# Patient Record
Sex: Male | Born: 1955 | Race: White | Hispanic: No | Smoking: Never smoker
Health system: Southern US, Community
[De-identification: ages and names within clinical notes are randomized; demographics above are authoritative.]

## PROBLEM LIST (undated history)

## (undated) DIAGNOSIS — K219 Gastro-esophageal reflux disease without esophagitis: Secondary | ICD-10-CM

## (undated) DIAGNOSIS — G4700 Insomnia, unspecified: Secondary | ICD-10-CM

## (undated) DIAGNOSIS — R7303 Prediabetes: Secondary | ICD-10-CM

## (undated) DIAGNOSIS — F329 Major depressive disorder, single episode, unspecified: Secondary | ICD-10-CM

## (undated) DIAGNOSIS — E119 Type 2 diabetes mellitus without complications: Secondary | ICD-10-CM

## (undated) DIAGNOSIS — F419 Anxiety disorder, unspecified: Secondary | ICD-10-CM

## (undated) DIAGNOSIS — M1990 Unspecified osteoarthritis, unspecified site: Secondary | ICD-10-CM

## (undated) DIAGNOSIS — Z973 Presence of spectacles and contact lenses: Secondary | ICD-10-CM

## (undated) DIAGNOSIS — F32A Depression, unspecified: Secondary | ICD-10-CM

## (undated) HISTORY — PX: VASECTOMY: SHX75

## (undated) HISTORY — PX: KNEE ARTHROSCOPY W/ MENISCAL REPAIR: SHX1877

## (undated) HISTORY — DX: Unspecified osteoarthritis, unspecified site: M19.90

## (undated) HISTORY — DX: Anxiety disorder, unspecified: F41.9

## (undated) HISTORY — PX: ROTATOR CUFF REPAIR: SHX139

---

## 2000-12-27 ENCOUNTER — Ambulatory Visit (HOSPITAL_BASED_OUTPATIENT_CLINIC_OR_DEPARTMENT_OTHER): Admission: RE | Admit: 2000-12-27 | Discharge: 2000-12-27 | Payer: Self-pay | Admitting: Orthopedic Surgery

## 2002-03-14 ENCOUNTER — Encounter (INDEPENDENT_AMBULATORY_CARE_PROVIDER_SITE_OTHER): Payer: Self-pay | Admitting: Specialist

## 2002-03-14 ENCOUNTER — Ambulatory Visit (HOSPITAL_BASED_OUTPATIENT_CLINIC_OR_DEPARTMENT_OTHER): Admission: RE | Admit: 2002-03-14 | Discharge: 2002-03-14 | Payer: Self-pay | Admitting: Urology

## 2008-09-12 ENCOUNTER — Ambulatory Visit: Payer: Self-pay | Admitting: Internal Medicine

## 2009-01-09 ENCOUNTER — Ambulatory Visit: Payer: Self-pay | Admitting: Internal Medicine

## 2009-01-23 ENCOUNTER — Encounter: Payer: Self-pay | Admitting: Internal Medicine

## 2009-01-23 ENCOUNTER — Ambulatory Visit: Payer: Self-pay | Admitting: Internal Medicine

## 2009-02-02 ENCOUNTER — Encounter: Payer: Self-pay | Admitting: Internal Medicine

## 2011-05-13 NOTE — Op Note (Signed)
[place]. [facility]  Patient:    Brandon Vargas, Brandon Vargas                          MRN: [id number] Proc. Date: 12/27/00 Adm. Date:  [id number] Attending:  Milly Jakob                           Operative Report  PREOPERATIVE DIAGNOSIS:  Medial meniscus tear.  POSTOPERATIVE DIAGNOSES: 1. Medial meniscus tear. 2. Chondromalacia and femoral trochlea.  OPERATION: 1. Partial medial meniscectomy. 2. Debridement of chondromalacia of the femoral trochlea.  SURGEON:  Harvie Junior, M.D.  ASSISTANT:  Currie Paris. Thedore Mins.  ANESTHESIA:  General.  INDICATIONS:  This is a 55 year old with a long history of having had a popping type injury into this knee.  He was having intermittent locking and catching.  He was having inability to run even a short distance because of pain.  He ultimately had an injection which did not relieve his pain.  If anything, it made it a little bit worse.  He tried physical therapy with no results and because of that ultimately had an MRI done which showed that he had a radial type meniscal tear.  We talked about treatment options and ultimately he was brought to the operating room for fixation.  DESCRIPTION OF PROCEDURE:  The patient was brought to the operating room and after adequate anesthesia was obtained with general anesthesia, the patient was placed supine on the operating room table.  The leg was then prepped and draped in the usual sterile fashion and following routine arthroscopic examination, the knee revealed that there was an obvious tear of the posterior horn of the medial meniscus.  This was a radial type tear which came around to the mid body of the medial meniscus.  This was debrided with a combination of straight biting forceps, upbiting forceps and the remaining meniscal rim was c contoured down with a suction shaver.  Following this, attention was turned to the anterior cruciate which was normal.  Attention was turned to  the lateral side which was also normal.  Attention was then turned up into the patellofemoral joint where there was some tendency toward lateral tracking, certainly not dramatic but there was some fairly significant chondromalacia of the femoral trochlea.  This was debrided with a suction shaver back to a stable rim.  Following this, attention was turned back to the medial side where a final check was made to make sure there was not anything that was catching into their side and there was no loose fragment.  At this point, the knee was suctioned dry and the arthroscopic portals were closed with Steri-Strips, and a sterile compressive dressing was applied.  The patient was taken to the recovery room where she was noted to be in satisfactory condition.  Estimated blood loss for the procedure was negligible. DD:  12/27/00 TD:  12/27/00 Job: [id number]

## 2011-05-13 NOTE — Op Note (Signed)
[facility]  Patient:    Brandon Vargas, Brandon Vargas Visit Number: [id number] MRN: [id number]          Service Type: NES Location: [facility]C Attending Physician:  Laqueta Jean Dictated by:   Vonzell Schlatter Patsi Sears, M.D. Proc. Date: 03/14/02 Admit Date:  03/14/2002                             Operative Report  PREOPERATIVE DIAGNOSES:  Right scrotal mass and elective sterilization.  POSTOPERATIVE DIAGNOSES:  Right scrotal mass and elective sterilization.  OPERATION PERFORMED:  Right epididymal cystectomy, right epididymectomy, bilateral vasectomy.  SURGEON:  Sigmund I. Patsi Sears, M.D.  ANESTHESIA:  General endotracheal.  PREPARATION:  After appropriate preanesthesia, the patient was brought to the operating room and placed on the operating table in dorsal supine position where general LMA anesthesia was introduced. It was switched to endotracheal anesthesia because of the ineffectiveness of the LMA to allow ventilation of the patient. The right hemiscrotum was then prepped with Betadine solution and draped in the usual fashion.  DESCRIPTION OF PROCEDURE:  A 6 cm right hemiscrotal incision was made in the subcutaneous tissue, dissected with the electrosurgical unit. The testicle was delivered in the wound, and a very large 5 cm x 5 cm right epididymal cystic mass was identified. This required complete epididymectomy and this was accomplished with great care. The testicle was salvaged, and the blood vessels to the right testicle was identified, and saved. The entire cyst was excised, vasectomy accomplished with cauterization of the vas, and ligation with 2-0 Vicryl suture.  A Penrose drain was placed through a separate stab wound and sutured in place with 3-0 Vicryl suture. The testicle were placed in the wound and the wound was irrigated with antibiotic irrigation. The wound was then closed in two layers with 3-0 Vicryl running suture. A collodion  dressing was applied.  Attention was then directed to the left hemiscrotum, where the vas was identified in a thickened scrotum. A 1 cm left hemiscrotal incision was made, subcutaneous tissue dissected with the electrosurgical unit. The vas was then clamped and brought into the wound. Marcaine was placed in both proximal and distal portions of the vas, and the vas was dissected, and doubly clamped and a portion excised and sent to the laboratory. Each end was cauterized, ligated with 2-0 Vicryl suture. The edges of the vas were then over sewn with 3-0 Vicryl suture, the wound was closed in two layers with 3-0 Vicryl running suture. A collodion dressing was applied, the patient was then awakened and taken to the recovery room in excellent condition.  Dictated by:   Vonzell Schlatter Patsi Sears, M.D. Attending Physician:  Laqueta Jean DD:  03/14/02 TD:  03/15/02 Job: [id number]

## 2014-01-01 ENCOUNTER — Encounter: Payer: Self-pay | Admitting: Internal Medicine

## 2014-07-10 ENCOUNTER — Encounter: Payer: Self-pay | Admitting: Internal Medicine

## 2014-09-24 ENCOUNTER — Encounter: Payer: Self-pay | Admitting: Internal Medicine

## 2014-11-25 ENCOUNTER — Encounter: Payer: Self-pay | Admitting: Internal Medicine

## 2014-12-26 HISTORY — PX: COLONOSCOPY: SHX174

## 2014-12-31 ENCOUNTER — Ambulatory Visit: Payer: BLUE CROSS/BLUE SHIELD | Admitting: *Deleted

## 2014-12-31 VITALS — Ht 73.0 in | Wt 191.0 lb

## 2014-12-31 DIAGNOSIS — Z860100 Personal history of colon polyps, unspecified: Secondary | ICD-10-CM

## 2014-12-31 DIAGNOSIS — Z800 Family history of malignant neoplasm of digestive organs: Secondary | ICD-10-CM

## 2014-12-31 MED ORDER — MOVIPREP 100 G PO SOLR: ORAL | Status: DC

## 2014-12-31 NOTE — Progress Notes (Signed)
Patient denies any allergies to eggs or soy. Patient denies any problems with anesthesia/sedation. Patient denies any oxygen use at home and does not take any diet/weight loss medications. EMMI education assisgned to patient on colonoscopy, this was explained and instructions given to patient. 

## 2015-01-12 ENCOUNTER — Telehealth: Payer: Self-pay | Admitting: Internal Medicine

## 2015-01-12 NOTE — Telephone Encounter (Signed)
Spoke with pt, who states he was given a "4 liter container with powder already mixed into it." There is not a one liter container, with 2 A and B packets.  I spoke with [facility], who states that pt was given Go-Lytely instead of Moviprep.  She will order the correct prep and pt can bring back Go-Lytely and get Moviprep  I spoke with pt and he is aware to pick up Moviprep after 3:00 tomorrow

## 2015-01-13 ENCOUNTER — Encounter: Payer: Self-pay | Admitting: Internal Medicine

## 2015-01-14 ENCOUNTER — Ambulatory Visit (AMBULATORY_SURGERY_CENTER): Payer: BLUE CROSS/BLUE SHIELD | Admitting: Internal Medicine

## 2015-01-14 ENCOUNTER — Encounter: Payer: Self-pay | Admitting: Internal Medicine

## 2015-01-14 VITALS — BP 109/70 | HR 59 | Temp 95.7°F | Resp 30 | Ht 73.0 in | Wt 191.0 lb

## 2015-01-14 DIAGNOSIS — Z800 Family history of malignant neoplasm of digestive organs: Secondary | ICD-10-CM

## 2015-01-14 DIAGNOSIS — D120 Benign neoplasm of cecum: Secondary | ICD-10-CM

## 2015-01-14 DIAGNOSIS — Z86010 Personal history of colonic polyps: Secondary | ICD-10-CM

## 2015-01-14 MED ORDER — SODIUM CHLORIDE 0.9 % IV SOLN: 500.0000 mL | INTRAVENOUS | Status: DC

## 2015-01-14 NOTE — Op Note (Signed)
[place]  [address]. [place], [address]   COLONOSCOPY PROCEDURE REPORT  PATIENT: Brandon Vargas, Brandon Vargas  MR#: [id number] BIRTHDATE: September 11, 1956 , 58  yrs. old GENDER: male ENDOSCOPIST: Lafayette Dragon, MD REFERRED FI:EPPIRJJ Aguiar, M.D. PROCEDURE DATE:  01/14/2015 PROCEDURE:   Colonoscopy with snare polypectomy First Screening Colonoscopy - Avg.  risk and is 50 yrs.  old or older - No.  Prior Negative Screening - Now for repeat screening. N/A  History of Adenoma - Now for follow-up colonoscopy & has been > or = to 3 yrs.  Yes hx of adenoma.  Has been 3 or more years since last colonoscopy.  Polyps Removed Today? Yes. ASA CLASS:   Class I INDICATIONS:adenomatous polyps removed in 1999, 2004 and in January 2010.  Positive family history of colon cancer in patient's father.  MEDICATIONS: Monitored anesthesia care and Propofol 200 mg IV  DESCRIPTION OF PROCEDURE:   After the risks benefits and alternatives of the procedure were thoroughly explained, informed consent was obtained.  The digital rectal exam revealed no abnormalities of the rectum.   The LB [id number]  endoscope was introduced through the anus and advanced to the cecum, which was identified by both the appendix and ileocecal valve. No adverse events experienced.   The quality of the prep was good, using MoviPrep  The instrument was then slowly withdrawn as the colon was fully examined.      COLON FINDINGS: A polypoid shaped sessile polyp measuring 8 mm in size was found at the cecum.  A polypectomy was performed with a cold snare.  Retroflexed views revealed no abnormalities. The time to cecum=4 minutes 43 seconds.  Withdrawal time=6 minutes 19 seconds.  The scope was withdrawn and the procedure completed. COMPLICATIONS: There were no immediate complications.  ENDOSCOPIC IMPRESSION: Sessile polyp was found at the cecum; polypectomy was performed with a cold snare  RECOMMENDATIONS: 1.  Await  pathology results 2.  High-fiber diet recall colonoscopy in 5 years  eSigned:  Lafayette Dragon, MD 01/14/2015 12:04 PM   cc:

## 2015-01-14 NOTE — Progress Notes (Signed)
Called to room to assist during endoscopic procedure.  Patient ID and intended procedure confirmed with present staff. Received instructions for my participation in the procedure from the performing physician.  

## 2015-01-14 NOTE — Progress Notes (Signed)
A/ox3, pleased with MAC, report to RN 

## 2015-01-14 NOTE — Patient Instructions (Signed)
Discharge instructions given. Handout on polyps. Resume previous medications. YOU HAD AN ENDOSCOPIC PROCEDURE TODAY AT [facility]: Refer to the procedure report that was given to you for any specific questions about what was found during the examination.  If the procedure report does not answer your questions, please call your gastroenterologist to clarify.  If you requested that your care partner not be given the details of your procedure findings, then the procedure report has been included in a sealed envelope for you to review at your convenience later.  YOU SHOULD EXPECT: Some feelings of bloating in the abdomen. Passage of more gas than usual.  Walking can help get rid of the air that was put into your GI tract during the procedure and reduce the bloating. If you had a lower endoscopy (such as a colonoscopy or flexible sigmoidoscopy) you may notice spotting of blood in your stool or on the toilet paper. If you underwent a bowel prep for your procedure, then you may not have a normal bowel movement for a few days.  DIET: Your first meal following the procedure should be a light meal and then it is ok to progress to your normal diet.  A half-sandwich or bowl of soup is an example of a good first meal.  Heavy or fried foods are harder to digest and may make you feel nauseous or bloated.  Likewise meals heavy in dairy and vegetables can cause extra gas to form and this can also increase the bloating.  Drink plenty of fluids but you should avoid alcoholic beverages for 24 hours.  ACTIVITY: Your care partner should take you home directly after the procedure.  You should plan to take it easy, moving slowly for the rest of the day.  You can resume normal activity the day after the procedure however you should NOT DRIVE or use heavy machinery for 24 hours (because of the sedation medicines used during the test).    SYMPTOMS TO REPORT IMMEDIATELY: A gastroenterologist can be reached at any  hour.  During normal business hours, 8:30 AM to 5:00 PM Monday through Friday, call [phone].  After hours and on weekends, please call the GI answering service at [phone] who will take a message and have the physician on call contact you.   Following lower endoscopy (colonoscopy or flexible sigmoidoscopy):  Excessive amounts of blood in the stool  Significant tenderness or worsening of abdominal pains  Swelling of the abdomen that is new, acute  Fever of 100F or higher  FOLLOW UP: If any biopsies were taken you will be contacted by phone or by letter within the next 1-3 weeks.  Call your gastroenterologist if you have not heard about the biopsies in 3 weeks.  Our staff will call the home number listed on your records the next business day following your procedure to check on you and address any questions or concerns that you may have at that time regarding the information given to you following your procedure. This is a courtesy call and so if there is no answer at the home number and we have not heard from you through the emergency physician on call, we will assume that you have returned to your regular daily activities without incident.  SIGNATURES/CONFIDENTIALITY: You and/or your care partner have signed paperwork which will be entered into your electronic medical record.  These signatures attest to the fact that that the information above on your After Visit Summary has been reviewed and is  understood.  Full responsibility of the confidentiality of this discharge information lies with you and/or your care-partner.

## 2015-01-15 ENCOUNTER — Telehealth: Payer: Self-pay

## 2015-01-15 NOTE — Telephone Encounter (Signed)
  Follow up Call-  Call back number 01/14/2015  Post procedure Call Back phone  # [phone]  Permission to leave phone message Yes     Patient questions:  Do you have a fever, pain , or abdominal swelling? No. Pain Score  0 *  Have you tolerated food without any problems? Yes.    Have you been able to return to your normal activities? Yes.    Do you have any questions about your discharge instructions: Diet   No. Medications  No. Follow up visit  No.  Do you have questions or concerns about your Care? No.  Actions: * If pain score is 4 or above: No action needed, pain <4.

## 2015-01-21 ENCOUNTER — Encounter: Payer: Self-pay | Admitting: Internal Medicine

## 2017-04-17 DIAGNOSIS — F419 Anxiety disorder, unspecified: Secondary | ICD-10-CM | POA: Insufficient documentation

## 2017-04-17 DIAGNOSIS — N400 Enlarged prostate without lower urinary tract symptoms: Secondary | ICD-10-CM | POA: Insufficient documentation

## 2017-05-29 DIAGNOSIS — R7301 Impaired fasting glucose: Secondary | ICD-10-CM | POA: Insufficient documentation

## 2017-05-29 DIAGNOSIS — R739 Hyperglycemia, unspecified: Secondary | ICD-10-CM | POA: Insufficient documentation

## 2017-05-30 ENCOUNTER — Encounter (HOSPITAL_BASED_OUTPATIENT_CLINIC_OR_DEPARTMENT_OTHER): Payer: Self-pay | Admitting: Respiratory Therapy

## 2017-05-30 ENCOUNTER — Emergency Department (HOSPITAL_BASED_OUTPATIENT_CLINIC_OR_DEPARTMENT_OTHER)
Admission: EM | Admit: 2017-05-30 | Discharge: 2017-05-30 | Disposition: A | Discharge status: Home / Self Care | Payer: 59 | Attending: Emergency Medicine | Admitting: Emergency Medicine

## 2017-05-30 DIAGNOSIS — R05 Cough: Secondary | ICD-10-CM

## 2017-05-30 DIAGNOSIS — R059 Cough, unspecified: Secondary | ICD-10-CM

## 2017-05-30 DIAGNOSIS — Z79899 Other long term (current) drug therapy: Secondary | ICD-10-CM | POA: Insufficient documentation

## 2017-05-30 HISTORY — DX: Depression, unspecified: F32.A

## 2017-05-30 HISTORY — DX: Major depressive disorder, single episode, unspecified: F32.9

## 2017-05-30 HISTORY — DX: Insomnia, unspecified: G47.00

## 2017-05-30 MED ORDER — BENZONATATE 100 MG PO CAPS
100.0000 mg | ORAL_CAPSULE | Freq: Once | ORAL | Status: AC
  Administered 2017-05-30: 100 mg via ORAL
  Filled 2017-05-30: qty 1

## 2017-05-30 MED ORDER — ALBUTEROL SULFATE HFA 108 (90 BASE) MCG/ACT IN AERS
2.0000 | INHALATION_SPRAY | Freq: Four times a day (QID) | RESPIRATORY_TRACT | Status: DC | PRN
  Administered 2017-05-30: 2 via RESPIRATORY_TRACT
  Filled 2017-05-30: qty 6.7

## 2017-05-30 MED ORDER — BENZONATATE 100 MG PO CAPS: 100.0000 mg | ORAL_CAPSULE | Freq: Three times a day (TID) | ORAL | 0 refills | Status: DC | PRN

## 2017-05-30 NOTE — ED Triage Notes (Signed)
Dry "tight" cough x6 days. Denies fever or any other URI sx.

## 2017-05-30 NOTE — ED Notes (Signed)
ED Provider at bedside. 

## 2017-05-30 NOTE — ED Provider Notes (Signed)
[place] DEPT [facility] Provider Note: Georgena Spurling, MD, FACEP  CSN: [id number] MRN: [id number] ARRIVAL: 05/30/17 at 0513 ROOM: [place]  Cough   HISTORY OF PRESENT ILLNESS  Brandon Vargas is a 61 y.o. male with a six-day history of cough. The cough occurs in paroxysms which can last several minutes or more. He describes these paroxysms of severe. He describes the cough as feeling like a tightness in the center of his chest. He has had some occasional wheezing but is not significantly short of breath except during the paroxysms. The cough is nonproductive. He has had no fever or cold symptoms. He has had no nausea, vomiting or diarrhea. He is not having chest pain apart from some mild bilateral lower rib soreness with the cough. He tried Delsym and Robitussin over-the-counter cough suppressants without relief. He saw his physician yesterday and was prescribed Tussionex for use at night. This has not provided adequate relief.   Past Medical History:  Diagnosis Date  . Depression   . Insomnia     Past Surgical History:  Procedure Laterality Date  . KNEE ARTHROSCOPY W/ MENISCAL REPAIR Left   . ROTATOR CUFF REPAIR    . VASECTOMY      Family History  Problem Relation Age of Onset  . Colon cancer Father 24    Social History  Substance Use Topics  . Smoking status: Never Smoker  . Smokeless tobacco: Never Used  . Alcohol use Yes     Comment: Occ beer    Prior to Admission medications   Medication Sig Start Date End Date Taking? Authorizing Provider  Hydrocodone-Chlorpheniramine 5-4 MG/5ML SOLN Take 5 mLs by mouth at bedtime.   Yes [provider]  Multiple Vitamin (MULTIVITAMIN) tablet Take 1 tablet by mouth daily.    [provider]  Multiple Vitamins-Minerals (ZINC PO) Take 1 tablet by mouth daily.    [provider]  Saw Palmetto, Serenoa repens, (SAW PALMETTO PO) Take 1 tablet by mouth daily.    [provider]     Allergies Patient has no known allergies.   REVIEW OF SYSTEMS  Negative except as noted here or in the History of Present Illness.   PHYSICAL EXAMINATION  Initial Vital Signs Blood pressure (!) 142/94, pulse (!) 59, temperature 98 F (36.7 C), temperature source Oral, resp. rate 16, height 6\' 1"  (1.854 m), weight 93.4 kg (206 lb), SpO2 100 %.  Examination General: Well-developed, well-nourished male in no acute distress; appearance consistent with age of record HENT: normocephalic; atraumatic Eyes: pupils equal, round and reactive to light; extraocular muscles intact Neck: supple Heart: regular rate and rhythm Lungs: clear to auscultation bilaterally; frequent dry cough Abdomen: soft; nondistended; nontender; no masses or hepatosplenomegaly; bowel sounds present Extremities: No deformity; full range of motion; pulses normal Neurologic: Awake, alert and oriented; motor function intact in all extremities and symmetric; no facial droop Skin: Warm and dry Psychiatric: Normal mood and affect   RESULTS  Summary of this visit's results, reviewed by myself:   EKG Interpretation  Date/Time:    Ventricular Rate:    PR Interval:    QRS Duration:   QT Interval:    QTC Calculation:   R Axis:     Text Interpretation:        Laboratory Studies: No results found for this or any previous visit (from the past 24 hour(s)). Imaging Studies: No results found.  ED COURSE  Nursing notes and initial vitals signs, including  pulse oximetry, reviewed.  Vitals:   05/30/17 0527 05/30/17 0545  BP: (!) 142/94   Pulse: (!) 59   Resp: 16   Temp: 98 F (36.7 C)   TempSrc: Oral   SpO2: 100% 98%  Weight: 93.4 kg (206 lb)   Height: 6\' 1"  (1.854 m)    5:57 AM Patient given an albuterol inhaler and AeroChamber and instructed in their use by respiratory therapy. We will also prescribe Tessalon Perles. He was advised to discontinue Robitussin or any other over-the-counter cough  suppressant while taking Tussionex. He was advised to take the Tussionex every 12 hours. We will thus be treating him with repeat different classes of drug, albuterol, a narcotic cough suppressant and local anesthetic. He was advised that this pretty much is the limit to our armamentarium against cough. He plans to follow-up with his PCP in 2 days if his cough persists.  PROCEDURES    ED DIAGNOSES     ICD-9-CM ICD-10-CM   1. Cough 786.2 R05        Caylie Sandquist, Jenny Reichmann, MD 05/30/17 [phone]

## 2017-12-24 DIAGNOSIS — B356 Tinea cruris: Secondary | ICD-10-CM | POA: Insufficient documentation

## 2018-04-09 DIAGNOSIS — R972 Elevated prostate specific antigen [PSA]: Secondary | ICD-10-CM | POA: Insufficient documentation

## 2018-11-02 DIAGNOSIS — R269 Unspecified abnormalities of gait and mobility: Secondary | ICD-10-CM | POA: Insufficient documentation

## 2018-11-02 DIAGNOSIS — R2689 Other abnormalities of gait and mobility: Secondary | ICD-10-CM | POA: Insufficient documentation

## 2019-01-02 ENCOUNTER — Encounter: Payer: Self-pay | Admitting: Neurology

## 2019-03-01 NOTE — Progress Notes (Deleted)
[place] [facility] Clinic Note - Initial Visit   Date: 03/01/19  [place] Brandon Vargas MRN: [id number] DOB: Feb 08, 1956   Dear Dr. Ruben Gottron:  Thank you for your kind referral of Brandon Vargas for consultation of imbanace. Although his history is well known to you, please allow [place] to reiterate it for the purpose of our medical record. The patient was accompanied to the clinic by *** who also provides collateral information.     History of Present Illness: Brandon Vargas is a 63 y.o. ***-handed Caucasian/*** male with well-controlled diabetes mellitus, anxiety, and insomnia presenting for evaluation of imbalance.   Starting around the summer of 2019, he began having sensation of imbalance described as ***.  He has suffered *** falls.   CT head on 10/29/2018 is normal. He was hesitant to proceed with neurology referral due to anxiety of having a MRI brain and having claustrophobia.   Out-side paper records, electronic medical record, and images have been reviewed where available and summarized as:  Labs 10/03/2018:  HbA1c 6.5, TSH 1.07 CT head 10/29/2018:  Negative noncontrast head CT.   Past Medical History:  Diagnosis Date  . Depression   . Insomnia     Past Surgical History:  Procedure Laterality Date  . KNEE ARTHROSCOPY W/ MENISCAL REPAIR Left   . ROTATOR CUFF REPAIR    . VASECTOMY       Medications:  Outpatient Encounter Medications as of 03/04/2019  Medication Sig  . benzonatate (TESSALON) 100 MG capsule Take 1 capsule (100 mg total) by mouth 3 (three) times daily as needed for cough (swallow whole).  . Hydrocodone-Chlorpheniramine 5-4 MG/5ML SOLN Take 5 mLs by mouth at bedtime.   No facility-administered encounter medications on file as of 03/04/2019.     Allergies: No Known Allergies  Family History: Family History  Problem Relation Age of Onset  . Colon cancer Father 47    Social History: Social History   Tobacco Use  . Smoking status: Never Smoker    . Smokeless tobacco: Never Used  Substance Use Topics  . Alcohol use: Yes    Comment: Occ beer  . Drug use: No   Social History   Social History Narrative  . Not on file    Review of Systems:  CONSTITUTIONAL: No fevers, chills, night sweats, or weight loss.  *** EYES: No visual changes or eye pain ENT: No hearing changes.  No history of nose bleeds.   RESPIRATORY: No cough, wheezing and shortness of breath.   CARDIOVASCULAR: Negative for chest pain, and palpitations.   GI: Negative for abdominal discomfort, blood in stools or black stools.  No recent change in bowel habits.   GU:  No history of incontinence.   MUSCLOSKELETAL: No history of joint pain or swelling.  No myalgias.   SKIN: Negative for lesions, rash, and itching.   HEMATOLOGY/ONCOLOGY: Negative for prolonged bleeding, bruising easily, and swollen nodes.  No history of cancer.   ENDOCRINE: Negative for cold or heat intolerance, polydipsia or goiter.   PSYCH:  ***depression or anxiety symptoms.   NEURO: As Above.   Vital Signs:  There were no vitals taken for this visit.   General Medical Exam:  *** General:  Well appearing, comfortable.   Eyes/ENT: see cranial nerve examination.   Neck:   No carotid bruits. Respiratory:  Clear to auscultation, good air entry bilaterally.   Cardiac:  Regular rate and rhythm, no murmur.   Extremities:  No deformities, edema, or skin discoloration.  Skin:  No rashes or lesions.  Neurological Exam: MENTAL STATUS including orientation to time, place, person, recent and remote memory, attention span and concentration, language, and fund of knowledge is ***normal.  Speech is not dysarthric.  CRANIAL NERVES: II:  No visual field defects.  Unremarkable fundi.   III-IV-VI: Pupils equal round and reactive to light.  Normal conjugate, extra-ocular eye movements in all directions of gaze.  No nystagmus.  No ptosis***.   V:  Normal facial sensation.    VII:  Normal facial symmetry and  movements.   VIII:  Normal hearing and vestibular function.   IX-X:  Normal palatal movement.   XI:  Normal shoulder shrug and head rotation.   XII:  Normal tongue strength and range of motion, no deviation or fasciculation.  MOTOR:  No atrophy, fasciculations or abnormal movements.  No pronator drift.   Upper Extremity:  Right  Left  Deltoid  5/5   5/5   Biceps  5/5   5/5   Triceps  5/5   5/5   Infraspinatus 5/5  5/5  Medial pectoralis 5/5  5/5  Wrist extensors  5/5   5/5   Wrist flexors  5/5   5/5   Finger extensors  5/5   5/5   Finger flexors  5/5   5/5   Dorsal interossei  5/5   5/5   Abductor pollicis  5/5   5/5   Tone (Ashworth scale)  0  0   Lower Extremity:  Right  Left  Hip flexors  5/5   5/5   Hip extensors  5/5   5/5   Adductor 5/5  5/5  Abductor 5/5  5/5  Knee flexors  5/5   5/5   Knee extensors  5/5   5/5   Dorsiflexors  5/5   5/5   Plantarflexors  5/5   5/5   Toe extensors  5/5   5/5   Toe flexors  5/5   5/5   Tone (Ashworth scale)  0  0   MSRs:  Right                                                                 Left brachioradialis 2+  brachioradialis 2+  biceps 2+  biceps 2+  triceps 2+  triceps 2+  patellar 2+  patellar 2+  ankle jerk 2+  ankle jerk 2+  Hoffman no  Hoffman no  plantar response down  plantar response down   SENSORY:  Normal and symmetric perception of light touch, pinprick, vibration, and proprioception.  Romberg's sign absent.   COORDINATION/GAIT: Normal finger-to- nose-finger and heel-to-shin.  Intact rapid alternating movements bilaterally.  Able to rise from a chair without using arms.  Gait narrow based and stable. Tandem and stressed gait intact.    IMPRESSION: ***  PLAN/RECOMMENDATIONS:  *** Return to clinic in *** months.    Thank you for allowing me to participate in patient's care.  If I can answer any additional questions, I would be pleased to do so.    Sincerely,    Donika K. Posey Pronto, DO

## 2019-03-04 ENCOUNTER — Ambulatory Visit: Payer: BLUE CROSS/BLUE SHIELD | Admitting: Neurology

## 2019-03-18 ENCOUNTER — Ambulatory Visit: Payer: BLUE CROSS/BLUE SHIELD | Admitting: Neurology

## 2019-05-27 ENCOUNTER — Ambulatory Visit (INDEPENDENT_AMBULATORY_CARE_PROVIDER_SITE_OTHER): Payer: Medicare Other | Admitting: Neurology

## 2019-05-27 ENCOUNTER — Encounter: Payer: Self-pay | Admitting: Neurology

## 2019-05-27 ENCOUNTER — Other Ambulatory Visit: Payer: Self-pay | Admitting: Neurology

## 2019-05-27 ENCOUNTER — Other Ambulatory Visit: Payer: Self-pay

## 2019-05-27 ENCOUNTER — Telehealth: Payer: Self-pay

## 2019-05-27 ENCOUNTER — Other Ambulatory Visit: Payer: Medicare Other

## 2019-05-27 VITALS — BP 118/82 | HR 57 | Temp 97.9°F | Ht 73.0 in | Wt 196.0 lb

## 2019-05-27 DIAGNOSIS — R6889 Other general symptoms and signs: Secondary | ICD-10-CM

## 2019-05-27 DIAGNOSIS — R292 Abnormal reflex: Secondary | ICD-10-CM

## 2019-05-27 DIAGNOSIS — G959 Disease of spinal cord, unspecified: Secondary | ICD-10-CM

## 2019-05-27 DIAGNOSIS — R258 Other abnormal involuntary movements: Secondary | ICD-10-CM

## 2019-05-27 NOTE — Patient Instructions (Addendum)
MRI brain and cervical spine without contrast will be order under anesthesia    Check labs Your provider has requested that you have labwork completed today. Please go to [facility] ([address]) on the second floor of this building before leaving the office today. You do not need to check in. If you are not called within 15 minutes please check with the front desk.    We will call you with the results

## 2019-05-27 NOTE — Progress Notes (Signed)
[facility] Clinic Note - Initial Visit   Date: 05/27/19  Brandon Vargas MRN: [id number] DOB: 02-11-1956   Dear Dr. Ruben Gottron:  Thank you for your kind referral of Brandon Vargas for consultation of imbalance. Although his history is well known to you, please allow [place] to reiterate it for the purpose of our medical record. The patient was accompanied to the clinic by self.   History of Present Illness: Brandon Vargas is a 63 y.o. right-handed Caucasian male with diabetes mellitus (HbA1c 6.5), and anxiety/depression presenting for evaluation of gait imbalance.   Starting around 65-month ago, he began having sensation of unsteadiness with walking, which is worse when he first wakes up.  He walks unassisted and has not suffered any falls.  Symptoms are episodic throughout the day, with no exacerbating or alleviating factors.  He does not have leg weakness, arm weakness, muscle cramps, or bowel/bladder problems.  CT head from 11/08/2018 was unremarkable.  He has severe claustrophobia and is not interested in having MRI.  Past Medical History:  Diagnosis Date  . Depression   . Insomnia     Past Surgical History:  Procedure Laterality Date  . KNEE ARTHROSCOPY W/ MENISCAL REPAIR Left   . ROTATOR CUFF REPAIR    . VASECTOMY       Medications:  Outpatient Encounter Medications as of 05/27/2019  Medication Sig  . Hydrocodone-Chlorpheniramine 5-4 MG/5ML SOLN Take 5 mLs by mouth at bedtime.  . sertraline (ZOLOFT) 100 MG tablet Take 100 mg by mouth as directed. Take 532mdaily  . [DISCONTINUED] benzonatate (TESSALON) 100 MG capsule Take 1 capsule (100 mg total) by mouth 3 (three) times daily as needed for cough (swallow whole).   No facility-administered encounter medications on file as of 05/27/2019.     Allergies: No Known Allergies  Family History: Family History  Problem Relation Age of Onset  . Lung cancer Mother   . Colon cancer Father 4842. Diabetes Father    . Cancer - Colon Father   . Diabetes Sister   . Diabetes Brother   . Lung cancer Brother     Social History: Social History   Tobacco Use  . Smoking status: Never Smoker  . Smokeless tobacco: Never Used  Substance Use Topics  . Alcohol use: Yes    Comment: 1-2 beer a few times per month  . Drug use: Yes    Types: Marijuana   Social History   Social History Narrative   Right handed   Lives in a two story home with 1 son   He is retired from working as a PaLicensed conveyancer    Review of Systems:  CONSTITUTIONAL: No fevers, chills, night sweats, or weight loss.   EYES: No visual changes or eye pain ENT: No hearing changes.  No history of nose bleeds.   RESPIRATORY: No cough, wheezing and shortness of breath.   CARDIOVASCULAR: Negative for chest pain, and palpitations.   GI: Negative for abdominal discomfort, blood in stools or black stools.  No recent change in bowel habits.   GU:  No history of incontinence.   MUSCLOSKELETAL: No history of joint pain or swelling.  No myalgias.   SKIN: Negative for lesions, rash, and itching.   HEMATOLOGY/ONCOLOGY: Negative for prolonged bleeding, bruising easily, and swollen nodes.  No history of cancer.   ENDOCRINE: Negative for cold or heat intolerance, polydipsia or goiter.   PSYCH:  +depression or anxiety symptoms.   NEURO: As  Above.   Vital Signs:  BP 118/82   Pulse (!) 57   Temp 97.9 F (36.6 C)   Ht 6' 1" (1.854 m)   Wt 196 lb (88.9 kg)   SpO2 99%   BMI 25.86 kg/m    General Medical Exam:   General:  Depressed appearing, comfortable.   Eyes/ENT: see cranial nerve examination.   Neck:   No carotid bruits. Respiratory:  Clear to auscultation, good air entry bilaterally.   Cardiac:  Regular rate and rhythm, no murmur.   Extremities:  No deformities, edema, or skin discoloration.  Skin:  No rashes or lesions.  Neurological Exam: MENTAL STATUS including orientation to time, place, person, recent and remote memory,  attention span and concentration, language, and fund of knowledge is normal.  Speech is not dysarthric.  Paucity of speech.   CRANIAL NERVES: II:  No visual field defects.  Unremarkable fundi.   III-IV-VI: Pupils equal round and reactive to light.  Normal conjugate, extra-ocular eye movements in all directions of gaze, except restricted upgaze bilaterally.  No nystagmus.  No ptosis.   V:  Normal facial sensation.    VII:  Normal facial symmetry and movements.   VIII:  Normal hearing and vestibular function.   IX-X:  Normal palatal movement.   XI:  Normal shoulder shrug and head rotation.   XII:  Normal tongue strength and range of motion, no deviation or fasciculation.  MOTOR:  No atrophy, fasciculations or abnormal movements.  No pronator drift.   Upper Extremity:  Right  Left  Deltoid  5/5   5/5   Biceps  5/5   5/5   Triceps  5/5   5/5   Infraspinatus 5/5  5/5  Medial pectoralis 5/5  5/5  Wrist extensors  5/5   5/5   Wrist flexors  5/5   5/5   Finger extensors  5/5   5/5   Finger flexors  5/5   5/5   Dorsal interossei  5/5   5/5   Abductor pollicis  5/5   5/5   Tone (Ashworth scale)  0+  0+   Lower Extremity:  Right  Left  Hip flexors  5/5   5/5   Hip extensors  5/5   5/5   Adductor 5/5  5/5  Abductor 5/5  5/5  Knee flexors  5/5   5/5   Knee extensors  5/5   5/5   Dorsiflexors  5/5   5/5   Plantarflexors  5/5   5/5   Toe extensors  5/5   5/5   Toe flexors  5/5   5/5   Tone (Ashworth scale)  0+  0+   MSRs:  Right        Left                  brachioradialis 1+  1+  biceps 3+  3+  triceps 3+  3+  patellar 3+  3+  ankle jerk 3+  3+  Hoffman yes  yes  plantar response up  up  Clonus is present in the feet and hands bilaterally  SENSORY:  Normal and symmetric perception of light touch, pinprick, vibration, and proprioception.    COORDINATION/GAIT: Normal finger-to- nose-finger.  Intact rapid alternating movements bilaterally.  Gait appears mildly wide based, slow,  stable.     IMPRESSION: Gait instability in the setting of prominent upper motor neuron findings (spasticity, clonus, hyperreflexia) warrants evaluation for structural disease of the cervical cord and  brain.   PLAN/RECOMMENDATIONS:  MRI brain and cervical spine without contrast.  Due to severity of claustrophobia, he will require general anesthesia Check ESR, CRP, copper, vitamin 12, zinc, MMA for myelopathy Further recommendations pending results.   Thank you for allowing me to participate in patient's care.  If I can answer any additional questions, I would be pleased to do so.    Sincerely,    Ivy Meriwether K. Posey Pronto, DO

## 2019-05-27 NOTE — Addendum Note (Signed)
Addended by: Kaylyn Lim I on: 05/27/2019 03:58 PM   Modules accepted: Orders

## 2019-05-27 NOTE — Telephone Encounter (Signed)
[facility] orders for Neurology.

## 2019-05-27 NOTE — Addendum Note (Signed)
Addended by: Kaylyn Lim I on: 05/27/2019 04:04 PM   Modules accepted: Orders

## 2019-05-27 NOTE — Addendum Note (Signed)
Addended by: Kaylyn Lim I on: 05/27/2019 04:00 PM   Modules accepted: Orders

## 2019-05-27 NOTE — Addendum Note (Signed)
Addended by: Kaylyn Lim I on: 05/27/2019 04:08 PM   Modules accepted: Orders

## 2019-05-27 NOTE — Addendum Note (Signed)
Addended by: Kaylyn Lim I on: 05/27/2019 04:25 PM   Modules accepted: Orders

## 2019-05-28 LAB — SEDIMENTATION RATE: Sed Rate: 2 mm/h (ref 0–20)

## 2019-05-28 LAB — VITAMIN B12: Vitamin B-12: 365 pg/mL (ref 200–1100)

## 2019-05-28 LAB — C-REACTIVE PROTEIN: CRP: 1.3 mg/L (ref <8.0)

## 2019-05-29 LAB — COPPER, SERUM: Copper: 99 ug/dL (ref 70–175)

## 2019-05-31 LAB — METHYLMALONIC ACID, SERUM: Methylmalonic Acid, Quant: 132 nmol/L (ref 87–318)

## 2019-06-06 LAB — ZINC: Zinc: 95 ug/dL (ref 60–130)

## 2019-06-10 DIAGNOSIS — Z13220 Encounter for screening for lipoid disorders: Secondary | ICD-10-CM | POA: Diagnosis not present

## 2019-06-10 DIAGNOSIS — Z0000 Encounter for general adult medical examination without abnormal findings: Secondary | ICD-10-CM | POA: Diagnosis not present

## 2019-06-10 DIAGNOSIS — Z125 Encounter for screening for malignant neoplasm of prostate: Secondary | ICD-10-CM | POA: Diagnosis not present

## 2019-06-14 ENCOUNTER — Telehealth: Payer: Self-pay | Admitting: Neurology

## 2019-06-14 NOTE — Telephone Encounter (Signed)
Tasha at [facility] called to inform Brandon Vargas pt refused MRI with anesthesia appts at 6 am for 8 am. This is the time they do MRI with anesthia.

## 2019-06-17 NOTE — Telephone Encounter (Signed)
I had offered him open MRI with valium, which he refused.  He was only willing to do MRI under anesthesia.  If he cannot have the imaging done at the times available, options are limited.    We can order CT cervical spine wo contrast.  Please explain that CT will give limited results and looks and looks at bones, moreso than nerves, but that is the only other testing to assess the spinal cord.

## 2019-06-17 NOTE — Telephone Encounter (Signed)
Dr. Posey Pronto,  Can the pt take something prior to the scan to help him relax? I will call him and make sure he has a driver if you think that would help.

## 2019-06-18 NOTE — Telephone Encounter (Signed)
Spoke with pt.  He does not want to do any scans at this time. He states that he will do with out.  I explained to him his options again and he does not want to proceed with any test.

## 2019-06-18 NOTE — Telephone Encounter (Signed)
Noted  

## 2019-07-02 DIAGNOSIS — E1165 Type 2 diabetes mellitus with hyperglycemia: Secondary | ICD-10-CM | POA: Diagnosis not present

## 2019-07-02 DIAGNOSIS — R7301 Impaired fasting glucose: Secondary | ICD-10-CM | POA: Diagnosis not present

## 2019-07-08 DIAGNOSIS — E1165 Type 2 diabetes mellitus with hyperglycemia: Secondary | ICD-10-CM | POA: Diagnosis not present

## 2019-07-08 DIAGNOSIS — R972 Elevated prostate specific antigen [PSA]: Secondary | ICD-10-CM | POA: Diagnosis not present

## 2019-07-08 DIAGNOSIS — F419 Anxiety disorder, unspecified: Secondary | ICD-10-CM | POA: Diagnosis not present

## 2019-08-05 DIAGNOSIS — R2689 Other abnormalities of gait and mobility: Secondary | ICD-10-CM | POA: Diagnosis not present

## 2019-08-05 DIAGNOSIS — F419 Anxiety disorder, unspecified: Secondary | ICD-10-CM | POA: Diagnosis not present

## 2019-08-05 DIAGNOSIS — E1165 Type 2 diabetes mellitus with hyperglycemia: Secondary | ICD-10-CM | POA: Diagnosis not present

## 2019-08-05 DIAGNOSIS — R269 Unspecified abnormalities of gait and mobility: Secondary | ICD-10-CM | POA: Diagnosis not present

## 2019-08-27 ENCOUNTER — Telehealth: Payer: Self-pay | Admitting: Neurology

## 2019-08-27 NOTE — Telephone Encounter (Signed)
Patient states that he would like to have [place] send orders over for a MRI

## 2019-08-27 NOTE — Telephone Encounter (Signed)
Please advise on this, VV? To see what is acutually needed or schedule

## 2019-08-27 NOTE — Telephone Encounter (Signed)
See telephone note on 6/19.  Patient requested MRI under general anesthesia, but was not available during the times they offered it (6am or 8am) and refused to do it with valium.  Is he willing to do MRI brain and cervical spine with mild sedative (valium)?  Or is he requesting general anesthesia?  He will need an updated office visit, if he needs to have anesthesia, as it can only be done if he has been seen in the office in the last 30-days.

## 2019-08-28 NOTE — Telephone Encounter (Signed)
Left message to call office

## 2019-08-29 ENCOUNTER — Telehealth: Payer: Self-pay

## 2019-08-29 NOTE — Telephone Encounter (Signed)
No answer again

## 2019-08-29 NOTE — Telephone Encounter (Signed)
Patient is going to schedule Office visit, he isnt sure, what he wants to do anesthesia or meds, so I advised to schedule visit.

## 2019-09-12 ENCOUNTER — Encounter: Payer: Self-pay | Admitting: Neurology

## 2019-09-13 ENCOUNTER — Other Ambulatory Visit: Payer: Self-pay

## 2019-09-13 ENCOUNTER — Encounter: Payer: Self-pay | Admitting: Neurology

## 2019-09-13 ENCOUNTER — Ambulatory Visit (INDEPENDENT_AMBULATORY_CARE_PROVIDER_SITE_OTHER): Payer: Medicare HMO | Admitting: Neurology

## 2019-09-13 VITALS — BP 130/64 | HR 65 | Ht 73.0 in | Wt 204.0 lb

## 2019-09-13 DIAGNOSIS — R292 Abnormal reflex: Secondary | ICD-10-CM

## 2019-09-13 DIAGNOSIS — G959 Disease of spinal cord, unspecified: Secondary | ICD-10-CM | POA: Diagnosis not present

## 2019-09-13 NOTE — Progress Notes (Signed)
Follow-up Visit   Date: 09/13/19   Brandon Vargas MRN: [id number] DOB: [birth year]   Interim History: Brandon Vargas is a 63 y.o. right-handed Caucasian male with diabetes mellitus (HbA1c 6.5) and anxiety/depression returning to the clinic for follow-up of gait imbalance.  The patient was accompanied to the clinic by self.  History of present illness: Starting early 2020, he began having sensation of unsteadiness with walking, which is worse when he first wakes up.  He walks unassisted and has not suffered any falls.  Symptoms are episodic throughout the day, with no exacerbating or alleviating factors.  He does not have leg weakness, arm weakness, muscle cramps, or bowel/bladder problems.  CT head from 11/08/2018 was unremarkable.  He has severe claustrophobia and is not interested in having MRI.  UPDATE 09/13/2019:  He is here with ongoing complaints of imbalance.  He says that he feels as if he is drunk when walking.  He denies any leg weakness, cramps, numbness or tingling.  He is starting to fall more frequently.  He walks unassisted.  At his last visit, I ordered MRI brain and cervical spine under general anesthesia, due to patient's severe claustrophobia.  When radiology contacted patient to schedule his appointment, patient did not proceed as he did not want an early morning appointment.  He is now agreeable to reconsidering this.  Medications:  Current Outpatient Medications on File Prior to Visit  Medication Sig Dispense Refill  . glucose blood test strip Please specify directions, refills and quantity    . hydrOXYzine (ATARAX/VISTARIL) 10 MG tablet PLEASE SEE ATTACHED FOR DETAILED DIRECTIONS    . metFORMIN (GLUCOPHAGE-XR) 500 MG 24 hr tablet TAKE 1 TABLET (500 MG TOTAL) BY MOUTH 2 TIMES DAILY    . sertraline (ZOLOFT) 100 MG tablet Take 100 mg by mouth as directed. Take 32m daily    . Hydrocodone-Chlorpheniramine 5-4 MG/5ML SOLN Take 5 mLs by mouth at bedtime.     No current  facility-administered medications on file prior to visit.     Allergies: No Known Allergies  Review of Systems:  CONSTITUTIONAL: No fevers, chills, night sweats, or weight loss.  EYES: No visual changes or eye pain ENT: No hearing changes.  No history of nose bleeds.   RESPIRATORY: No cough, wheezing and shortness of breath.   CARDIOVASCULAR: Negative for chest pain, and palpitations.   GI: Negative for abdominal discomfort, blood in stools or black stools.  No recent change in bowel habits.   GU:  No history of incontinence.   MUSCLOSKELETAL: No history of joint pain or swelling.  No myalgias.   SKIN: Negative for lesions, rash, and itching.   ENDOCRINE: Negative for cold or heat intolerance, polydipsia or goiter.   PSYCH:  No depression or anxiety symptoms.   NEURO: As Above.   Vital Signs:  BP 130/64   Pulse 65   Ht _0  (1.854 m)   Wt 204 lb (92.5 kg)   SpO2 95%   BMI 26.91 kg/m    General Medical Exam:   General:  Well appearing, comfortable  Eyes/ENT: see cranial nerve examination.   Neck:  No carotid bruits. Respiratory:  Clear to auscultation, good air entry bilaterally.   Cardiac:  Regular rate and rhythm, no murmur.   Ext:  No edema   Neurological Exam: MENTAL STATUS including orientation to time, place, person, recent and remote memory, attention span and concentration, language, and fund of knowledge is normal.  Speech is not dysarthric.  CRANIAL NERVES:  No visual field defects.  Pupils equal round and reactive to light.  Normal conjugate, extra-ocular eye movements in all directions of gaze, except restricted upgaze.  No ptosis.  Face is symmetric.  MOTOR:  Motor strength is 5/5 in all extremities.  No atrophy, fasciculations or abnormal movements.  No pronator drift.  Tone is mildly increased throughout (0+) and LUE is 1.    MSRs:                                           Right        Left brachioradialis 1+  1+  biceps 3+  3+  triceps 3+  3+  patellar  3+  3+  ankle jerk 3+  3+  Hoffman yes  yes  plantar response down  up  Bilateral ankle clonus  SENSORY:  Intact to vibration throughout.  COORDINATION/GAIT:  Normal finger-to- nose-finger.  Mildly slowed finger tapping bilaterally.  Gait mildly-wide based and stable.   Data: Labs 05/27/2019:  ESR 2, vitamin B12 365, CRP 1.3, copper 99, zinc 95  IMPRESSION/PLAN: Gait instability with exam showing hyperreflexia and spasticity is suggestive of upper motor neuron disease.  Myelopathy labs are normal.  MRI of the brain and cervical spine under general anesthesia will be ordered again.  Patient was encouraged to keep his scheduled appointment as these studies are done in the early morning.  Further recommendations pending results.   Thank you for allowing me to participate in patient's care.  If I can answer any additional questions, I would be pleased to do so.    Sincerely,    Alyviah Crandle K. Posey Pronto, DO

## 2019-09-13 NOTE — Patient Instructions (Addendum)
MRI brain and cervical spine will be ordered under general anesthesia.  Please contact them at [phone]. [facility] central scheduling

## 2019-09-30 ENCOUNTER — Other Ambulatory Visit (HOSPITAL_COMMUNITY)
Admission: RE | Admit: 2019-09-30 | Discharge: 2019-09-30 | Disposition: A | Discharge status: Home / Self Care | Payer: Medicare HMO | Source: Ambulatory Visit | Attending: Family Medicine | Admitting: Family Medicine

## 2019-09-30 DIAGNOSIS — Z20828 Contact with and (suspected) exposure to other viral communicable diseases: Secondary | ICD-10-CM | POA: Insufficient documentation

## 2019-09-30 DIAGNOSIS — Z01812 Encounter for preprocedural laboratory examination: Secondary | ICD-10-CM | POA: Diagnosis present

## 2019-10-01 LAB — NOVEL CORONAVIRUS, NAA (HOSP ORDER, SEND-OUT TO REF LAB; TAT 18-24 HRS): SARS-CoV-2, NAA: NOT DETECTED

## 2019-10-02 ENCOUNTER — Other Ambulatory Visit: Payer: Self-pay

## 2019-10-02 ENCOUNTER — Encounter (HOSPITAL_COMMUNITY): Payer: Self-pay | Admitting: *Deleted

## 2019-10-02 NOTE — Progress Notes (Signed)
Pt made aware to hold Metformin morning of procedure. Pt made aware to check CBG every 2 hours prior to arrival to hospital on DOS. Pt made aware to treat a CBG < 70 with4 ounces of apple or cranberry juice, wait 15 minutes after intervention to recheck CBG, if CBG remains < 70, call Short Stay unit to speak with a nurse. Pt verbalized understanding of all pre-op instructions.

## 2019-10-02 NOTE — Progress Notes (Signed)
Pt denies SOB, chest pain, and being under the care of a cardiologist. Pt stated that his PCP is Dr. Rolan Lipa. Pt denies having a stress test, echo and cardiac cath. Pt denies having an EKG and chest x ray in the last year. Pt denies recent labs. Pt made aware to stop taking vitamins and herbal medications. Pt verbalized understanding of all pre-op instructions.

## 2019-10-03 ENCOUNTER — Encounter (HOSPITAL_COMMUNITY): Admission: RE | Disposition: A | Discharge status: Home / Self Care | Payer: Self-pay | Source: Home / Self Care

## 2019-10-03 ENCOUNTER — Ambulatory Visit (HOSPITAL_COMMUNITY)
Admission: RE | Admit: 2019-10-03 | Discharge: 2019-10-03 | Disposition: A | Discharge status: Home / Self Care | Payer: Medicare HMO | Source: Ambulatory Visit | Attending: Neurology | Admitting: Neurology

## 2019-10-03 ENCOUNTER — Ambulatory Visit (HOSPITAL_COMMUNITY): Payer: Medicare HMO | Admitting: Certified Registered"

## 2019-10-03 ENCOUNTER — Telehealth: Payer: Self-pay | Admitting: Neurology

## 2019-10-03 ENCOUNTER — Ambulatory Visit (HOSPITAL_COMMUNITY)
Admission: RE | Admit: 2019-10-03 | Discharge: 2019-10-03 | Disposition: A | Discharge status: Home / Self Care | Payer: Medicare HMO | Attending: Neurology | Admitting: Neurology

## 2019-10-03 ENCOUNTER — Encounter (HOSPITAL_COMMUNITY): Payer: Self-pay

## 2019-10-03 ENCOUNTER — Ambulatory Visit (HOSPITAL_COMMUNITY): Admission: RE | Admit: 2019-10-03 | Payer: Medicare HMO | Source: Ambulatory Visit

## 2019-10-03 DIAGNOSIS — M4802 Spinal stenosis, cervical region: Secondary | ICD-10-CM | POA: Diagnosis not present

## 2019-10-03 DIAGNOSIS — G959 Disease of spinal cord, unspecified: Secondary | ICD-10-CM

## 2019-10-03 DIAGNOSIS — Y9389 Activity, other specified: Secondary | ICD-10-CM | POA: Insufficient documentation

## 2019-10-03 DIAGNOSIS — R2681 Unsteadiness on feet: Secondary | ICD-10-CM | POA: Diagnosis present

## 2019-10-03 DIAGNOSIS — R269 Unspecified abnormalities of gait and mobility: Secondary | ICD-10-CM | POA: Diagnosis not present

## 2019-10-03 DIAGNOSIS — Y92538 Other ambulatory health services establishments as the place of occurrence of the external cause: Secondary | ICD-10-CM | POA: Diagnosis not present

## 2019-10-03 DIAGNOSIS — W1830XA Fall on same level, unspecified, initial encounter: Secondary | ICD-10-CM | POA: Diagnosis not present

## 2019-10-03 DIAGNOSIS — E119 Type 2 diabetes mellitus without complications: Secondary | ICD-10-CM | POA: Diagnosis not present

## 2019-10-03 DIAGNOSIS — S0101XA Laceration without foreign body of scalp, initial encounter: Secondary | ICD-10-CM | POA: Insufficient documentation

## 2019-10-03 HISTORY — DX: Prediabetes: R73.03

## 2019-10-03 HISTORY — PX: RADIOLOGY WITH ANESTHESIA: SHX6223

## 2019-10-03 HISTORY — DX: Gastro-esophageal reflux disease without esophagitis: K21.9

## 2019-10-03 HISTORY — DX: Presence of spectacles and contact lenses: Z97.3

## 2019-10-03 LAB — BASIC METABOLIC PANEL
Anion gap: 8 (ref 5–15)
BUN: 16 mg/dL (ref 8–23)
CO2: 24 mmol/L (ref 22–32)
Calcium: 8.9 mg/dL (ref 8.9–10.3)
Chloride: 102 mmol/L (ref 98–111)
Creatinine, Ser: 0.88 mg/dL (ref 0.61–1.24)
GFR calc Af Amer: >60 mL/min (ref >60)
GFR calc non Af Amer: >60 mL/min (ref >60)
Glucose, Bld: 293 mg/dL — ABNORMAL HIGH (ref 70–99)
Potassium: 3.9 mmol/L (ref 3.5–5.1)
Sodium: 134 mmol/L — ABNORMAL LOW (ref 135–145)

## 2019-10-03 LAB — GLUCOSE, CAPILLARY
Glucose-Capillary: 269 mg/dL — ABNORMAL HIGH (ref 70–99)
Glucose-Capillary: 242 mg/dL — ABNORMAL HIGH (ref 70–99)

## 2019-10-03 SURGERY — MRI WITH ANESTHESIA
Anesthesia: General

## 2019-10-03 MED ORDER — LIDOCAINE-EPINEPHRINE 1 %-1:100000 IJ SOLN
INTRAMUSCULAR | Status: AC
  Filled 2019-10-03: qty 1

## 2019-10-03 MED ORDER — INSULIN ASPART 100 UNIT/ML ~~LOC~~ SOLN
4.0000 [IU] | Freq: Once | SUBCUTANEOUS | Status: AC
  Administered 2019-10-03: 4 [IU] via SUBCUTANEOUS

## 2019-10-03 MED ORDER — LIDOCAINE HCL (PF) 1 % IJ SOLN
INTRAMUSCULAR | Status: AC
  Filled 2019-10-03: qty 30

## 2019-10-03 MED ORDER — LIDOCAINE HCL (PF) 2 % IJ SOLN
0.0000 mL | Freq: Once | INTRAMUSCULAR | Status: DC | PRN
  Filled 2019-10-03: qty 20

## 2019-10-03 MED ORDER — LACTATED RINGERS IV SOLN
INTRAVENOUS | Status: DC
  Administered 2019-10-03: 08:00:00 via INTRAVENOUS

## 2019-10-03 NOTE — Telephone Encounter (Signed)
Ray from [facility] called and left a message requesting a call back to give a call-report on this patient's MRI.  Routing to Clinical Pool  Cc: Dr. Posey Pronto for [facility]

## 2019-10-03 NOTE — Anesthesia Procedure Notes (Signed)
Procedure Name: Intubation Date/Time: 10/03/2019 8:34 AM Performed by: Cleda Daub, CRNA Pre-anesthesia Checklist: Patient identified, Emergency Drugs available, Suction available and Patient being monitored Patient Re-evaluated:Patient Re-evaluated prior to induction Oxygen Delivery Method: Circle system utilized Preoxygenation: Pre-oxygenation with 100% oxygen Induction Type: IV induction and Rapid sequence Laryngoscope Size: Mac and 4 Grade View: Grade I Tube type: Oral Tube size: 7.5 mm Airway Equipment and Method: Stylet Placement Confirmation: positive ETCO2,  ETT inserted through vocal cords under direct vision and breath sounds checked- equal and bilateral Secured at: 23 cm Tube secured with: Tape Dental Injury: Teeth and Oropharynx as per pre-operative assessment

## 2019-10-03 NOTE — Discharge Instructions (Signed)
It is ok to shower starting tomorrow  Call [facility] to get an appointment on Oct 13th to come to the office for suture removal.  Ok to use an ice pack if needed

## 2019-10-03 NOTE — Progress Notes (Addendum)
Dr. Ninfa Linden gave patient stitches and scheduled a follow up for stitch removal, consent was given by son over the phone with Arnette Norris, RN as a second witness. Patient began unatattching himself from the monitor as soon as Ninfa Linden walked away, I stayed with patient and told him he wasn't quite ready to go to which he replied, "Your'e not but I am." Assessment remained the same regarding neurological status alert and oriented X4 with impulsiveness, VS stable. Dr. Lanetta Inch came to the bedside and cleared patient for discharge. Myself and a nurse tech stayed with patient and assisted him in getting dressed. Both of [place] took him out in a wheelchair and helped him get into his sons car. I reviewed with his son the information for follow up with Dr. Ninfa Linden about removal of stitches, no questions or concerns from son or patient.  Rowe Pavy, RN

## 2019-10-03 NOTE — Telephone Encounter (Signed)
[facility] called regarding his MRI results and that Dr. Nevada Crane read the MRI and wanted Dr. Posey Pronto to pay close attention to Impression # 1. Thank you

## 2019-10-03 NOTE — Transfer of Care (Signed)
Immediate Anesthesia Transfer of Care Note  Patient: Brandon Vargas  Procedure(s) Performed: MRI WITH ANESTHESIA BRAIN AND CERVICAL SPINE WITHOUT CONTRAST (N/A )  Patient Location: PACU  Anesthesia Type:General  Level of Consciousness: awake, alert , oriented and patient cooperative  Airway & Oxygen Therapy: Patient Spontanous Breathing and Patient connected to face mask oxygen  Post-op Assessment: Report given to RN and Post -op Vital signs reviewed and stable  Post vital signs: Reviewed and stable  Last Vitals:  Vitals Value Taken Time  BP    Temp 36.4 C 10/03/19 1003  Pulse 61 10/03/19 1003  Resp    SpO2      Last Pain:  Vitals:   10/03/19 0726  TempSrc:   PainSc: 0-No pain      Patients Stated Pain Goal: 4 (32/12/24 8250)  Complications: No apparent anesthesia complications

## 2019-10-03 NOTE — Telephone Encounter (Signed)
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  MRI brain is normal.  He will be referred to spine surgery urgently for surgical management.

## 2019-10-03 NOTE — Progress Notes (Signed)
Patient ID: Brandon Vargas, male   DOB: 28-Sep-1956, 63 y.o.   MRN: [id number]   I was asked to see this patient in the PACU.  He had received general anesthesia for an MRI and fell while getting dressed hitting his head creating a left scalp laceration.  On exam, he is awake and following commands.  There is a 4 to 5 cm left scalp laceration.  This require suture repair.  The family was notified and we obtained consent from the family and the patient.   Procedure:  The patient's left scalp was prepped and draped in the usual sterile fashion.  I anesthetized the skin with lidocaine and epinephrine.  I then closed the laceration with interrupted 4-0 Prolene sutures.  Good closure appeared to be achieved.  This was then covered with a dry gauze.  He tolerated this well.  We will arrange for outpatient follow-up for suture removal next Tuesday, October 13.

## 2019-10-03 NOTE — Progress Notes (Addendum)
Patient came straight from MRI to Phase II, patient was impulsive upon arrival, pt cleared neurologically within 5 minutes of being in Phase II, patient was still impulsive as far as leaving and getting home, patient was oriented X4 and VS stable, patient pulled own IV out, after another set of VS were taken I began to assist patient with getting dressed for discharge, patient was attempting to put shirt on while gown was still attached to him, gown was in a knot so I had to retrieve scissors to cut the gown, when I went to return the scissors patient was left alone and stood up to pull pants up and fell, several staff members came to the bedside and found the patient laying on his left side with some blood coming from his head, a few staff members assisted the patient back to bed, I held pressure with gauze at the site, Dr. Lanetta Inch was called and immediately came to the bedside, patient was neurologically intact alert and oriented X4, moved all extremities along with stable VS, Dr. Lanetta Inch called Dr. Ninfa Linden for an evaluation of the laceration. I continued to assess the patients neurological status Q15 minutes. Son, Barnabas Lister was called and updated, no questions or concerns.  Rowe Pavy, RN

## 2019-10-03 NOTE — Anesthesia Preprocedure Evaluation (Addendum)
Anesthesia Evaluation  Patient identified by MRN, date of birth, ID band Patient awake    Reviewed: Allergy & Precautions, NPO status , Patient's Chart, lab work & pertinent test results  Airway Mallampati: I  TM Distance: >3 FB Neck ROM: Full    Dental no notable dental hx. (+) Teeth Intact, Dental Advisory Given   Pulmonary neg pulmonary ROS,    Pulmonary exam normal breath sounds clear to auscultation       Cardiovascular negative cardio ROS Normal cardiovascular exam Rhythm:Regular Rate:Normal     Neuro/Psych PSYCHIATRIC DISORDERS Anxiety Depression negative neurological ROS     GI/Hepatic Neg liver ROS, GERD  ,  Endo/Other  diabetes, Oral Hypoglycemic Agents  Renal/GU negative Renal ROS  negative genitourinary   Musculoskeletal negative musculoskeletal ROS (+)   Abdominal   Peds  Hematology negative hematology ROS (+)   Anesthesia Other Findings MRI brain and Cspine for gait abnormality  Reproductive/Obstetrics                            Anesthesia Physical Anesthesia Plan  ASA: III  Anesthesia Plan: General   Post-op Pain Management:    Induction: Intravenous  PONV Risk Score and Plan: 2 and Midazolam, Dexamethasone and Ondansetron  Airway Management Planned: Oral ETT  Additional Equipment:   Intra-op Plan:   Post-operative Plan: Extubation in OR  Informed Consent: I have reviewed the patients History and Physical, chart, labs and discussed the procedure including the risks, benefits and alternatives for the proposed anesthesia with the patient or authorized representative who has indicated his/her understanding and acceptance.     Dental advisory given  Plan Discussed with: CRNA  Anesthesia Plan Comments:         Anesthesia Quick Evaluation

## 2019-10-03 NOTE — Telephone Encounter (Signed)
Office note and MR C- Spine faxed to [facility] for an urgent appt per Dr. Posey Pronto.

## 2019-10-04 ENCOUNTER — Encounter (HOSPITAL_COMMUNITY): Payer: Self-pay | Admitting: Radiology

## 2019-10-04 ENCOUNTER — Ambulatory Visit: Payer: PRIVATE HEALTH INSURANCE | Admitting: Neurology

## 2019-10-04 MED FILL — Ondansetron HCl Inj 4 MG/2ML (2 MG/ML): INTRAMUSCULAR | Qty: 2 | Status: AC

## 2019-10-04 MED FILL — Propofol IV Emul 200 MG/20ML (10 MG/ML): INTRAVENOUS | Qty: 20 | Status: AC

## 2019-10-04 MED FILL — Lidocaine HCl Local Soln Prefilled Syringe 100 MG/5ML (2%): INTRAMUSCULAR | Qty: 5 | Status: AC

## 2019-10-04 MED FILL — Succinylcholine Chloride Sol Pref Syr 200 MG/10ML (20 MG/ML): INTRAVENOUS | Qty: 10 | Status: AC

## 2019-10-04 MED FILL — Rocuronium Bromide IV Soln Pref Syringe 50 MG/5ML (10 MG/ML): INTRAVENOUS | Qty: 5 | Status: AC

## 2019-10-07 ENCOUNTER — Other Ambulatory Visit (HOSPITAL_COMMUNITY): Payer: PRIVATE HEALTH INSURANCE

## 2019-10-07 ENCOUNTER — Other Ambulatory Visit: Payer: Self-pay | Admitting: Neurosurgery

## 2019-10-10 ENCOUNTER — Ambulatory Visit (HOSPITAL_COMMUNITY): Payer: PRIVATE HEALTH INSURANCE

## 2019-10-10 ENCOUNTER — Other Ambulatory Visit (HOSPITAL_COMMUNITY): Payer: PRIVATE HEALTH INSURANCE

## 2019-10-10 DIAGNOSIS — Z4802 Encounter for removal of sutures: Secondary | ICD-10-CM | POA: Diagnosis not present

## 2019-10-10 NOTE — Anesthesia Postprocedure Evaluation (Signed)
Anesthesia Post Note  Patient: Casson Catena Mercado  Procedure(s) Performed: MRI WITH ANESTHESIA BRAIN AND CERVICAL SPINE WITHOUT CONTRAST (N/A )     Patient location during evaluation: PACU Anesthesia Type: General Level of consciousness: awake and alert Pain management: pain level controlled Vital Signs Assessment: post-procedure vital signs reviewed and stable Respiratory status: spontaneous breathing, nonlabored ventilation, respiratory function stable and patient connected to nasal cannula oxygen Cardiovascular status: blood pressure returned to baseline and stable Postop Assessment: no apparent nausea or vomiting Anesthetic complications: no    Last Vitals:  Vitals:   10/03/19 1115 10/03/19 1130  BP: 132/85 124/77  Pulse:    Resp:    Temp:  36.4 C  SpO2: 98% 97%    Last Pain:  Vitals:   10/03/19 1003  TempSrc:   PainSc: 0-No pain                 Kinte Trim L Romulo Okray

## 2019-10-15 DIAGNOSIS — Z0000 Encounter for general adult medical examination without abnormal findings: Secondary | ICD-10-CM | POA: Diagnosis not present

## 2019-10-15 DIAGNOSIS — R972 Elevated prostate specific antigen [PSA]: Secondary | ICD-10-CM | POA: Diagnosis not present

## 2019-10-15 DIAGNOSIS — Z23 Encounter for immunization: Secondary | ICD-10-CM | POA: Diagnosis not present

## 2019-10-15 DIAGNOSIS — R2689 Other abnormalities of gait and mobility: Secondary | ICD-10-CM | POA: Diagnosis not present

## 2019-10-15 DIAGNOSIS — M47812 Spondylosis without myelopathy or radiculopathy, cervical region: Secondary | ICD-10-CM | POA: Diagnosis not present

## 2019-10-21 DIAGNOSIS — Z23 Encounter for immunization: Secondary | ICD-10-CM | POA: Diagnosis not present

## 2019-10-22 ENCOUNTER — Emergency Department (HOSPITAL_COMMUNITY)
Admission: EM | Admit: 2019-10-22 | Discharge: 2019-10-23 | Disposition: A | Discharge status: Home / Self Care | Payer: Medicare HMO | Attending: Emergency Medicine | Admitting: Emergency Medicine

## 2019-10-22 ENCOUNTER — Other Ambulatory Visit: Payer: Self-pay

## 2019-10-22 ENCOUNTER — Encounter (HOSPITAL_COMMUNITY): Payer: Self-pay | Admitting: Physician Assistant

## 2019-10-22 ENCOUNTER — Encounter (HOSPITAL_COMMUNITY): Payer: Self-pay | Admitting: Emergency Medicine

## 2019-10-22 ENCOUNTER — Other Ambulatory Visit (HOSPITAL_COMMUNITY)
Admission: RE | Admit: 2019-10-22 | Discharge: 2019-10-22 | Disposition: A | Discharge status: Home / Self Care | Payer: Medicare HMO | Source: Ambulatory Visit | Attending: Neurosurgery | Admitting: Neurosurgery

## 2019-10-22 ENCOUNTER — Encounter (HOSPITAL_COMMUNITY)
Admission: RE | Admit: 2019-10-22 | Discharge: 2019-10-22 | Disposition: A | Discharge status: Home / Self Care | Payer: Medicare HMO | Source: Ambulatory Visit | Attending: Neurosurgery | Admitting: Neurosurgery

## 2019-10-22 DIAGNOSIS — E118 Type 2 diabetes mellitus with unspecified complications: Secondary | ICD-10-CM | POA: Insufficient documentation

## 2019-10-22 DIAGNOSIS — R739 Hyperglycemia, unspecified: Secondary | ICD-10-CM | POA: Diagnosis present

## 2019-10-22 DIAGNOSIS — E119 Type 2 diabetes mellitus without complications: Secondary | ICD-10-CM | POA: Insufficient documentation

## 2019-10-22 DIAGNOSIS — Z5321 Procedure and treatment not carried out due to patient leaving prior to being seen by health care provider: Secondary | ICD-10-CM | POA: Insufficient documentation

## 2019-10-22 DIAGNOSIS — Z20828 Contact with and (suspected) exposure to other viral communicable diseases: Secondary | ICD-10-CM | POA: Insufficient documentation

## 2019-10-22 DIAGNOSIS — Z01818 Encounter for other preprocedural examination: Secondary | ICD-10-CM | POA: Insufficient documentation

## 2019-10-22 HISTORY — DX: Type 2 diabetes mellitus without complications: E11.9

## 2019-10-22 LAB — HEMOGLOBIN A1C
Hgb A1c MFr Bld: 12 % — ABNORMAL HIGH (ref 4.8–5.6)
Mean Plasma Glucose: 297.7 mg/dL

## 2019-10-22 LAB — CBC
HCT: 40.4 % (ref 39.0–52.0)
HCT: 40.7 % (ref 39.0–52.0)
Hemoglobin: 14.5 g/dL (ref 13.0–17.0)
Hemoglobin: 14.6 g/dL (ref 13.0–17.0)
MCH: 29.7 pg (ref 26.0–34.0)
MCH: 29.6 pg (ref 26.0–34.0)
MCHC: 35.9 g/dL (ref 30.0–36.0)
MCHC: 35.9 g/dL (ref 30.0–36.0)
MCV: 82.8 fL (ref 80.0–100.0)
MCV: 82.6 fL (ref 80.0–100.0)
Platelets: 199 10*3/uL (ref 150–400)
Platelets: 200 10*3/uL (ref 150–400)
RBC: 4.88 MIL/uL (ref 4.22–5.81)
RBC: 4.93 MIL/uL (ref 4.22–5.81)
RDW: 11.8 % (ref 11.5–15.5)
RDW: 11.8 % (ref 11.5–15.5)
WBC: 6.1 10*3/uL (ref 4.0–10.5)
WBC: 6.1 10*3/uL (ref 4.0–10.5)
nRBC: 0 % (ref 0.0–0.2)
nRBC: 0 % (ref 0.0–0.2)

## 2019-10-22 LAB — BASIC METABOLIC PANEL
Anion gap: 12 (ref 5–15)
BUN: 11 mg/dL (ref 8–23)
CO2: 23 mmol/L (ref 22–32)
Calcium: 9.2 mg/dL (ref 8.9–10.3)
Chloride: 97 mmol/L — ABNORMAL LOW (ref 98–111)
Creatinine, Ser: 0.9 mg/dL (ref 0.61–1.24)
GFR calc Af Amer: >60 mL/min (ref >60)
GFR calc non Af Amer: >60 mL/min (ref >60)
Glucose, Bld: 548 mg/dL (ref 70–99)
Potassium: 4.4 mmol/L (ref 3.5–5.1)
Sodium: 132 mmol/L — ABNORMAL LOW (ref 135–145)

## 2019-10-22 LAB — COMPREHENSIVE METABOLIC PANEL
ALT: 28 U/L (ref 0–44)
AST: 23 U/L (ref 15–41)
Albumin: 3.7 g/dL (ref 3.5–5.0)
Alkaline Phosphatase: 69 U/L (ref 38–126)
Anion gap: 10 (ref 5–15)
BUN: 13 mg/dL (ref 8–23)
CO2: 23 mmol/L (ref 22–32)
Calcium: 8.9 mg/dL (ref 8.9–10.3)
Chloride: 98 mmol/L (ref 98–111)
Creatinine, Ser: 0.94 mg/dL (ref 0.61–1.24)
GFR calc Af Amer: >60 mL/min (ref >60)
GFR calc non Af Amer: >60 mL/min (ref >60)
Glucose, Bld: 627 mg/dL (ref 70–99)
Potassium: 4.1 mmol/L (ref 3.5–5.1)
Sodium: 131 mmol/L — ABNORMAL LOW (ref 135–145)
Total Bilirubin: 0.5 mg/dL (ref 0.3–1.2)
Total Protein: 6.2 g/dL — ABNORMAL LOW (ref 6.5–8.1)

## 2019-10-22 LAB — TYPE AND SCREEN
ABO/RH(D): A POS
Antibody Screen: NEGATIVE

## 2019-10-22 LAB — GLUCOSE, CAPILLARY: Glucose-Capillary: >600 mg/dL (ref 70–99)

## 2019-10-22 LAB — CBG MONITORING, ED: Glucose-Capillary: 537 mg/dL (ref 70–99)

## 2019-10-22 LAB — SURGICAL PCR SCREEN
MRSA, PCR: NEGATIVE
Staphylococcus aureus: NEGATIVE

## 2019-10-22 LAB — SARS CORONAVIRUS 2 (TAT 6-24 HRS): SARS Coronavirus 2: NEGATIVE

## 2019-10-22 LAB — ABO/RH: ABO/RH(D): A POS

## 2019-10-22 NOTE — Pre-Procedure Instructions (Signed)
Brandon Vargas  10/22/2019      [id number] [place], [place] [address] Phone: [phone] Fax: [phone]    Your procedure is scheduled on Friday 10/25/2019  Report to [facility] Admitting at 1:00 P.M.  Call this number if you have problems the morning of surgery:  [phone]   Remember:  Do not eat or drink after midnight    Take these medicines the morning of surgery with A SIP OF WATER :  Sertraline (Zoloft)     Do not wear jewelry, make-up or nail polish.  Do not wear lotions, powders, or perfumes, or deodorant.  Do not shave 48 hours prior to surgery.  Men may shave face and neck.  Do not bring valuables to the hospital.  [facility] is not responsible for any belongings or valuables.  Contacts, dentures or bridgework may not be worn into surgery.  Leave your suitcase in the car.  After surgery it may be brought to your room.  For patients admitted to the hospital, discharge time will be determined by your treatment team.  Patients discharged the day of surgery will not be allowed to drive home.     Please read over the following fact sheets that you were given. Coughing and Deep Breathing, Blood Transfusion Information, MRSA Information and Surgical Site Infection Prevention    [facility]- Preparing For Surgery  Before surgery, you can play an important role. Because skin is not sterile, your skin needs to be as free of germs as possible. You can reduce the number of germs on your skin by washing with CHG (chlorahexidine gluconate) Soap before surgery.  CHG is an antiseptic cleaner which kills germs and bonds with the skin to continue killing germs even after washing.    Oral Hygiene is also important to reduce your risk of infection.  Remember - BRUSH YOUR TEETH THE MORNING OF SURGERY WITH YOUR REGULAR TOOTHPASTE  Please do not use if you have an allergy to CHG or antibacterial  soaps. If your skin becomes reddened/irritated stop using the CHG.  Do not shave (including legs and underarms) for at least 48 hours prior to first CHG shower. It is OK to shave your face.  Please follow these instructions carefully.   1. Shower the NIGHT BEFORE SURGERY and the MORNING OF SURGERY with CHG.   2. If you chose to wash your hair, wash your hair first as usual with your normal shampoo.  3. After you shampoo, rinse your hair and body thoroughly to remove the shampoo.  4. Use CHG as you would any other liquid soap. You can apply CHG directly to the skin and wash gently with a scrungie or a clean washcloth.   5. Apply the CHG Soap to your body ONLY FROM THE NECK DOWN.  Do not use on open wounds or open sores. Avoid contact with your eyes, ears, mouth and genitals (private parts). Wash Face and genitals (private parts)  with your normal soap.  6. Wash thoroughly, paying special attention to the area where your surgery will be performed.  7. Thoroughly rinse your body with warm water from the neck down.  8. DO NOT shower/wash with your normal soap after using and rinsing off the CHG Soap.  9. Pat yourself dry with a CLEAN TOWEL.  10. Wear CLEAN PAJAMAS to bed the night before surgery, wear comfortable clothes the morning of surgery  11. Place CLEAN  SHEETS on your bed the night of your first shower and DO NOT SLEEP WITH PETS.    Day of Surgery:  Do not apply any deodorants/lotions.  Please wear clean clothes to the hospital/surgery center.   Remember to brush your teeth WITH YOUR REGULAR TOOTHPASTE.    WHAT DO I DO ABOUT MY DIABETES MEDICATION?   Marland Kitchen Do not take oral diabetes medicines (pills) the morning of surgery.   . The day of surgery, do not take other diabetes injectables, including Byetta (exenatide), Bydureon (exenatide ER), Victoza (liraglutide), or Trulicity (dulaglutide).  . If your CBG is greater than 220 mg/dL, you may take  of your sliding scale  (correction) dose of insulin.   How to Manage Your Diabetes Before and After Surgery  Why is it important to control my blood sugar before and after surgery? . Improving blood sugar levels before and after surgery helps healing and can limit problems. . A way of improving blood sugar control is eating a healthy diet by: o  Eating less sugar and carbohydrates o  Increasing activity/exercise o  Talking with your doctor about reaching your blood sugar goals . High blood sugars (greater than 180 mg/dL) can raise your risk of infections and slow your recovery, so you will need to focus on controlling your diabetes during the weeks before surgery. . Make sure that the doctor who takes care of your diabetes knows about your planned surgery including the date and location.  How do I manage my blood sugar before surgery? . Check your blood sugar at least 4 times a day, starting 2 days before surgery, to make sure that the level is not too high or low. o Check your blood sugar the morning of your surgery when you wake up and every 2 hours until you get to the Short Stay unit. . If your blood sugar is less than 70 mg/dL, you will need to treat for low blood sugar: o Do not take insulin. o Treat a low blood sugar (less than 70 mg/dL) with  cup of clear juice (cranberry or apple), 4 glucose tablets, OR glucose gel. o Recheck blood sugar in 15 minutes after treatment (to make sure it is greater than 70 mg/dL). If your blood sugar is not greater than 70 mg/dL on recheck, call [phone] for further instructions. . Report your blood sugar to the short stay nurse when you get to Short Stay.  . If you are admitted to the hospital after surgery: o Your blood sugar will be checked by the staff and you will probably be given insulin after surgery (instead of oral diabetes medicines) to make sure you have good blood sugar levels. o The goal for blood sugar control after surgery is 80-180 mg/dL.

## 2019-10-22 NOTE — Progress Notes (Signed)
[id number] Brandon Vargas, [place] [address] [place] [address] Phone: [phone] Fax: [phone]      Your procedure is scheduled on Thursday, October 24, 2019.  Report to [facility]" at 1:00 P.M., and check in at the Admitting office.  Call this number if you have problems the morning of surgery:  [phone]  Call [phone] if you have any questions prior to your surgery date Monday-Friday 8am-4pm   Remember:  Do not eat or drink after midnight the night before your surgery    Take these medicines the morning of surgery with A SIP OF WATER :  Sertraline (Zoloft)  7 days prior to surgery STOP taking any Aspirin (unless otherwise instructed by your surgeon), Aleve, Naproxen, Ibuprofen, Motrin, Advil, Goody's, BC's, all herbal medications, fish oil, and all vitamins.   WHAT DO I DO ABOUT MY DIABETES MEDICATION?   Marland Kitchen Do not take oral diabetes medicines (pills) the morning of surgery. DO NOT TAKE METFORMIN (Glucophage-XR) the morning of surgery.   How to Manage Your Diabetes Before and After Surgery  Why is it important to control my blood sugar before and after surgery? . Improving blood sugar levels before and after surgery helps healing and can limit problems. . A way of improving blood sugar control is eating a healthy diet by: o  Eating less sugar and carbohydrates o  Increasing activity/exercise o  Talking with your doctor about reaching your blood sugar goals . High blood sugars (greater than 180 mg/dL) can raise your risk of infections and slow your recovery, so you will need to focus on controlling your diabetes during the weeks before surgery. . Make sure that the doctor who takes care of your diabetes knows about your planned surgery including the date and location.  How do I manage my blood sugar before surgery? . Check your blood sugar at least 4 times a day, starting 2 days before surgery, to make  sure that the level is not too high or low. o Check your blood sugar the morning of your surgery when you wake up and every 2 hours until you get to the Short Stay unit. . If your blood sugar is less than 70 mg/dL, you will need to treat for low blood sugar: o Do not take insulin. o Treat a low blood sugar (less than 70 mg/dL) with  cup of clear juice (cranberry or apple), 4 glucose tablets, OR glucose gel. o Recheck blood sugar in 15 minutes after treatment (to make sure it is greater than 70 mg/dL). If your blood sugar is not greater than 70 mg/dL on recheck, call [phone] for further instructions. . Report your blood sugar to the short stay nurse when you get to Short Stay.  . If you are admitted to the hospital after surgery: o Your blood sugar will be checked by the staff and you will probably be given insulin after surgery (instead of oral diabetes medicines) to make sure you have good blood sugar levels. o The goal for blood sugar control after surgery is 80-180 mg/dL.    The Morning of Surgery  Do not wear jewelry.  Do not wear lotions, powders, or perfumes/colognes, or deodorant  Do not shave 48 hours prior to surgery.  Men may shave face and neck.  Do not bring valuables to the hospital.  [facility] is not responsible for any belongings or valuables.  If you are a smoker, DO NOT Smoke 24  hours prior to surgery  IF you wear a CPAP at night please bring your mask, tubing, and machine the morning of surgery   Remember that you must have someone to transport you home after your surgery, and remain with you for 24 hours if you are discharged the same day.  Contacts, glasses, hearing aids, dentures or bridgework may not be worn into surgery.  Leave your suitcase in the car.  After surgery it may be brought to your room.  For patients admitted to the hospital, discharge time will be determined by your treatment team.  Patients discharged the day of surgery will not be allowed  to drive home.    Special instructions:   [facility]- Preparing For Surgery  Before surgery, you can play an important role. Because skin is not sterile, your skin needs to be as free of germs as possible. You can reduce the number of germs on your skin by washing with CHG (chlorahexidine gluconate) Soap before surgery.  CHG is an antiseptic cleaner which kills germs and bonds with the skin to continue killing germs even after washing.    Oral Hygiene is also important to reduce your risk of infection.  Remember - BRUSH YOUR TEETH THE MORNING OF SURGERY WITH YOUR REGULAR TOOTHPASTE  Please do not use if you have an allergy to CHG or antibacterial soaps. If your skin becomes reddened/irritated stop using the CHG.  Do not shave (including legs and underarms) for at least 48 hours prior to first CHG shower. It is OK to shave your face.  Please follow these instructions carefully.   1. Shower the NIGHT BEFORE SURGERY and the MORNING OF SURGERY with CHG Soap.   2. If you chose to wash your hair, wash your hair first as usual with your normal shampoo.  3. After you shampoo, rinse your hair and body thoroughly to remove the shampoo.  4. Use CHG as you would any other liquid soap. You can apply CHG directly to the skin and wash gently with a scrungie or a clean washcloth.   5. Apply the CHG Soap to your body ONLY FROM THE NECK DOWN.  Do not use on open wounds or open sores. Avoid contact with your eyes, ears, mouth and genitals (private parts). Wash Face and genitals (private parts)  with your normal soap.   6. Wash thoroughly, paying special attention to the area where your surgery will be performed.  7. Thoroughly rinse your body with warm water from the neck down.  8. DO NOT shower/wash with your normal soap after using and rinsing off the CHG Soap.  9. Pat yourself dry with a CLEAN TOWEL.  10. Wear CLEAN PAJAMAS to bed the night before surgery, wear comfortable clothes the morning of  surgery  11. Place CLEAN SHEETS on your bed the night of your first shower and DO NOT SLEEP WITH PETS.    Day of Surgery:  Do not apply any deodorants/lotions. Please shower the morning of surgery with the CHG soap  Please wear clean clothes to the hospital/surgery center.   Remember to brush your teeth WITH YOUR REGULAR TOOTHPASTE.   Please read over the following fact sheets that you were given.

## 2019-10-22 NOTE — ED Triage Notes (Signed)
Pt was send down from pre-op due to hyperglycemia.

## 2019-10-22 NOTE — ED Notes (Signed)
Pt name called for vitals, no response

## 2019-10-22 NOTE — ED Notes (Signed)
Pt called again still no response

## 2019-10-22 NOTE — Progress Notes (Signed)
Pt arrived to PAT appt--CBG obtained, meter states too high to read.  VS obtained, pt A&O x4, feels lightheaded, states he is a newly diagnosed DM--only takes Metformin. Does not routinely check his CBG at home.  Has not eaten lunch, but ate breakfast at 8am including waffles with syrup, bowl of cheerios/milk.  Stat labs obtained per anesthesia protocol and EKG showed NSR, called [facility] to make aware, instructed to send to ED triage.  Left PAT with 2 RNs, called charge ED to make aware.

## 2019-10-24 ENCOUNTER — Encounter (HOSPITAL_COMMUNITY): Admission: RE | Payer: Self-pay | Source: Home / Self Care

## 2019-10-24 ENCOUNTER — Inpatient Hospital Stay (HOSPITAL_COMMUNITY): Admission: RE | Admit: 2019-10-24 | Payer: Medicare HMO | Source: Home / Self Care | Admitting: Neurosurgery

## 2019-10-24 SURGERY — ANTERIOR CERVICAL DECOMPRESSION/DISCECTOMY FUSION 4 LEVELS
Anesthesia: General

## 2020-02-27 HISTORY — PX: CERVICAL LAMINOPLASTY: SHX1333

## 2020-03-18 ENCOUNTER — Encounter: Payer: PRIVATE HEALTH INSURANCE | Admitting: Gastroenterology

## 2020-05-19 ENCOUNTER — Encounter: Payer: Self-pay | Admitting: Gastroenterology

## 2020-05-29 ENCOUNTER — Ambulatory Visit (AMBULATORY_SURGERY_CENTER): Payer: Self-pay

## 2020-05-29 ENCOUNTER — Other Ambulatory Visit: Payer: Self-pay

## 2020-05-29 VITALS — Ht 72.0 in | Wt 205.0 lb

## 2020-05-29 DIAGNOSIS — Z86010 Personal history of colonic polyps: Secondary | ICD-10-CM

## 2020-05-29 DIAGNOSIS — Z800 Family history of malignant neoplasm of digestive organs: Secondary | ICD-10-CM

## 2020-05-29 DIAGNOSIS — Z01818 Encounter for other preprocedural examination: Secondary | ICD-10-CM

## 2020-05-29 MED ORDER — SUTAB 1479-225-188 MG PO TABS: 12.0000 | ORAL_TABLET | ORAL | 0 refills | Status: DC

## 2020-05-29 NOTE — Progress Notes (Signed)

## 2020-06-15 ENCOUNTER — Other Ambulatory Visit: Payer: Self-pay | Admitting: Gastroenterology

## 2020-06-15 ENCOUNTER — Ambulatory Visit (INDEPENDENT_AMBULATORY_CARE_PROVIDER_SITE_OTHER): Payer: Medicare HMO

## 2020-06-15 DIAGNOSIS — Z1159 Encounter for screening for other viral diseases: Secondary | ICD-10-CM

## 2020-06-16 LAB — SARS CORONAVIRUS 2 (TAT 6-24 HRS): SARS Coronavirus 2: NEGATIVE

## 2020-06-17 ENCOUNTER — Telehealth: Payer: Self-pay | Admitting: Gastroenterology

## 2020-06-17 ENCOUNTER — Encounter: Payer: PRIVATE HEALTH INSURANCE | Admitting: Gastroenterology

## 2020-06-17 NOTE — Telephone Encounter (Signed)
Returned pts call.  He ate an arbys sandwich yesterday, took the first half of the prep last night but did not take the 2nd half this morning.  Spoke with Dr. Tarri Glenn.  Since the patient has not fully prepped, she would like for the patient to be rescheduled for another day.  Phoned pt again and rescheduled pt for a PV and colonoscopy in late August.  Pt verbalized understanding.

## 2020-06-17 NOTE — Telephone Encounter (Signed)
Patient called states he had a Arbys sandwich yesterday and took his diabetic medication this morning. Please advise

## 2020-08-06 ENCOUNTER — Telehealth: Payer: Self-pay | Admitting: *Deleted

## 2020-08-06 NOTE — Telephone Encounter (Signed)
Patient missed pre-visit appt at 11:00 am.  Contacted and he requested to cancel colonoscopy as he is in process of moving to a nursing home, he will call and re-schedule once he is settled.

## 2020-08-17 ENCOUNTER — Encounter: Payer: Medicare HMO | Admitting: Gastroenterology

## 2020-09-08 ENCOUNTER — Encounter: Payer: Self-pay | Admitting: Psychology

## 2020-09-21 ENCOUNTER — Encounter: Payer: Self-pay | Admitting: Psychology

## 2020-09-23 ENCOUNTER — Encounter: Payer: Medicare HMO | Admitting: Psychology

## 2020-10-08 ENCOUNTER — Other Ambulatory Visit: Payer: Self-pay

## 2020-10-08 ENCOUNTER — Encounter: Payer: Medicare HMO | Attending: Psychology | Admitting: Psychology

## 2020-10-08 DIAGNOSIS — G3184 Mild cognitive impairment, so stated: Secondary | ICD-10-CM

## 2020-10-08 DIAGNOSIS — R419 Unspecified symptoms and signs involving cognitive functions and awareness: Secondary | ICD-10-CM | POA: Diagnosis not present

## 2020-10-08 DIAGNOSIS — F09 Unspecified mental disorder due to known physiological condition: Secondary | ICD-10-CM | POA: Diagnosis present
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
## 2020-10-12 ENCOUNTER — Encounter: Payer: Self-pay | Admitting: Psychology

## 2020-10-20 ENCOUNTER — Ambulatory Visit: Payer: Medicare HMO | Admitting: Psychology

## 2020-10-23 ENCOUNTER — Ambulatory Visit: Payer: Medicare HMO | Admitting: Psychology

## 2020-10-26 ENCOUNTER — Ambulatory Visit: Payer: Medicare HMO | Admitting: Psychology

## 2020-10-28 ENCOUNTER — Ambulatory Visit: Payer: Medicare HMO | Admitting: Psychology

## 2020-11-04 ENCOUNTER — Ambulatory Visit: Payer: Medicare HMO | Admitting: Psychology

## 2020-11-17 ENCOUNTER — Encounter: Payer: Medicare HMO | Attending: Psychology | Admitting: Psychology

## 2020-11-17 ENCOUNTER — Other Ambulatory Visit: Payer: Self-pay

## 2020-11-17 ENCOUNTER — Encounter: Payer: Self-pay | Admitting: Psychology

## 2020-11-17 DIAGNOSIS — G3184 Mild cognitive impairment, so stated: Secondary | ICD-10-CM | POA: Diagnosis not present

## 2020-11-17 DIAGNOSIS — F09 Unspecified mental disorder due to known physiological condition: Secondary | ICD-10-CM | POA: Diagnosis not present

## 2020-11-17 NOTE — Progress Notes (Addendum)
   Neuropsychology Note  Brandon Vargas completed 120 minutes of neuropsychological testing with this provider. The patient did not appear overtly distressed by the testing session, per behavioral observation or via self-report. est breaks were offered.   Brandon Vargas will return on 11/24/20 to compete the battery of tests selected for this evaluation.   He will then return for an interactive feedback session on 12/01/20 with this provider at which time his test performances, clinical impressions and treatment recommendations will be reviewed in detail. The patient understands he can contact our office should he require our assistance before this time.  Full report to follow.  *Initial consultation progress note dated 10/08/20 in EMR

## 2020-11-24 ENCOUNTER — Encounter (HOSPITAL_BASED_OUTPATIENT_CLINIC_OR_DEPARTMENT_OTHER): Payer: Medicare HMO | Admitting: Psychology

## 2020-11-24 ENCOUNTER — Other Ambulatory Visit: Payer: Self-pay

## 2020-11-24 DIAGNOSIS — F09 Unspecified mental disorder due to known physiological condition: Secondary | ICD-10-CM | POA: Diagnosis not present

## 2020-11-24 DIAGNOSIS — R4189 Other symptoms and signs involving cognitive functions and awareness: Secondary | ICD-10-CM | POA: Diagnosis not present

## 2020-11-24 DIAGNOSIS — F989 Unspecified behavioral and emotional disorders with onset usually occurring in childhood and adolescence: Secondary | ICD-10-CM

## 2020-11-24 DIAGNOSIS — R41841 Cognitive communication deficit: Secondary | ICD-10-CM

## 2020-11-25 ENCOUNTER — Encounter: Payer: Self-pay | Admitting: Psychology

## 2020-11-25 NOTE — Progress Notes (Addendum)
   Neuropsychology Note  Brandon Vargas completed another 120 minutes of neuropsychological testing with this provider. The patient did not appear overtly distressed by the testing session, per behavioral observation or via self-report. Rest breaks were offered.   Tests Administered:   Animal Naming Test   Controlled Oral Word Association Test (COWAT)   Hand Dynamometer  Trail Making Test (Part A & B)  Wechsler Adult Intelligence Scale, 4th Edition, (WAIS-IV)   Results: To be included in final report.   Feedback to Patient:  Brandon Vargas will return on 12/01/20 for an interactive feedback session with this provider at which time his test performances, clinical impressions and treatment recommendations will be reviewed in detail. The patient understands he can contact our office should he require our assistance before this time.  Full report to follow.  See initial consultation note dated 10/08/20 in EMR. Note from first 2-hour testing appointment can be found in EMR on 11/17/20

## 2020-11-26 ENCOUNTER — Encounter: Payer: Self-pay | Admitting: Psychology

## 2020-11-26 ENCOUNTER — Encounter: Payer: Medicare HMO | Attending: Psychology | Admitting: Psychology

## 2020-11-26 ENCOUNTER — Other Ambulatory Visit: Payer: Self-pay

## 2020-11-26 DIAGNOSIS — F09 Unspecified mental disorder due to known physiological condition: Secondary | ICD-10-CM

## 2020-11-26 DIAGNOSIS — R69 Illness, unspecified: Secondary | ICD-10-CM | POA: Diagnosis not present

## 2020-11-26 NOTE — Progress Notes (Signed)
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 shadows. - Cover mirrors with a cloth or remove them if the person thinks that he or she is looking at a stranger.  Delusions (firmly held beliefs in things that are not real) may occur in middle-to-late-stage dementia. Confusion and memory loss -- such as the inability to remember certain people or objects -- can contribute to these untrue beliefs. A person with dementia may believe a family member is stealing his or her possessions or that he or she is being followed by the police. This kind of suspicious delusion is sometimes referred to as paranoia. Although not grounded in reality, the situation is very real to the person with dementia. Keep in mind that a person with dementia is trying to make sense of his or her world with declining cognitive function. A delusion is not the same thing as a hallucination. While delusions involve false beliefs, hallucinations are false perceptions of objects or events that are sensory in nature. The following strategies may be helpful in responding  to delusions: o Don't take offense. Listen to what is troubling the person, and try to understand that reality. Then be reassuring, and let the person know you care. o Don't argue or try to convince. Allow the individual to express ideas. Acknowledge his or her opinions. o Offer a simple answer. Share your thoughts with the individual, but keep it simple. Don't overwhelm the person with lengthy explanations or reasons. o Switch the focus to another activity. Engage the individual in an activity, or ask for help with a chore. o Duplicate any lost items. If the person is often searching for a specific item, have several available (if possible). For example, if the individual is always looking for his or her wallet, purchase two of the same kind.  If you have any questions, please contact us at [phone].   This report is intended solely for the confidential review and use by the referring professional to assist in diagnostic and medical decision making needs.  This report should not be released to a third party without proper consent. [NOTE: data can be made available to qualified professionals with  permission from the patient or legal representative/caregiver]    ____________________________________ Alfonso Ellis, PsyD,  Licensed Psychologist (Provisional) Clinical Neuropsychologist     TEST SCORES:  Note: This summary of test scores accompanies the interpretive report and should not be considered in isolation without reference to the appropriate sections in the text. Descriptors are based on appropriate normative data and may be adjusted based on clinical judgment. The terms "impaired" and "within normal limits (WNL)" are used when a more specific level of functioning cannot be determined.    Validity Testing:     Descriptor         WAIS-IV Reliable Digit Span: --- --- Within Expectation         Intellectual Functioning:                 Standard Score Percentile    WRAT-4  Reading: 102 55 Average         Wechsler Adult Intelligence Scale (WAIS-IV):  Standard Score/ Scaled Score Percentile    Full Scale IQ  80 9 Ades Average  GAI 91 27 Average  Verbal Comprehension Index: 100 50 Average  Similarities  9 37 Average  Vocabulary 10 50 Average  Information  11 63 Average  Perceptual Reasoning Index:  82 12 Fuson Average  Block Design  8 25 Average  Matrix Reasoning  6 9 Shimamoto Average  Visual Puzzles 7 16 Dieter Average  Working Memory Index: 89 23 Schueler Average  Digit Span 9 37 Average  Arithmetic  7 16 Hofferber Average  Processing Speed Index: 56 <1 Exceptionally Low  Symbol Search  1 <1 Exceptionally Low  Coding 3 1 Exceptionally Low         Memory:               Wechsler Memory Scale (WMS-IV):                       Raw Score (Scaled Score) Percentile    Logical Memory I 32/53 (10)  Average  Logical Memory II 18/39 (10)  Average  Logical Memory Recognition 13/23  Well Buskirk Average         Verbal Paired Associated I 0/80 (45)    Verbal Paired Associated II /16 (1)    Verbal Paired Associated Recognition  /16 (1)           Wechsler Memory Scale (WMS-IV):                       Raw Score (Scaled Score) Percentile    Visual Reproduction I / (10) 50 Average  Visual Reproduction II / (11) 63 Average   Visual Reproduction Recognition /7           Attention/Executive Function:               Trail Making Test (TMT): Raw Score (T Score) Percentile    Part A 124 secs., 1 errors (20) <1 Exceptionally Low  Part B 313 secs., 4 errors (21) <1 Exceptionally Low           Scaled Score Percentile    WAIS-IV Coding: 3 1 Exceptionally Low           Scaled Score Percentile    WAIS-IV Digit Span: 9 37 Average  Forward 9 37 Average  Backward 10 50 Average  Sequencing 8 25 Average           Age-Scaled Score Percentile    Wechsler  Memory Scale (WMS-IV) Symbol Span: 5 5 Well Purrington Average           Scaled Score Percentile    WAIS-IV Similarities: 9 37 Average                 Language:               Verbal Fluency Test: Raw Score (T Score) Percentile    Phonemic Fluency (FAS)      Animal Fluency 11 (34) 5 Well Barcus Average         Visuospatial/Visuoconstruction:          Raw Score Percentile    Clock Drawing: /10 --- 999           Scaled Score Percentile    WAIS-IV Block Design: 8 25 Average  WAIS-IV Matrix Reasoning: 6 9 Sen Average  WAIS-IV Visual Puzzles: 7 16 Welz Average         Sensory-Motor:               Hand Dynamometer: Raw Score Percentile    Dominant Hand (Rt.) 36.5 Kg - Average  Non-Dominant Hand 17 Kg - Kesecker Average         Mood and Personality:          Raw Score Percentile    Geriatric Depression Scale, Short Form: - ---   Geriatric Anxiety Scale: - ---   Somatic - ---   Cognitive - ---   Affective - ---          Additional Questionnaires:               Parkinson's Disease Questionnaire-39: Raw Score Percentile    Mobility - - -  Activities of Daily Living - - -  Emotional Well-Being - - -  Stigma - - -  Social Support - - -  Cognitions - - -  Communication - - -  Bodily Discomfort - - -           Index Score Summary  Index Sum of Scaled Scores Index Score Percentile Rank 95% Confidence Interval Qualitative Descriptor  Auditory Memory (AMI) 32 88 21 82-95 Low Average  Visual Memory (VMI) 21 102 55 97-107 Average  Immediate Memory (IMI) 26 91 27 85-98 Average  Delayed Memory (DMI) 27 93 32 86-101 Average   ABILITY-MEMORY ANALYSIS  Ability Score:  GAI: 93 Date of Testing:  WAIS-IV; WMS-IV 2020/11/17  Predicted Difference Method   Index Predicted WMS-IV Index Score Actual WMS-IV Index Score Difference Critical Value  Significant Difference Y/N Base Rate  Auditory Memory 96 88 8 9.22 N   Visual Memory 96 102 -6 8.25 N   Immediate Memory 95 91 4 10.13 N   Delayed Memory 96 93 3 11.43 N   Statistical significance (critical value) at the .01 level.     Billing/Service Summary:    Neurobehavioral Status Exam:  Base: [id number] Add-on: [id number]  Direct clinical assessment (interview) of the patient and collateral interviews (as appropriate) by the licensed psychologist  Total time: 120 minutes       Total units:  1 1  Neuropsychological Testing Evaluation Services:   Base: [id number] Add-on: [phone]  Records review & clarify referral question; Patient symptom management; clinical decision making/battery modification; Integration/report generation; and, post-service work   Total time:  120 minutes  Total units:  1 1  Designer, fashion/clothing by Psychologist:  Base: [id number] Add-on: [phone]  Test Administration (face-to-face) Scoring (Non-face-to-face)                                                                                       Total time: 240 minutes      Total units:  1 7

## 2020-12-01 ENCOUNTER — Encounter: Payer: Self-pay | Admitting: Psychology

## 2020-12-01 ENCOUNTER — Encounter: Payer: Medicare HMO | Admitting: Psychology

## 2020-12-01 ENCOUNTER — Other Ambulatory Visit: Payer: Self-pay

## 2020-12-01 DIAGNOSIS — F09 Unspecified mental disorder due to known physiological condition: Secondary | ICD-10-CM

## 2020-12-01 DIAGNOSIS — R69 Illness, unspecified: Secondary | ICD-10-CM | POA: Diagnosis not present

## 2020-12-01 NOTE — Progress Notes (Signed)
   Neuropsychology Feedback Appointment  Brandon Vargas and his brother returned for a feedback appointment today to review the results of his recent neuropsychological evaluation with this provider. 60 minutes face-to-face time was spent reviewing his test results, my impressions and my recommendation for additional testing to further aid in differential diagnosis and broaden baseline assessment data. Patient and brother understood rational and agreed to this recommendation. The patient and her brother were given the opportunity to ask questions, and I did my best to answer these to their satisfaction. There is reasonable concern for atypical parkinson's disease such as PSP. He displayed vertical and some horizontal gaze palsy with reduced eye blink and surprised facial expression. DLB is still being considered but may be less likely given intact memory at this time. He is scheduled for 2-hour testing appointment to look more broadly at cognitive, emotional, behavioral, and adaptive functions. Will update accordingly when testing is complete.  Next testing session scheduled for 01/01/20

## 2020-12-31 ENCOUNTER — Encounter: Payer: Medicare HMO | Attending: Psychology | Admitting: Psychology

## 2020-12-31 ENCOUNTER — Other Ambulatory Visit: Payer: Self-pay

## 2020-12-31 ENCOUNTER — Encounter: Payer: Self-pay | Admitting: Psychology

## 2020-12-31 DIAGNOSIS — F09 Unspecified mental disorder due to known physiological condition: Secondary | ICD-10-CM | POA: Diagnosis not present

## 2020-12-31 DIAGNOSIS — R4189 Other symptoms and signs involving cognitive functions and awareness: Secondary | ICD-10-CM

## 2020-12-31 DIAGNOSIS — G231 Progressive supranuclear ophthalmoplegia [Steele-Richardson-Olszewski]: Secondary | ICD-10-CM | POA: Insufficient documentation

## 2020-12-31 NOTE — Progress Notes (Signed)
° °  Neuropsychology Note  [name] completed 120 minutes of neuropsychological testing with this provider. The patient did not appear overtly distressed by the testing session, per behavioral observation or via self-report. Rest breaks were offered.   Clinical Decision Making: In considering the patient's current level of functioning, level of presumed impairment, nature of symptoms, emotional and behavioral responses during the interview, level of literacy, and observed level of motivation/effort, a battery of tests was selected. Changes were made as deemed necessary based on patient performance on testing, my behavioral observations and additional pertinent factors such as those listed above.  Brandon Vargas will return for an interactive feedback session with this provider within 2 weeks at which time his test performances, clinical impressions and treatment recommendations will be reviewed in detail. The patient understands he can contact our office should he require our assistance before this time.  Full report to follow.

## 2021-01-21 ENCOUNTER — Other Ambulatory Visit: Payer: Self-pay

## 2021-01-21 ENCOUNTER — Encounter (HOSPITAL_BASED_OUTPATIENT_CLINIC_OR_DEPARTMENT_OTHER): Payer: Medicare HMO | Admitting: Psychology

## 2021-01-21 ENCOUNTER — Encounter: Payer: Self-pay | Admitting: Psychology

## 2021-01-21 DIAGNOSIS — F0390 Unspecified dementia without behavioral disturbance: Secondary | ICD-10-CM

## 2021-01-21 DIAGNOSIS — F09 Unspecified mental disorder due to known physiological condition: Secondary | ICD-10-CM | POA: Diagnosis not present

## 2021-01-21 DIAGNOSIS — G231 Progressive supranuclear ophthalmoplegia [Steele-Richardson-Olszewski]: Secondary | ICD-10-CM | POA: Diagnosis not present

## 2021-01-21 NOTE — Progress Notes (Unsigned)
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 lights to reduce shadows. - Cover mirrors with a cloth or remove them if the person thinks that he or she is looking at a stranger.  Delusions (firmly held beliefs in things that are not real) may occur in middle-to-late-stage dementia. Confusion and memory loss - such as the inability to remember certain people or objects - can contribute to these untrue beliefs. A person with dementia may believe a family member is stealing his or her possessions or that he or she is being followed by the police. This kind of suspicious delusion is sometimes referred to as paranoia. Although not grounded in reality, the situation is very real to the person with dementia. Keep in mind that a person with dementia is trying to make sense of his or her world with declining cognitive function. A delusion is not the same thing as a hallucination. While delusions involve false beliefs, hallucinations are false perceptions of objects or events that are sensory in nature. The following strategies may be helpful in responding to  delusions: o Don't take offense. Listen to what is troubling the person, and try to understand that reality. Then be reassuring, and let the person know you care. o Don't argue or try to convince. Allow the individual to express ideas. Acknowledge his or her opinions. o Offer a simple answer. Share your thoughts with the individual, but keep it simple. Don't overwhelm the person with lengthy explanations or reasons. o Switch the focus to another activity. Engage the individual in an activity, or ask for help with a chore. o Duplicate any lost items. If the person is often searching for a specific item, have several available (if possible). For example, if the individual is always looking for his or her wallet, purchase two of the same kind.  If you have any questions, please contact us at [phone].   This report is intended solely for the confidential review and use by the referring professional to assist in diagnostic and medical decision making needs.  This report should not be released to a third party without proper consent. [NOTE: data can be made available to qualified professionals with  permission from the patient or legal representative/caregiver]    ____________________________________ Alfonso Ellis, PsyD,  Licensed Psychologist (Provisional) Clinical Neuropsychologist     TEST SCORES:  Note: This summary of test scores accompanies the interpretive report and should not be considered in isolation without reference to the appropriate sections in the text. Descriptors are based on appropriate normative data and may be adjusted based on clinical judgment. The terms "impaired" and "within normal limits (WNL)" are used when a more specific level of functioning cannot be determined.    Validity Testing:     Descriptor         WAIS-IV Reliable Digit Span: --- --- Within Expectation         Intellectual Functioning:                 Standard Score Percentile    WRAT-4 Reading:  102 55 Average         Wechsler Adult Intelligence Scale (WAIS-IV):  Standard Score/ Scaled Score Percentile    Full Scale IQ  80 9 [place] Average  GAI 91 27 Average  Verbal Comprehension Index: 100 50 Average  Similarities  9 37 Average  Vocabulary 10 50 Average  Information  11 63 Average  Perceptual Reasoning Index:  82 12 [place] Average  Block Design  8 25 Average  Matrix Reasoning  6 9 [place] Average  Visual Puzzles 7 16 [place] Average  Working Memory Index: 89 23 [place] Average  Digit Span 9 37 Average  Arithmetic  7 16 [place] Average  Processing Speed Index: 56 <1 Exceptionally Low  Symbol Search  1 <1 Exceptionally Low  Coding 3 1 Exceptionally Low         Memory:               Wechsler Memory Scale (WMS-IV):                       Raw Score (Scaled Score) Percentile    Logical Memory I 32/53 (10)  Average  Logical Memory II 18/39 (10)  Average  Logical Memory Recognition 13/23  Well [place] Average         Verbal Paired Associated I 0/80 (45)    Verbal Paired Associated II /16 (1)    Verbal Paired Associated Recognition  /16 (1)           Wechsler Memory Scale (WMS-IV):                       Raw Score (Scaled Score) Percentile    Visual Reproduction I / (10) 50 Average  Visual Reproduction II / (11) 63 Average   Visual Reproduction Recognition /7           Attention/Executive Function:               Trail Making Test (TMT): Raw Score (T Score) Percentile    Part A 124 secs., 1 errors (20) <1 Exceptionally Low  Part B 313 secs., 4 errors (21) <1 Exceptionally Low           Scaled Score Percentile    WAIS-IV Coding: 3 1 Exceptionally Low           Scaled Score Percentile    WAIS-IV Digit Span: 9 37 Average  Forward 9 37 Average  Backward 10 50 Average  Sequencing 8 25 Average           Age-Scaled Score Percentile    Wechsler  Memory Scale (WMS-IV) Symbol Span: 5 5 Well T[place] Average           Scaled Score Percentile    WAIS-IV Similarities: 9 37 Average                 Language:               Verbal Fluency Test: Raw Score (T Score) Percentile    Phonemic Fluency (FAS)      Animal Fluency 11 (34) 5 Well Isidro Average         Visuospatial/Visuoconstruction:          Raw Score Percentile    Clock Drawing: /10 --- 999           Scaled Score Percentile    WAIS-IV Block Design: 8 25 Average  WAIS-IV Matrix Reasoning: 6 9 [place] Average  WAIS-IV Visual Puzzles: 7 16 [place]ou Average         Sensory-Motor:               Hand Dynamometer: Raw Score Percentile    Dominant Hand (Rt.) 36.5 Kg - Average  Non-Dominant Hand 17 Kg - [place] Average         Mood and Personality:          Raw Score Percentile    Geriatric Depression Scale, Short Form: - ---   Geriatric Anxiety Scale: - ---   Somatic - ---   Cognitive - ---   Affective - ---          Additional Questionnaires:               Parkinson's Disease Questionnaire-39: Raw Score Percentile    Mobility - - -  Activities of Daily Living - - -  Emotional Well-Being - - -  Stigma - - -  Social Support - - -  Cognitions - - -  Communication - - -  Bodily Discomfort - - -           Index Score Summary  Index Sum of Scaled Scores Index Score Percentile Rank 95% Confidence Interval Qualitative Descriptor  Auditory Memory (AMI) 32 88 21 82-95 Low Average  Visual Memory (VMI) 21 102 55 97-107 Average  Immediate Memory (IMI) 26 91 27 85-98 Average  Delayed Memory (DMI) 27 93 32 86-101 Average   ABILITY-MEMORY ANALYSIS  Ability Score:  GAI: 93 Date of Testing:  WAIS-IV; WMS-IV 2020/11/17  Predicted Difference Method   Index Predicted WMS-IV Index Score Actual WMS-IV Index Score Difference Critical Value  Significant Difference Y/N Base Rate  Auditory Memory 96 88 8 9.22 N   Visual Memory 96 102 -6 8.25 N   Immediate Memory 95 91 4 10.13 N   Delayed Memory 96 93 3 11.43 N   Statistical significance (critical value) at the .01 level.

## 2021-01-24 NOTE — Progress Notes (Incomplete)
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 lights to reduce shadows. - Cover mirrors with a cloth or remove them if the person thinks that he or she is looking at a stranger.  Delusions (firmly held beliefs in things that are not real) may occur in middle-to-late-stage dementia. Confusion and memory loss - such as the inability to remember certain people or objects - can contribute to these untrue beliefs. A person with dementia may believe a family member is stealing his or her possessions or that he or she is being followed by the police. This kind of suspicious delusion is sometimes referred to as paranoia. Although not grounded in reality, the situation is very real to the person with dementia. Keep in mind that a person with dementia is trying to make sense of his or her world with declining cognitive function. A delusion is not the same thing as a hallucination. While delusions involve false beliefs, hallucinations are false perceptions of objects or events that are sensory in nature. The following strategies may be helpful in responding to  delusions: o Don't take offense. Listen to what is troubling the person, and try to understand that reality. Then be reassuring, and let the person know you care. o Don't argue or try to convince. Allow the individual to express ideas. Acknowledge his or her opinions. o Offer a simple answer. Share your thoughts with the individual, but keep it simple. Don't overwhelm the person with lengthy explanations or reasons. o Switch the focus to another activity. Engage the individual in an activity, or ask for help with a chore. o Duplicate any lost items. If the person is often searching for a specific item, have several available (if possible). For example, if the individual is always looking for his or her wallet, purchase two of the same kind.  If you have any questions, please contact us at [phone].   This report is intended solely for the confidential review and use by the referring professional to assist in diagnostic and medical decision making needs.  This report should not be released to a third party without proper consent. [NOTE: data can be made available to qualified professionals with  permission from the patient or legal representative/caregiver]    ____________________________________ Brandon Ellis, PsyD,  Licensed Psychologist (Provisional) Clinical Neuropsychologist     TEST SCORES:  Note: This summary of test scores accompanies the interpretive report and should not be considered in isolation without reference to the appropriate sections in the text. Descriptors are based on appropriate normative data and may be adjusted based on clinical judgment. The terms "impaired" and "within normal limits (WNL)" are used when a more specific level of functioning cannot be determined.    Validity Testing:     Descriptor         WAIS-IV Reliable Digit Span: --- --- Within Expectation         Intellectual Functioning:                 Standard Score Percentile    WRAT-4 Reading:  102 55 Average         Wechsler Adult Intelligence Scale (WAIS-IV):  Standard Score/ Scaled Score Percentile    Full Scale IQ  80 9 Soto Average  GAI 91 27 Average  Verbal Comprehension Index: 100 50 Average  Similarities  9 37 Average  Vocabulary 10 50 Average  Information  11 63 Average  Perceptual Reasoning Index:  82 12 Antonini Average  Block Design  8 25 Average  Matrix Reasoning  6 9 Holwerda Average  Visual Puzzles 7 16 Trejos Average  Working Memory Index: 89 23 Swire Average  Digit Span 9 37 Average  Arithmetic  7 16 Gallaway Average  Processing Speed Index: 56 <1 Exceptionally Low  Symbol Search  1 <1 Exceptionally Low  Coding 3 1 Exceptionally Low         Memory:              Raw Score (Scaled/Standard Score) Percentile    [place] Verbal Learning Test (CVLT-III), Standard Form:        Total Trials 1-5 25/80 (71) 3 Well Arbaugh Average  List B 5/16 (11) 63 Average  Short Delayed Free Recall 4/16 (6) 9 Kegley Average  Short Delayed Cued Recall 6/16 (6) 9 Menken Average  Long Delayed Free Recall 5/16 (6) 9 Gardiner Average  Long Delayed Cued Recall 8/16 (7) 16 Bargo Average  Discriminability   3 (34) 5 Well Gores Average       Recognition Hits  12/16/(6) 9 Brodt Average       False Positive Errors  15 (4) 2 Well Stoffer Average       Wechsler Memory Scale (WMS-IV): Older Adult                      Raw Score (Scaled Score) Percentile    Logical Memory I 32/53 (10) 50 Average  Logical Memory II 18/39 (10) 50 Average  Logical Memory Recognition 13/23 <2 Well Minteer Average         Verbal Paired Associated I 9/40 (6) 9 Pallett Average  Verbal Paired Associated II 3/10 (6) 9 Feely Average  Verbal Paired Associated Recognition  27/30 (1) 26-50 Average         Wechsler Memory Scale (WMS-IV):  Older Adult                      Raw Score (Scaled Score) Percentile    Visual Reproduction I 31/ (10) 50 Average  Visual Reproduction II 24/ (11) 63 Average   Visual Reproduction Recognition  4/7 26-50 Average  Raw Score (T-Score) Percentile   Rey-Osterrieth Complex Figure Test (RCFT):         Immediate Recall 14/36 (49) 46 Average      Delayed Recall 14.5/36 (50) 50 Average  Recognition Total Correct 20/24 (49) 46 Average      True Positives 9 >16 Within Normal Limits      FalseNegatives  1 >16 Within Normal Limits       Attention/Executive Function:               Trail Making Test (TMT): Raw Score  (T Score) Percentile    Part A 124 secs., 1 errors (20) <1 Exceptionally Low  Part B 313 secs., 4 errors (21) <1 Exceptionally Low           Scaled Score Percentile    WAIS-IV Coding: 3 1 Exceptionally Low           Scaled Score Percentile    WAIS-IV Digit Span: 9 37 Average  Forward 9 37 Average  Backward 10 50 Average  Sequencing 8 25 Average           Age-Scaled Score Percentile    Wechsler Memory Scale (WMS-IV) Symbol Span: 5 5 Well Aumiller Average           Scaled Score Percentile    WAIS-IV Similarities: 9 37 Average        Raw Score Percentile   [place] Card Sorting Test (WCST)         Categories (trials) 2 (128) 6-10 Well Kearn Average      Trials to complete 1st Category 90 2-5 Well Jerez Average      Total Errors 79 5 Well Raphael Average      % Perseverative Errors 35  12 Burgio Average      % Non-Perseverative Errors 27 5 Well Annas Average      Failure to Maintain Set 1 >16 Average         Language:               Verbal Fluency Test: Raw Score  (T Score) Percentile    Phonemic Fluency (FAS) 15 (32)  4 Well Fahl Average  Animal Fluency 11 (34) 5 Well Lythgoe Average        Raw Score  (T- Score) Percentile   [place] Naming Test         Total (w/ Armstrong) 51/60 (48) 42 Average         Visuospatial/Visuoconstruction:          Raw Score Percentile    Clock Drawing: /10 ---            Scaled Score Percentile    WAIS-IV Block Design: 8 25 Average  WAIS-IV Matrix Reasoning: 6 9 Charney Average  WAIS-IV Visual Puzzles: 7 16 Eynon Average          Sensory-Motor:               Raw Score Percentile    Hand Dynamometer:      Dominant Hand (Rt.) 36.5 Kg - Average  Non-Dominant Hand  17 Kg - Bowlby Average        Raw Score (T-Score) Percentile   Grooved Peboard         Dominant Hand (Rt. ) 122" (35) 7 Well Waldschmidt AVerage      Non-Dominant Hand  167" (30) 2 Well Omar Average        Raw Score (T-Score) Percentile   Finger Tapping Test  Dominant Hand (Rt.) 54 (59) 82 Above Average      Non-Dominant Hand  28 (25) 1 Exceptionally Low              Additional Questionnaires:               Raw Score Percentile    MDS UPDRS: Patient          Part 1: Non Motor Aspects of Experiences of     Daily Living       Cognitive Impairment  0 - Normal  Hallucinations and psychosis 0 - Normal  Depressed mood   0 - Normal  Anxious mood  1 - Slight Impairment   Apathy    0 - Normal  Features of DDS   0 - Normal  Sleep problems    4 - Severe Impairment   Daytime sleepiness     2 - Mild Impairment   Pain & other sensations  2 - Mild Impairment   Urinary problems  1 - Slight Impairment   Constipation problems 0 - Normal  Lightheadedness on standing  2 - Mild Impairment   Fatigue 1 - Slight Impairment   Part II: Motor Aspects of Experiences of     Daily Living      Speech  1 - Slight Impairment   Saliva & drooling 2 - Mild Impairment   Chewing & swallowing 0 - Normal  Eating Tasks 1 - Slight Impairment   Dressing 1 - Slight Impairment   Hygiene  1 - Slight Impairment   Handwriting 2 - Mild Impairment   Doing hobbies and other activities 3 - Moderate Impairment  Turning in bed 0 - Normal  Tremor (described as longstanding) 1 - Slight Impairment   Getting out of bed 2 - Mild Impairment   Walking & balance 3 - Moderate Impairment  Freezing  1 - Slight Impairment  Part III: Motor Examination     Speech (Part III)  2 - Mild Impairment   Facial Expression  3 - Moderate Impairment  Rigidity-Neck 3-4 - Moderate-Severe Impairment        Arising from chair 3 - Moderate Impairment  Gait 3 - Moderate Impairment  Freezing of gait 2 - Mild Impairment  Global spontaneity of movement  3 - Moderate Impairment         Index Score Summary  Index Sum of Scaled Scores Index Score Percentile Rank 95% Confidence Interval Qualitative Descriptor  Auditory Memory (AMI) 32 88 21 82-95 Low Average  Visual Memory (VMI) 21 102 55 97-107 Average  Immediate Memory (IMI) 26 91 27 85-98 Average  Delayed Memory (DMI) 27 93 32 86-101 Average    ABILITY-MEMORY ANALYSIS  Ability Score:  GAI: 93 Date of Testing:  WAIS-IV; WMS-IV 2020/11/17  Predicted Difference Method   Index Predicted WMS-IV Index Score Actual WMS-IV Index Score Difference Critical Value  Significant Difference Y/N Base Rate  Auditory Memory 96 88 8 9.22 N   Visual Memory 96 102 -6 8.25 N   Immediate Memory 95 91 4 10.13 N   Delayed Memory 96 93 3 11.43 N   Statistical significance (critical value) at the .01 level.

## 2021-01-25 NOTE — Progress Notes (Addendum)
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 times, and dates. . To aid in managing problems with attention, the patient may consider using some of the following strategies: o The patient should simplify tasks.  There may be a need to break overly complex activities into simple step-by-step tasks, keep these steps written down in a note book and then check them off as they are completed which will help to stay on task and make sure the whole task is finished.  o The patient should set deadlines for everything, even for seemingly small tasks, prioritize time-sensitive tasks and write down every assignment, message, or important thought. o The patient is encouraged to use timers and alarms to stay on track and take breaks at regular intervals. Avoid piles of paperwork or procrastination by dealing with each item as it comes in.  Community resources are available for those diagnosed with atypical parkinsonism, including progressive supranuclear palsy and corticobasal degeneration. There is a support group for PSP/CBD/MSA patients and caregivers in [place] at the [facility]. Contact [phone] for more information. In addition, [employer] is an organization that provides information, resources, and online  support groups for individuals diagnosed with PSP and other related disorders, and can be accessed at [employer].  Techniques of communicating with a person who has dementia/memory impairment:  . Maintain a regular schedule for as many activities as possible. . Practice reality orientation. . Repeat information quietly and firmly. Marland Kitchen Keep the voice low and calm and be rational and understanding. . Talk in a warm and encouraging manner. . Talk gently and calmly; smile and be relaxed. . Use normal voice tone, do not be condescending. . Talk in a quiet place without distraction. . Show respect and acceptance. . Use one sentence for each idea and check to see if the patient understands before proceeding. . Do not interrupt, signal acceptance of message by nods, or smiles when appropriate, and repeat what the patient has said when the message is complete. Marland Kitchen Keep stress, arguing, disagreements, and verbal tirades to a minimum, as any additional stressor on the patient will cause continued and more rapid deterioration.  In general, the best way to manage the behavioral and psychological symptoms of cognitive decline such as agitation involves behavioral strategies such as using the three R's.  o Redirection (help distract your loved one by focusing their attention on something else, moving them to a new environment, or otherwise engaging them in something other than what is distressing to them)  o Reassurance (reassure them that you are there to take care of them and that there is nothing they need to be worried about), and  o Reconsidering (consider the situation from their perspective and try to identify if there is something about the situation or environment that may be triggering their reaction).  If you have any questions, please contact us at [phone].   This report is intended solely for the confidential review and use by the referring professional to assist in diagnostic and medical  decision making needs.  This report should not be released to a third party without proper consent. [NOTE: data  can be made available to qualified professionals with permission from the patient or legal representative/caregiver]    ____________________________________ Alfonso Ellis, PsyD,  Licensed Psychologist (Provisional) Clinical Neuropsychologist     TEST SCORES:  Note: This summary of test scores accompanies the interpretive report and should not be considered in isolation without reference to the appropriate sections in the text. Descriptors are based on appropriate normative data and may be adjusted based on clinical judgment. The terms "impaired" and "within normal limits (WNL)" are used when a more specific level of functioning cannot be determined.    Validity Testing:     Descriptor         WAIS-IV Reliable Digit Span: --- --- Within Expectation         Intellectual Functioning:                 Standard Score Percentile    WRAT-4 Reading: 102 55 Average         Wechsler Adult Intelligence Scale (WAIS-IV):  Standard Score/ Scaled Score Percentile    Full Scale IQ  80 9 Mastrogiovanni Average  GAI 91 27 Average  Verbal Comprehension Index: 100 50 Average  Similarities  9 37 Average  Vocabulary 10 50 Average  Information  11 63 Average  Perceptual Reasoning Index:  82 12 Arreguin Average  Block Design  8 25 Average  Matrix Reasoning  6 9 Fackler Average  Visual Puzzles 7 16 Paez Average  Working Memory Index: 89 23 Fedora Average  Digit Span 9 37 Average  Arithmetic  7 16 Wilkinson Average  Processing Speed Index: 56 <1 Exceptionally Low  Symbol Search  1 <1 Exceptionally Low  Coding 3 1 Exceptionally Low         Memory:              Raw Score (Scaled/Standard Score) Percentile    [place] Verbal Learning Test (CVLT-III), Standard Form:        Total Trials 1-5 25/80 (71) 3 Well Metzger Average  List B 5/16 (11) 63 Average  Short Delayed Free Recall 4/16 (6) 9 Vanessen Average   Short Delayed Cued Recall 6/16 (6) 9 Kyllonen Average  Long Delayed Free Recall 5/16 (6) 9 Knoblock Average  Long Delayed Cued Recall 8/16 (7) 16 Hulgan Average  Discriminability   3 (34) 5 Well Cremeans Average       Recognition Hits  12/16/(6) 9 Hammontree Average       False Positive Errors  15 (4) 2 Well Chastang Average       Wechsler Memory Scale (WMS-IV): Older Adult                      Raw Score (Scaled Score) Percentile    Logical Memory I 32/53 (10) 50 Average  Logical Memory II 18/39 (10) 50 Average  Logical Memory Recognition 13/23 <2 Well Gonnella Average         Verbal Paired Associated I 9/40 (6) 9 Morgan Average  Verbal Paired Associated II 3/10 (6) 9 Mcafee Average  Verbal Paired Associated Recognition  27/30 (1) 26-50 Average         Wechsler Memory Scale (WMS-IV):  Older Adult                      Raw Score (Scaled Score) Percentile    Visual Reproduction I 31/ (10) 50 Average  Visual Reproduction II 24/ (11) 63 Average   Visual Reproduction Recognition 4/7  26-50 Average        Raw Score (T-Score) Percentile   Rey-Osterrieth Complex Figure Test (RCFT):         Immediate Recall 14/36 (49) 46 Average      Delayed Recall 14.5/36 (50) 50 Average  Recognition Total Correct 20/24 (49) 46 Average      True Positives 9 >16 Within Normal Limits      FalseNegatives  1 >16 Within Normal Limits       Attention/Executive Function:               Trail Making Test (TMT): Raw Score  (T Score) Percentile    Part A 124 secs., 1 errors (20) <1 Exceptionally Low  Part B 313 secs., 4 errors (21) <1 Exceptionally Low           Scaled Score Percentile    WAIS-IV Coding: 3 1 Exceptionally Low           Scaled Score Percentile    WAIS-IV Digit Span: 9 37 Average  Forward 9 37 Average  Backward 10 50 Average  Sequencing 8 25 Average           Age-Scaled Score Percentile    Wechsler Memory Scale (WMS-IV) Symbol Span: 5 5 Well Naff Average           Scaled Score Percentile    WAIS-IV  Similarities: 9 37 Average        Raw Score Percentile   [place]consin Card Sorting Test (WCST)         Categories (trials) 2 (128) 6-10 Well Corl Average      Trials to complete 1st Category 90 2-5 Well How Average      Total Errors 79 5 Well Stipe Average      % Perseverative Errors 35  12 Ticer Average      % Non-Perseverative Errors 27 5 Well Morabito Average      Failure to Maintain Set 1 >16 Average       RCFT Time to Copy 480" 2-5 Well Fredricksen Average         Language:               Verbal Fluency Test: Raw Score  (T Score) Percentile    Phonemic Fluency (FAS) 15 (32)  4 Well Reasor Average  Animal Fluency 11 (34) 5 Well Soltau Average        Raw Score  (T- Score) Percentile   [place] Naming Test         Total (w/ Lusk) 51/60 (48) 42 Average         Visuospatial/Visuoconstruction:          Raw Score Percentile    Clock Drawing: 10/10 --- Within Normal Limits       RCFT Copy Trial 27.5/36 2-5 Well Silverman Average           Scaled Score Percentile    WAIS-IV Block Design: 8 25 Average  WAIS-IV Matrix Reasoning: 6 9 Boteler Average  WAIS-IV Visual Puzzles: 7 16 Tvedt Average         Sensory-Motor:               Raw Score Percentile    Hand Dynamometer:      Dominant Hand (Rt.) 36.5 Kg - Average  Non-Dominant Hand  17 Kg - Barre Average        Raw Score (T-Score) Percentile   Grooved Peboard         Dominant  Hand (Rt. ) 122" (35) 7 Well Meraz AVerage      Non-Dominant Hand  167" (30) 2 Well Lemere Average        Raw Score (T-Score) Percentile   Finger Tapping Test         Dominant Hand (Rt.) 54 (59) 82 Above Average      Non-Dominant Hand  28 (25) 1 Exceptionally Low              Additional Questionnaires:               Raw Score Percentile    MDS UPDRS: Patient          Part 1: Non Motor Aspects of Experiences of     Daily Living       Cognitive Impairment  0 - Normal  Hallucinations and psychosis 0 - Normal  Depressed mood   0 - Normal  Anxious mood  1 - Slight  Impairment   Apathy    0 - Normal  Features of DDS   0 - Normal  Sleep problems    4 - Severe Impairment   Daytime sleepiness     2 - Mild Impairment   Pain & other sensations  2 - Mild Impairment   Urinary problems  1 - Slight Impairment   Constipation problems 0 - Normal  Lightheadedness on standing  2 - Mild Impairment   Fatigue 1 - Slight Impairment   Part II: Motor Aspects of Experiences of     Daily Living      Speech  1 - Slight Impairment   Saliva & drooling 2 - Mild Impairment   Chewing & swallowing 0 - Normal  Eating Tasks 1 - Slight Impairment   Dressing 1 - Slight Impairment   Hygiene  1 - Slight Impairment   Handwriting 2 - Mild Impairment   Doing hobbies and other activities 3 - Moderate Impairment  Turning in bed 0 - Normal  Tremor (described as longstanding) 1 - Slight Impairment   Getting out of bed 2 - Mild Impairment   Walking & balance 3 - Moderate Impairment  Freezing  1 - Slight Impairment  Part III: Motor Examination     Speech (Part III)  2 - Mild Impairment   Facial Expression  3 - Moderate Impairment  Rigidity-Neck, UE, LE 3-4 - Moderate-Severe Impairment       Arising from chair 3 - Moderate Impairment  Gait 3 - Moderate Impairment  Freezing of gait 2 - Mild Impairment  Global spontaneity of movement  3 - Moderate Impairment         Index Score Summary  Index Sum of Scaled Scores Index Score Percentile Rank 95% Confidence Interval Qualitative Descriptor  Auditory Memory (AMI) 32 88 21 82-95 Low Average  Visual Memory (VMI) 21 102 55 97-107 Average  Immediate Memory (IMI) 26 91 27 85-98 Average  Delayed Memory (DMI) 27 93 32 86-101 Average    ABILITY-MEMORY ANALYSIS  Ability Score:  GAI: 93 Date of Testing:  WAIS-IV; WMS-IV 2020/11/17  Predicted Difference Method   Index Predicted WMS-IV Index Score Actual WMS-IV Index Score Difference Critical Value  Significant Difference Y/N Base Rate  Auditory Memory 96 88 8 9.22 N   Visual  Memory 96 102 -6 8.25 N   Immediate Memory 95 91 4 10.13 N   Delayed Memory 96 93 3 11.43 N   Statistical significance (critical value) at the .01 level.    Feedback to  Patient: Nathanel Tallman Sze will return for another feedback appointment within 2 weeks o review the most recentresults of his neuropsychological evaluation with this provider.  Thank you for your referral of Mahlon Gabrielle Bressi. Please feel free to contact me if you have any questions or concerns regarding this report.

## 2021-01-28 ENCOUNTER — Encounter: Payer: Medicare HMO | Admitting: Psychology

## 2021-02-09 ENCOUNTER — Other Ambulatory Visit: Payer: Self-pay

## 2021-02-09 ENCOUNTER — Encounter: Payer: Medicare HMO | Attending: Psychology | Admitting: Psychology

## 2021-02-09 ENCOUNTER — Encounter: Payer: Self-pay | Admitting: Psychology

## 2021-02-09 DIAGNOSIS — F0390 Unspecified dementia without behavioral disturbance: Secondary | ICD-10-CM

## 2021-02-09 DIAGNOSIS — F09 Unspecified mental disorder due to known physiological condition: Secondary | ICD-10-CM | POA: Diagnosis present

## 2021-02-09 DIAGNOSIS — G231 Progressive supranuclear ophthalmoplegia [Steele-Richardson-Olszewski]: Secondary | ICD-10-CM | POA: Diagnosis present

## 2021-02-09 NOTE — Progress Notes (Addendum)
Neuropsychology Feedback Appointment  Brandon Vargas returned for a feedback appointment today to review the results of his recent neuropsychological evaluation with this provider. 60 minutes face-to-face time was spent reviewing his test results, my impressions and my recommendations as detailed in his report. 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 The patient was given the opportunity to ask questions, and I did my best to answer these to his satisfaction. Patient will return for repeat neuropsychological evaluation in 9-12 months (or sooner with any significant change in status) to monitor his symptoms over time and document any potential improvement or decline in cognitive functioning.   I spent another 60 minutes on phone with patient's brother reviewing test results, my impressions and my recommendations as detailed in patient's report. Plan to refer patient to movement disorder specialist to receive specialized treatment and proper management for his condition.   Bleiler is a copy of my clinical impressions and recommendations from the evaluation (dated 01/21/21 in EMR). The initial consult can be found in EMR 10/08/21.    SUMMARY & IMPRESSION: Brandon Vargas current overall cognitive functioning was significantly Umbaugh estimated premorbid intellectual ability. He displayed exceptionally low and impaired information processing speed, motor coordination (fine and gross; non-dominant side more impacted), and aspects of executive functioning including set-shifting, cognitive flexibility, and problem solving. He displayed relative weakness in complex constructional praxis, verbal fluency, and new unstructured verbal learning.  Performances that were relatively intact included aspects of language (e.g., verbal reasoning, vocabulary, general fund of knowledge, confrontational naming), basic auditory attention and working memory,  as well as delayed memory (e.g., consolidation and retrieval).   There is evidence that his cognitive and motor deficits are interfering with his ability to manage many daily activities including managing finances, medication, and driving. He currently resides in an assisted living facility due to problems living independently and safely. He displays problems with balance and festinating gait. Behaviorally he showed signs of executive dysfunction including impulsivity. 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  The presence of hyperreflexia and spasticity suggest upper motor neuron disease. Unresolved mood disorders may exacerbate current cognitive difficulties; however, it is highly unlikely that mood alone could explain the pattern of deficits observed on testing.  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 Cardinal symptoms of lewy body disease or dementia (LBD), DLBD, PDD, AD, and LBV are not present at this time but could emerge in near future. Nuclear neuroimaging techniques (i.e., PET or Dat-SPECT) would likely clarify the  clinical picture and help rule out LBD, other atypical parkinson's syndromes (e.g., MSA, CBS, etc.) and ALS.   Final diagnostic impression will be rendered by movement disorder specialist at the time of follow-up, integrating the present neuropsychological findings with the results from neurological examination, neuroimaging, and laboratory studies.   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 Follow-up with Eustace Quail. Tat, DO (Neurology), a movement disorder specialist at [facility].  . Expect limited response to dopaminergic or anticholinergic agents; emotional symptoms can be treated with antidepressants . While the patient had a recent Brain MRI (10/03/2019) that appeared relatively normal for age,  other types of imaging, such as PET or (Dat) SPECT scans may be useful to measure activity in the brain by monitoring blood flow and glucose/oxygen usage. More specifically, nuclear medicine methods can be used to confirm symmetric presynaptic nigrostriatal dopaminergic denervation (such as, FP-CIT-SPECT), postsynaptic striatal degeneration (such as, IBZM-SPECT), or hypometabolism in the frontal and midbrain regions (F-FDG-PET) to help differentiate diagnosis.  . Non-pharmacological treatment:  - Exercise and stretching - Voice therapy - Speech/swallowing therapy   - Balance-related exercises - Occupational therapy  2. This individual appears to be experiencing clinical symptoms of anxiety.  As such, engaging in psychotherapy should be considered to better manage mood symptomology. Therein the patient can work on identifying triggers for anxiety as well as work towards developing healthy coping mechanisms and implementing lifestyle modifications that can  help to support ongoing mental health.    3. Due to the nature and severity of the symptoms noted during this evaluation, it is recommended that the patient remain under 24-hour care and supervision, as the cognitive deficits noted represent a safety risk if left alone for extended periods of time. He currently resides in an ALF. As such, no changes are needed in living situation.   4. Continued assistance with activities of daily living (e.g., medication and financial management) is recommended.    5. It is recommended that the severity of the patient's impairments is considered when assessing the level of asset management required. For example, given impairment higher-level reasoning and problem-solving skills, it is recommended that the patient consult with a family member or another trusted advisor prior to making any important medical, legal, or financial decisions. Establishing or continuing to rely upon someone who has Power of Musician and medical decision-making is recommended.    6. The patient should continue to refrain from driving, as deficits noted on testing could affect one's ability to safely operate a motor vehicle.    7. The patient is encouraged to attend to lifestyle factors for brain health (e.g., regular physical exercise, good nutrition habits, regular participation in cognitively-stimulating activities, and general stress management techniques), which are likely to have benefits for both emotional adjustment and cognition.  In fact, in addition to promoting general good health, regular exercise incorporating aerobic activities (e.g., walking, etc.) has been demonstrated to be a very effective treatment for depression and stress, with similar efficacy rates to both antidepressant medication and psychotherapy. And for those with orthopedic issues, water aerobics may be particularly beneficial.  8. Neuropsychological re-evaluation is recommended in  approximately one year (or sooner) to monitor treatment efficacy, to assist with the management of the patient (start or continue rehab or pharmacological therapy), to determine any clinical and functional significance of brain abnormality over time, as well as to document any potential improvement or decline in cognitive functioning. Lastly, any follow-up testing will help delineate the specific cognitive basis of any new functional complaints.   The following are several strategies that may help:  . Performance will generally be best in a structured, routine, and familiar environment, as opposed to situations involving complex problems.  Marland Kitchen  Designate a place to keep your keys, wallet, cell phone, and other personal belongings. . Take time to register and process information to be remembered. Deeper encoding of information can be gained by forming a mental picture, making meaningful associations, connecting new information to previously learned and related information, paraphrasing and repetition.  . To the extent possible, multitasking should be avoided; break down tasks into smaller steps to help get started and to keep from feeling overwhelmed. And if there are difficulties in organization and planning, maintaining a daily organizer to help keep track of important appointments and information may be beneficial.   . Memory problems may at least be minimally addressed using compensatory strategies such as the use of a daily schedule to follow, memos, portable recorder, a centrally located bulletin board, or memory notebook. A large calendar, placed in a highly visible location would be valuable to keep track of dates and appointments.  In addition, it would be helpful to keep a log of all of medical appointments with the name of the doctor, date of visit, diagnoses, and treatments.  . Use of a medication box is recommended to ensure compliance and decrease confusion regarding medication dosages, times, and  dates. . To aid in managing problems with attention, the patient may consider using some of the following strategies: o The patient should simplify tasks.  There may be a need to break overly complex activities into simple step-by-step tasks, keep these steps written down in a note book and then check them off as they are completed which will help to stay on task and make sure the whole task is finished.  o The patient should set deadlines for everything, even for seemingly small tasks, prioritize time-sensitive tasks and write down every assignment, message, or important thought. o The patient is encouraged to use timers and alarms to stay on track and take breaks at regular intervals. Avoid piles of paperwork or procrastination by dealing with each item as it comes in.  Community resources are available for those diagnosed with atypical parkinsonism, including progressive supranuclear palsy and corticobasal degeneration. There is a support group for PSP/CBD/MSA patients and caregivers in [place] at the [facility]. Contact [phone] for more information. In addition, [employer] is an organization that provides information, resources, and online support groups for individuals diagnosed with PSP and other related disorders, and can be accessed at [employer].  Techniques of communicating with a person who has dementia/memory impairment:  . Maintain a regular schedule for as many activities as possible. . Practice reality orientation. . Repeat information quietly and firmly. Marland Kitchen Keep the voice low and calm and be rational and understanding. . Talk in a warm and encouraging manner. . Talk gently and calmly; smile and be relaxed. . Use normal voice tone, do not be condescending. . Talk in a quiet place without distraction. . Show respect and acceptance. . Use one sentence for each idea and check to see if the patient understands before proceeding. . Do not interrupt,  signal acceptance of message by nods, or smiles when appropriate, and repeat what the patient has said when the message is complete. Marland Kitchen Keep stress, arguing, disagreements, and verbal tirades to a minimum, as any additional stressor on the patient will cause continued and more rapid deterioration.  In general, the best way to manage the behavioral and psychological symptoms of cognitive decline such as agitation involves behavioral strategies such as using the three R's.  o Redirection (help distract your loved one  by focusing their attention on something else, moving them to a new environment, or otherwise engaging them in something other than what is distressing to them)  o Reassurance (reassure them that you are there to take care of them and that there is nothing they need to be worried about), and  o Reconsidering (consider the situation from their perspective and try to identify if there is something about the situation or environment that may be triggering their reaction).  If you have any questions, please contact us at [phone].   This report is intended solely for the confidential review and use by the referring professional to assist in diagnostic and medical decision making needs.  This report should not be released to a third party without proper consent. [NOTE: data can be made available to qualified professionals with permission from the patient or legal representative/caregiver]    ____________________________________ Alfonso Ellis, PsyD,  Licensed Psychologist (Provisional) Clinical Neuropsychologist     TEST SCORES:  Note: This summary of test scores accompanies the interpretive report and should not be considered in isolation without reference to the appropriate sections in the text. Descriptors are based on appropriate normative data and may be adjusted based on clinical judgment. The terms "impaired" and "within normal limits (WNL)" are used when a more specific  level of functioning cannot be determined.    Validity Testing:     Descriptor         WAIS-IV Reliable Digit Span: --- --- Within Expectation         Intellectual Functioning:                 Standard Score Percentile    WRAT-4 Reading: 102 55 Average         Wechsler Adult Intelligence Scale (WAIS-IV):  Standard Score/ Scaled Score Percentile    Full Scale IQ  80 9 Frosch Average  GAI 91 27 Average  Verbal Comprehension Index: 100 50 Average  Similarities  9 37 Average  Vocabulary 10 50 Average  Information  11 63 Average  Perceptual Reasoning Index:  82 12 Towe Average  Block Design  8 25 Average  Matrix Reasoning  6 9 Calixto Average  Visual Puzzles 7 16 Joines Average  Working Memory Index: 89 23 Wiltgen Average  Digit Span 9 37 Average  Arithmetic  7 16 Mccraw Average  Processing Speed Index: 56 <1 Exceptionally Low  Symbol Search  1 <1 Exceptionally Low  Coding 3 1 Exceptionally Low         Memory:              Raw Score (Scaled/Standard Score) Percentile    [place] Verbal Learning Test (CVLT-III), Standard Form:        Total Trials 1-5 25/80 (71) 3 Well Frieson Average  List B 5/16 (11) 63 Average  Short Delayed Free Recall 4/16 (6) 9 Rodda Average  Short Delayed Cued Recall 6/16 (6) 9 Pascuzzi Average  Long Delayed Free Recall 5/16 (6) 9 Peckinpaugh Average  Long Delayed Cued Recall 8/16 (7) 16 Burgoon Average  Discriminability   3 (34) 5 Well Sur Average       Recognition Hits  12/16/(6) 9 Godinho Average       False Positive Errors  15 (4) 2 Well Rohner Average       Wechsler Memory Scale (WMS-IV): Older Adult  Raw Score (Scaled Score) Percentile    Logical Memory I 32/53 (10) 50 Average  Logical Memory II 18/39 (10) 50 Average  Logical Memory Recognition 13/23 <2 Well Mendibles Average         Verbal Paired Associated I 9/40 (6) 9 Patti Average  Verbal Paired Associated II 3/10 (6) 9 Noe Average  Verbal Paired Associated Recognition  27/30 (1) 26-50  Average         Wechsler Memory Scale (WMS-IV):  Older Adult                      Raw Score (Scaled Score) Percentile    Visual Reproduction I 31/ (10) 50 Average  Visual Reproduction II 24/ (11) 63 Average   Visual Reproduction Recognition 4/7 26-50 Average        Raw Score (T-Score) Percentile   Rey-Osterrieth Complex Figure Test (RCFT):         Immediate Recall 14/36 (49) 46 Average      Delayed Recall 14.5/36 (50) 50 Average  Recognition Total Correct 20/24 (49) 46 Average      True Positives 9 >16 Within Normal Limits      FalseNegatives  1 >16 Within Normal Limits       Attention/Executive Function:               Trail Making Test (TMT): Raw Score  (T Score) Percentile    Part A 124 secs., 1 errors (20) <1 Exceptionally Low  Part B 313 secs., 4 errors (21) <1 Exceptionally Low           Scaled Score Percentile    WAIS-IV Coding: 3 1 Exceptionally Low           Scaled Score Percentile    WAIS-IV Digit Span: 9 37 Average  Forward 9 37 Average  Backward 10 50 Average  Sequencing 8 25 Average           Age-Scaled Score Percentile    Wechsler Memory Scale (WMS-IV) Symbol Span: 5 5 Well Kress Average           Scaled Score Percentile    WAIS-IV Similarities: 9 37 Average        Raw Score Percentile   Wisconsin Card Sorting Test (WCST)         Categories (trials) 2 (128) 6-10 Well Tedeschi Average      Trials to complete 1st Category 90 2-5 Well Patnode Average      Total Errors 79 5 Well Semple Average      % Perseverative Errors 35  12 Denherder Average      % Non-Perseverative Errors 27 5 Well Costilla Average      Failure to Maintain Set 1 >16 Average       RCFT Time to Copy 480" 2-5 Well Riemenschneider Average         Language:               Verbal Fluency Test: Raw Score  (T Score) Percentile    Phonemic Fluency (FAS) 15 (32)  4 Well Planck Average  Animal Fluency 11 (34) 5 Well Snare Average        Raw Score  (T- Score) Percentile   [place] Naming Test         Total (w/ [place])  51/60 (48) 42 Average         Visuospatial/Visuoconstruction:          Raw Score Percentile  Clock Drawing: 10/10 --- Within Normal Limits       RCFT Copy Trial 27.5/36 2-5 Well Sagona Average           Scaled Score Percentile    WAIS-IV Block Design: 8 25 Average  WAIS-IV Matrix Reasoning: 6 9 Sciarra Average  WAIS-IV Visual Puzzles: 7 16 Kramp Average         Sensory-Motor:               Raw Score Percentile    Hand Dynamometer:      Dominant Hand (Rt.) 36.5 Kg - Average  Non-Dominant Hand  17 Kg - Myre Average        Raw Score (T-Score) Percentile   Grooved Peboard         Dominant Hand (Rt. ) 122" (35) 7 Well Derstine AVerage      Non-Dominant Hand  167" (30) 2 Well Kunzler Average        Raw Score (T-Score) Percentile   Finger Tapping Test         Dominant Hand (Rt.) 54 (59) 82 Above Average      Non-Dominant Hand  28 (25) 1 Exceptionally Low              Additional Questionnaires:               Raw Score Percentile    MDS UPDRS: Patient          Part 1: Non Motor Aspects of Experiences of     Daily Living       Cognitive Impairment  0 - Normal  Hallucinations and psychosis 0 - Normal  Depressed mood   0 - Normal  Anxious mood  1 - Slight Impairment   Apathy    0 - Normal  Features of DDS   0 - Normal  Sleep problems    4 - Severe Impairment   Daytime sleepiness     2 - Mild Impairment   Pain & other sensations  2 - Mild Impairment   Urinary problems  1 - Slight Impairment   Constipation problems 0 - Normal  Lightheadedness on standing  2 - Mild Impairment   Fatigue 1 - Slight Impairment   Part II: Motor Aspects of Experiences of     Daily Living      Speech  1 - Slight Impairment   Saliva & drooling 2 - Mild Impairment   Chewing & swallowing 0 - Normal  Eating Tasks 1 - Slight Impairment   Dressing 1 - Slight Impairment   Hygiene  1 - Slight Impairment   Handwriting 2 - Mild Impairment   Doing hobbies and other activities 3 - Moderate Impairment  Turning in  bed 0 - Normal  Tremor (described as longstanding) 1 - Slight Impairment   Getting out of bed 2 - Mild Impairment   Walking & balance 3 - Moderate Impairment  Freezing  1 - Slight Impairment  Part III: Motor Examination     Speech (Part III)  2 - Mild Impairment   Facial Expression  3 - Moderate Impairment  Rigidity-Neck, UE, LE 3-4 - Moderate-Severe Impairment       Arising from chair 3 - Moderate Impairment  Gait 3 - Moderate Impairment  Freezing of gait 2 - Mild Impairment  Global spontaneity of movement  3 - Moderate Impairment         Index Score Summary  Index Sum of Scaled Scores Index Score Percentile Rank  95% Confidence Interval Financial planner Memory (AMI) 32 88 21 82-95 Low Average  Visual Memory (VMI) 21 102 55 97-107 Average  Immediate Memory (IMI) 26 91 27 85-98 Average  Delayed Memory (DMI) 27 93 32 86-101 Average    ABILITY-MEMORY ANALYSIS  Ability Score:  GAI: 93 Date of Testing:  WAIS-IV; WMS-IV 2020/11/17  Predicted Difference Method   Index Predicted WMS-IV Index Score Actual WMS-IV Index Score Difference Critical Value  Significant Difference Y/N Base Rate  Auditory Memory 96 88 8 9.22 N   Visual Memory 96 102 -6 8.25 N   Immediate Memory 95 91 4 10.13 N   Delayed Memory 96 93 3 11.43 N   Statistical significance (critical value) at the .01 level.

## 2021-04-16 ENCOUNTER — Telehealth: Payer: Self-pay | Admitting: Psychology

## 2021-04-16 DIAGNOSIS — F0390 Unspecified dementia without behavioral disturbance: Secondary | ICD-10-CM

## 2021-04-16 NOTE — Telephone Encounter (Signed)
AMB referral to movement disorder specialist for second opinion and speciality services

## 2021-09-28 ENCOUNTER — Encounter (HOSPITAL_BASED_OUTPATIENT_CLINIC_OR_DEPARTMENT_OTHER): Payer: Self-pay

## 2021-09-28 ENCOUNTER — Ambulatory Visit (HOSPITAL_BASED_OUTPATIENT_CLINIC_OR_DEPARTMENT_OTHER): Payer: Medicare HMO | Admitting: Family Medicine

## 2021-09-29 ENCOUNTER — Ambulatory Visit (INDEPENDENT_AMBULATORY_CARE_PROVIDER_SITE_OTHER): Payer: Medicare HMO | Admitting: Family Medicine

## 2021-09-29 ENCOUNTER — Other Ambulatory Visit: Payer: Self-pay

## 2021-09-29 ENCOUNTER — Encounter (HOSPITAL_BASED_OUTPATIENT_CLINIC_OR_DEPARTMENT_OTHER): Payer: Self-pay | Admitting: Family Medicine

## 2021-09-29 VITALS — BP 136/75 | HR 69 | Ht 73.5 in | Wt 193.0 lb

## 2021-09-29 DIAGNOSIS — F339 Major depressive disorder, recurrent, unspecified: Secondary | ICD-10-CM

## 2021-09-29 DIAGNOSIS — G4700 Insomnia, unspecified: Secondary | ICD-10-CM

## 2021-09-29 DIAGNOSIS — E538 Deficiency of other specified B group vitamins: Secondary | ICD-10-CM | POA: Diagnosis not present

## 2021-09-29 DIAGNOSIS — E119 Type 2 diabetes mellitus without complications: Secondary | ICD-10-CM | POA: Diagnosis not present

## 2021-09-29 DIAGNOSIS — R419 Unspecified symptoms and signs involving cognitive functions and awareness: Secondary | ICD-10-CM | POA: Diagnosis not present

## 2021-09-29 DIAGNOSIS — F419 Anxiety disorder, unspecified: Secondary | ICD-10-CM

## 2021-09-29 DIAGNOSIS — R7301 Impaired fasting glucose: Secondary | ICD-10-CM

## 2021-09-29 DIAGNOSIS — R739 Hyperglycemia, unspecified: Secondary | ICD-10-CM

## 2021-09-30 ENCOUNTER — Telehealth: Payer: Self-pay | Admitting: Psychology

## 2021-09-30 NOTE — Telephone Encounter (Signed)
RECEIVED voicemail from gene Ralston POA brother for p;tn [phone]-- left voicemail to return call at 1:10pm ---LVM on machine

## 2021-10-02 LAB — LIPID PANEL
Chol/HDL Ratio: 2.7 ratio (ref 0.0–5.0)
Cholesterol, Total: 122 mg/dL (ref 100–199)
HDL: 45 mg/dL (ref >39)
LDL Chol Calc (NIH): 60 mg/dL (ref 0–99)
Triglycerides: 87 mg/dL (ref 0–149)
VLDL Cholesterol Cal: 17 mg/dL (ref 5–40)

## 2021-10-02 LAB — COMPREHENSIVE METABOLIC PANEL
ALT: 35 IU/L (ref 0–44)
AST: 44 IU/L — ABNORMAL HIGH (ref 0–40)
Albumin/Globulin Ratio: 1.6 (ref 1.2–2.2)
Albumin: 4.3 g/dL (ref 3.8–4.8)
Alkaline Phosphatase: 87 IU/L (ref 44–121)
BUN/Creatinine Ratio: 17 (ref 10–24)
BUN: 16 mg/dL (ref 8–27)
Bilirubin Total: 0.3 mg/dL (ref 0.0–1.2)
CO2: 24 mmol/L (ref 20–29)
Calcium: 9.4 mg/dL (ref 8.6–10.2)
Chloride: 101 mmol/L (ref 96–106)
Creatinine, Ser: 0.93 mg/dL (ref 0.76–1.27)
Globulin, Total: 2.7 g/dL (ref 1.5–4.5)
Glucose: 173 mg/dL — ABNORMAL HIGH (ref 70–99)
Potassium: 4.2 mmol/L (ref 3.5–5.2)
Sodium: 134 mmol/L (ref 134–144)
Total Protein: 7 g/dL (ref 6.0–8.5)
eGFR: 90 mL/min/{1.73_m2} (ref >59)

## 2021-10-02 LAB — CBC WITH DIFFERENTIAL
Basophils Absolute: 0 10*3/uL (ref 0.0–0.2)
Basos: 1 %
EOS (ABSOLUTE): 0.1 10*3/uL (ref 0.0–0.4)
Eos: 1 %
Hematocrit: 45.3 % (ref 37.5–51.0)
Hemoglobin: 15.4 g/dL (ref 13.0–17.7)
Immature Grans (Abs): 0 10*3/uL (ref 0.0–0.1)
Immature Granulocytes: 0 %
Lymphocytes Absolute: 1.6 10*3/uL (ref 0.7–3.1)
Lymphs: 20 %
MCH: 28.8 pg (ref 26.6–33.0)
MCHC: 34 g/dL (ref 31.5–35.7)
MCV: 85 fL (ref 79–97)
Monocytes Absolute: 0.4 10*3/uL (ref 0.1–0.9)
Monocytes: 5 %
Neutrophils Absolute: 5.6 10*3/uL (ref 1.4–7.0)
Neutrophils: 73 %
RBC: 5.35 x10E6/uL (ref 4.14–5.80)
RDW: 12.5 % (ref 11.6–15.4)
WBC: 7.7 10*3/uL (ref 3.4–10.8)

## 2021-10-02 LAB — TSH+T4F+T3FREE
Free T4: 1.12 ng/dL (ref 0.82–1.77)
T3, Free: 3.4 pg/mL (ref 2.0–4.4)
TSH: 0.989 u[IU]/mL (ref 0.450–4.500)

## 2021-10-02 LAB — HEMOGLOBIN A1C
Est. average glucose Bld gHb Est-mCnc: 146 mg/dL
Hgb A1c MFr Bld: 6.7 % — ABNORMAL HIGH (ref 4.8–5.6)

## 2021-10-02 LAB — VITAMIN B12: Vitamin B-12: 1255 pg/mL — ABNORMAL HIGH (ref 232–1245)

## 2021-10-04 NOTE — Assessment & Plan Note (Signed)
Patient has been trying to arrange appointment with specialist, has had difficulty doing so Will place referral to neurology, to have evaluation completed with Dr. Carles Collet specifically given concern for rare neurocognitive disorder

## 2021-10-04 NOTE — Assessment & Plan Note (Signed)
Treated with B12 injections in the past, will check vitamin B12 levels today to assess current status

## 2021-10-04 NOTE — Assessment & Plan Note (Signed)
Ongoing history with comorbid anxiety.  Has tried trazodone and mirtazapine without significant relief Will refer to psychiatry for further evaluation and management

## 2021-10-04 NOTE — Assessment & Plan Note (Signed)
History of anxiety with associated insomnia Given lack of improvement with previous trials of trazodone as well as mirtazapine, will refer to psychiatry for further evaluation and management

## 2021-10-04 NOTE — Assessment & Plan Note (Signed)
We will continue with current medications, review of chart indicates adequate control recently Will check labs as K[place], including hemoglobin A1c to assess current status

## 2021-10-04 NOTE — Progress Notes (Signed)
New Patient Office Visit  Subjective:  Patient ID: Brandon Vargas, male    DOB: [birth year]  Age: 65 y.o. MRN: [id number]  CC:  Chief Complaint  Patient presents with   Establish Care    HPI Brandon Vargas is a 65 year old male presenting to establish in clinic.  No specific concerns today.  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  Patient presents in the office alone, however has his brother on the phone to communicate some of the history.  Diabetes: Last A1c able to review in chart was July 2021 and found to be 6.5%.  Current medication includes glipizide.  Patient at one point was on pioglitazone as well as metformin, reports that he is not taking these at present.  Insomnia: Review of records from previous PCP at [place] indicates that patient has been managed with various options including trazodone, mirtazapine.  Feels that these have not provided notable relief recently.  Requesting alternative options.  Neurocognitive disorder: Has been evaluated by various providers including neurology, clinical neuropsychologist.  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  Patient's brother has been trying to get him in with specialist within [facility] health, Dr. Carles Collet, but has had some difficulty obtaining appointment.  Requesting assistance with this today.  Currently is being managed on donepezil.  Past Medical History:  Diagnosis Date   Anxiety    Arthritis    Depression    Diabetes mellitus without complication ([place])    GERD (gastroesophageal reflux disease)    Insomnia    Pre-diabetes    Wears glasses     Past Surgical History:  Procedure Laterality Date   CERVICAL LAMINOPLASTY  02/27/2020   COLONOSCOPY  2016   KNEE ARTHROSCOPY W/ MENISCAL REPAIR Left    RADIOLOGY WITH ANESTHESIA N/A 10/03/2019    Procedure: MRI WITH ANESTHESIA BRAIN AND CERVICAL SPINE WITHOUT CONTRAST;  Surgeon: Radiologist, Medication, MD;  Location: [place];  Service: Radiology;  Laterality: N/A;   ROTATOR CUFF REPAIR     VASECTOMY      Family History  Problem Relation Age of Onset   Lung cancer Mother    Colon cancer Father 34   Diabetes Father    Cancer - Colon Father    Colon polyps Father    Diabetes Sister    Diabetes Brother    Lung cancer Brother    Rectal cancer Neg Hx    Stomach cancer Neg Hx     Social History   Socioeconomic History   Marital status: Divorced    Spouse name: Not on file   Number of children: 2   Years of education: Not on file   Highest education level: Not on file  Occupational History   Occupation: [employer] - Part time  Tobacco Use   Smoking status: Never   Smokeless tobacco: Never  Vaping Use   Vaping Use: Never used  Substance and Sexual Activity   Alcohol use: Yes    Comment: 1-2 beer a few times per month   Drug use: Not Currently    Types: Marijuana   Sexual activity: Not Currently  Other Topics Concern   Not on file  Social History Narrative   Right handed   Lives in [place]   He is retired from working as a Licensed conveyancer.    Social Determinants of Health   Financial Resource Strain: Not on file  Food Insecurity: Not on file  Transportation Needs: Not on file  Physical Activity: Not on file  Stress: Not on file  Social Connections: Not on file  Intimate Partner Violence: Not on file    Objective:   Today's Vitals: BP 136/75   Pulse 69   Ht 6' 1.5" (1.867 m)   Wt 193 lb (87.5 kg)   SpO2 100%   BMI 25.12 kg/m   Physical Exam  65 year old male in no acute distress Cardiovascular exam with regular rate and rhythm, no murmurs appreciated Lungs clear to auscultation bilaterally  Assessment & Plan:   Problem List Items Addressed This Visit       Endocrine   Diabetes ([employer]) - Primary    We will  continue with current medications, review of chart indicates adequate control recently Will check labs as [place], including hemoglobin A1c to assess current status      Relevant Orders   Comprehensive metabolic panel (Completed)   Hemoglobin A1c (Completed)   Lipid panel (Completed)   TSH+T4F+T3Free (Completed)     Nervous and Auditory   Neurocognitive disorder    Patient has been trying to arrange appointment with specialist, has had difficulty doing so Will place referral to neurology, to have evaluation completed with Dr. Carles Collet specifically given concern for rare neurocognitive disorder      Relevant Orders   Ambulatory referral to Neurology     Other   Anxiety    History of anxiety with associated insomnia Given lack of improvement with previous trials of trazodone as well as mirtazapine, will refer to psychiatry for further evaluation and management      Relevant Medications   mirtazapine (REMERON) 7.5 MG tablet   Other Relevant Orders   Ambulatory referral to Psychiatry   Vitamin B12 deficiency    Treated with B12 injections in the past, will check vitamin B12 levels today to assess current status      Relevant Orders   Vitamin B12 (Completed)   Insomnia    Ongoing history with comorbid anxiety.  Has tried trazodone and mirtazapine without significant relief Will refer to psychiatry for further evaluation and management      Relevant Orders   CBC With Differential (Completed)   TSH+T4F+T3Free (Completed)   Ambulatory referral to Psychiatry   Other Visit Diagnoses     Depression, recurrent ([place])       Relevant Medications   mirtazapine (REMERON) 7.5 MG tablet   Other Relevant Orders   Ambulatory referral to Psychiatry       Outpatient Encounter Medications as of 09/29/2021  Medication Sig   Cyanocobalamin (B-12) 1000 MCG CAPS Take 1 capsule by mouth every other day.   donepezil (ARICEPT) 10 MG tablet Take 1 tablet by mouth daily.   mirtazapine (REMERON)  7.5 MG tablet Take 7.5 mg by mouth at bedtime.   [DISCONTINUED] glipiZIDE (GLUCOTROL) 10 MG tablet Take by mouth.   ACCU-CHEK GUIDE test strip    acetaminophen (TYLENOL) 325 MG tablet Take 650 mg by mouth every 6 (six) hours as needed.   calcium carbonate (TUMS - DOSED IN MG ELEMENTAL CALCIUM) 500 MG chewable tablet Chew 2 tablets by mouth daily as needed for indigestion or heartburn.   glipiZIDE (GLUCOTROL XL) 10 MG 24 hr tablet TAKE 1 TABLET EVERY DAY   pantoprazole (PROTONIX) 40 MG tablet Take by mouth.   sertraline (ZOLOFT) 50 MG tablet Take 50 mg by mouth 2 (two) times daily.    [DISCONTINUED] diphenhydramine-acetaminophen (TYLENOL PM) 25-500  MG TABS tablet Take 1 tablet by mouth at bedtime as needed.   [DISCONTINUED] Magnesium 400 MG CAPS Take 1 capsule by mouth daily.   [DISCONTINUED] metFORMIN (GLUCOPHAGE-XR) 500 MG 24 hr tablet Take 500 mg by mouth 2 (two) times daily.    [DISCONTINUED] pioglitazone (ACTOS) 15 MG tablet Take 15 mg by mouth daily.   [DISCONTINUED] SUTAB [id number] MG TABS Take 12 tablets by mouth as directed. Evening before colonoscopy then 6 hours before colonoscopy.   No facility-administered encounter medications on file as of 09/29/2021.   Spent 65 minutes on this patient encounter, including preparation, chart review, face-to-face counseling with patient and coordination of care, and documentation of encounter  Follow-up: Return in about 2 months (around 11/29/2021).  Plan for follow-up in about 2 months to monitor status, review referrals.  Jacari Kirsten J [place], MD

## 2021-10-07 ENCOUNTER — Encounter: Payer: Medicare HMO | Attending: Psychology | Admitting: Psychology

## 2021-10-07 ENCOUNTER — Other Ambulatory Visit: Payer: Self-pay

## 2021-10-07 ENCOUNTER — Encounter: Payer: Self-pay | Admitting: Psychology

## 2021-10-07 DIAGNOSIS — F0390 Unspecified dementia without behavioral disturbance: Secondary | ICD-10-CM | POA: Diagnosis not present

## 2021-10-07 DIAGNOSIS — F09 Unspecified mental disorder due to known physiological condition: Secondary | ICD-10-CM | POA: Diagnosis not present

## 2021-10-07 DIAGNOSIS — R269 Unspecified abnormalities of gait and mobility: Secondary | ICD-10-CM | POA: Diagnosis present

## 2021-10-07 DIAGNOSIS — R2689 Other abnormalities of gait and mobility: Secondary | ICD-10-CM

## 2021-10-07 DIAGNOSIS — F419 Anxiety disorder, unspecified: Secondary | ICD-10-CM

## 2021-10-07 NOTE — Progress Notes (Signed)
10/07/2021 2 PM-4 PM:  Today's visit was an in person visit that was conducted in my outpatient clinic office.  This is a follow-up visit from a previous neuropsychological evaluation conducted by Joelyn Oms, Psy.D. as part of a larger neurological work-up by William Dalton, MD with [facility] neurology back in 2021.  The patient had had progressive changes in cognitive functioning, changes in eye tracking and eye movement with mild symmetric parkinsonian with anterocollis bradykinesia, and rigidity noted.  Differential diagnoses such as Parkinson's, Lewy body, and frontotemporal dementia were part of the differential diagnoses.  The patient had been followed by [facility] neurology but because of the skilled nursing facility where he is now staying would not provide transportation to [facility] care was transferred over to [place].  After Dr. Jacelyn Grip evaluation and recommendation was made to follow-up with movement special neurologist at [facility] neurology Wells Guiles Tat, DO).  However, while my understanding of specific details may not be completely accurate but issue arose with the patient previously having seen a neurologist in that practice due to cervical spine issues and the logical attempt to have him return to see that initial neurologist.  The patient was confused by this after being told that he would see the movement specialist and an appointment was never made.  It is now been more than a year since he has been followed by any type of neurology.  Recommendations by Dr. Darol Destine were that further imaging be conducted such as pet scan and SPECT scans (DAT) to more confidently provide diagnostic clarity.  The neuropsychological evaluation provides in-depth analysis of cognitive functioning and can be found in the patient's EMR dated 01/21/2021.  Today, the patient was present in person and his brother who lives in another state was available through speaker phone.  Both the patient and his  brother reports that there has not been any apparent significant change in cognitive functioning although that continue to be the same cognitive deficits identified previously and in detail described in Dr. Jacelyn Grip note dated 01/21/2021.  The brother notes further slowing of eye movement and eye coordination, further slowing and speech production and fluency and further changes and worsening in gait are noted.  The patient has been having regular physical therapies in the rehab facility and both he and the patient feel like his balance is better with these therapies.  Both the patient and his brother deny any changes in personality styles or personality patterns.  The patient clearly had significant tremor today with most tremor noted in his right hand.  There was significant rigidity and gait and motor movement and difficulties with eye movement itself.  The patient's speech had low volume and was slow and halting at times.  At this point, I do think that there is not any need for repeat neuropsychological testing but we do need to get him seen by neurology again.  He is no longer being followed by [facility].  The patient's brother will reach out to Dr. Serita Grit office on Monday to establish a follow-up appointment and to see what Dr. Serita Grit thoughts are regarding referral to the movement specialist in her office.  If there is any difficulties with reestablishing care or they need a new formal referral the patient's brother is to contact our office and I will send out a new referral.    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

## 2021-10-08 ENCOUNTER — Emergency Department (HOSPITAL_BASED_OUTPATIENT_CLINIC_OR_DEPARTMENT_OTHER)
Admission: EM | Admit: 2021-10-08 | Discharge: 2021-10-08 | Disposition: A | Discharge status: Home / Self Care | Payer: Medicare HMO | Attending: Emergency Medicine | Admitting: Emergency Medicine

## 2021-10-08 ENCOUNTER — Emergency Department (HOSPITAL_BASED_OUTPATIENT_CLINIC_OR_DEPARTMENT_OTHER): Payer: Medicare HMO

## 2021-10-08 ENCOUNTER — Encounter (HOSPITAL_BASED_OUTPATIENT_CLINIC_OR_DEPARTMENT_OTHER): Payer: Self-pay

## 2021-10-08 ENCOUNTER — Other Ambulatory Visit: Payer: Self-pay

## 2021-10-08 DIAGNOSIS — Z7984 Long term (current) use of oral hypoglycemic drugs: Secondary | ICD-10-CM | POA: Diagnosis not present

## 2021-10-08 DIAGNOSIS — Y9389 Activity, other specified: Secondary | ICD-10-CM | POA: Diagnosis not present

## 2021-10-08 DIAGNOSIS — M50322 Other cervical disc degeneration at C5-C6 level: Secondary | ICD-10-CM | POA: Insufficient documentation

## 2021-10-08 DIAGNOSIS — Z23 Encounter for immunization: Secondary | ICD-10-CM | POA: Insufficient documentation

## 2021-10-08 DIAGNOSIS — M5031 Other cervical disc degeneration,  high cervical region: Secondary | ICD-10-CM | POA: Diagnosis not present

## 2021-10-08 DIAGNOSIS — E119 Type 2 diabetes mellitus without complications: Secondary | ICD-10-CM | POA: Diagnosis not present

## 2021-10-08 DIAGNOSIS — M50321 Other cervical disc degeneration at C4-C5 level: Secondary | ICD-10-CM | POA: Insufficient documentation

## 2021-10-08 DIAGNOSIS — S0990XA Unspecified injury of head, initial encounter: Secondary | ICD-10-CM | POA: Diagnosis present

## 2021-10-08 DIAGNOSIS — M5033 Other cervical disc degeneration, cervicothoracic region: Secondary | ICD-10-CM | POA: Insufficient documentation

## 2021-10-08 DIAGNOSIS — W01198A Fall on same level from slipping, tripping and stumbling with subsequent striking against other object, initial encounter: Secondary | ICD-10-CM | POA: Diagnosis not present

## 2021-10-08 DIAGNOSIS — M50323 Other cervical disc degeneration at C6-C7 level: Secondary | ICD-10-CM | POA: Diagnosis not present

## 2021-10-08 DIAGNOSIS — W19XXXA Unspecified fall, initial encounter: Secondary | ICD-10-CM

## 2021-10-08 DIAGNOSIS — S0101XA Laceration without foreign body of scalp, initial encounter: Secondary | ICD-10-CM | POA: Insufficient documentation

## 2021-10-08 MED ORDER — LIDOCAINE HCL 2 % IJ SOLN
20.0000 mL | Freq: Once | INTRAMUSCULAR | Status: AC
  Administered 2021-10-08: 400 mg via INTRADERMAL
  Filled 2021-10-08: qty 20

## 2021-10-08 MED ORDER — TETANUS-DIPHTH-ACELL PERTUSSIS 5-2.5-18.5 LF-MCG/0.5 IM SUSY
0.5000 mL | PREFILLED_SYRINGE | Freq: Once | INTRAMUSCULAR | Status: AC
  Administered 2021-10-08: 0.5 mL via INTRAMUSCULAR
  Filled 2021-10-08: qty 0.5

## 2021-10-08 NOTE — ED Provider Notes (Signed)
[place] EMERGENCY DEPT Provider Note   CSN: [id number] Arrival date & time: 10/08/21  1258     History Chief Complaint  Patient presents with   Brandon Vargas is a 65 y.o. male.  HPI  65 year old male with history of anxiety, arthritis, depression, diabetes, GERD, insomnia, who presents to the emergency department today for evaluation of a fall.  He states that this morning he was putting his socks on and he lost his footing falling and hitting his head on his walker.  He denies loss of consciousness.  He he denies any other injuries.  He does have some chronic neck pain.  Past Medical History:  Diagnosis Date   Anxiety    Arthritis    Depression    Diabetes mellitus without complication ([place])    GERD (gastroesophageal reflux disease)    Insomnia    Pre-diabetes    Wears glasses     Patient Active Problem List   Diagnosis Date Noted   Diabetes ([place]) 09/29/2021   Vitamin B12 deficiency 09/29/2021   Neurocognitive disorder 09/29/2021   Insomnia 09/29/2021   Abnormality of gait 11/02/2018   Balance problem 11/02/2018   Elevated PSA 04/09/2018   Dermatophytosis of groin 12/24/2017   Hyperglycemia 05/29/2017   IFG (impaired fasting glucose) 05/29/2017   Anxiety 04/17/2017   Benign prostatic hyperplasia without lower urinary tract symptoms 04/17/2017    Past Surgical History:  Procedure Laterality Date   CERVICAL LAMINOPLASTY  02/27/2020   COLONOSCOPY  2016   KNEE ARTHROSCOPY W/ MENISCAL REPAIR Left    RADIOLOGY WITH ANESTHESIA N/A 10/03/2019   Procedure: MRI WITH ANESTHESIA BRAIN AND CERVICAL SPINE WITHOUT CONTRAST;  Surgeon: Radiologist, Medication, MD;  Location: [place];  Service: Radiology;  Laterality: N/A;   ROTATOR CUFF REPAIR     VASECTOMY         Family History  Problem Relation Age of Onset   Lung cancer Mother    Colon cancer Father 47   Diabetes Father    Cancer - Colon Father    Colon polyps Father    Diabetes Sister     Diabetes Brother    Lung cancer Brother    Rectal cancer Neg Hx    Stomach cancer Neg Hx     Social History   Tobacco Use   Smoking status: Never   Smokeless tobacco: Never  Vaping Use   Vaping Use: Never used  Substance Use Topics   Alcohol use: Yes    Comment: 1-2 beer a few times per month   Drug use: Not Currently    Types: Marijuana    Home Medications Prior to Admission medications   Medication Sig Start Date End Date Taking? Authorizing Provider  ACCU-CHEK GUIDE test strip  05/15/20   [provider]  acetaminophen (TYLENOL) 325 MG tablet Take 650 mg by mouth every 6 (six) hours as needed.    [provider]  calcium carbonate (TUMS - DOSED IN MG ELEMENTAL CALCIUM) 500 MG chewable tablet Chew 2 tablets by mouth daily as needed for indigestion or heartburn.    [provider]  Cyanocobalamin (B-12) 1000 MCG CAPS Take 1 capsule by mouth every other day. 06/04/21   [provider]  donepezil (ARICEPT) 10 MG tablet Take 1 tablet by mouth daily. 12/02/20   [provider]  glipiZIDE (GLUCOTROL XL) 10 MG 24 hr tablet TAKE 1 TABLET EVERY DAY 05/13/20   [provider]  mirtazapine (REMERON) 7.5  MG tablet Take 7.5 mg by mouth at bedtime. 09/02/21   [provider]  pantoprazole (PROTONIX) 40 MG tablet Take by mouth. 12/16/19   [provider]  sertraline (ZOLOFT) 50 MG tablet Take 50 mg by mouth 2 (two) times daily.     [provider]    Allergies    Patient has no known allergies.  Review of Systems   Review of Systems  Constitutional:  Negative for fever.  Musculoskeletal:  Positive for neck pain.  Skin:  Positive for wound.  Neurological:  Negative for weakness and numbness.       Head trauma, no loc   Physical Exam Updated Vital Signs BP (!) 187/92 (BP Location: Left Arm)   Pulse 75   Temp 98.1 F (36.7 C) (Oral)   Resp (!) 22   Ht 6' 1.5" (1.867 m)   Wt 87.5 kg   SpO2 100%   BMI  25.12 kg/m   Physical Exam Constitutional:      General: He is not in acute distress.    Appearance: He is well-developed.  HENT:     Head:     Comments: 2 cm laceration to the left scalp Eyes:     Conjunctiva/sclera: Conjunctivae normal.  Cardiovascular:     Rate and Rhythm: Normal rate.  Pulmonary:     Effort: Pulmonary effort is normal.  Abdominal:     Palpations: Abdomen is soft.     Tenderness: There is no abdominal tenderness.  Skin:    General: Skin is warm and dry.  Neurological:     Mental Status: He is alert and oriented to person, place, and time.     Comments: Mental Status:  Alert, thought content appropriate, able to give a coherent history. Speech fluent without evidence of aphasia. Able to follow 2 step commands without difficulty.  Cranial Nerves:  II:  Peripheral visual fields grossly normal, pupils equal, round, reactive to light III,IV, VI: ptosis not present, extra-ocular motions intact bilaterally  V,VII: smile symmetric, facial light touch sensation equal VIII: hearing grossly normal to voice  X: uvula elevates symmetrically  XI: bilateral shoulder shrug symmetric and strong XII: midline tongue extension without fassiculations Motor:  Normal tone. 5/5 strength of BUE and BLE major muscle groups including strong and equal grip strength and dorsiflexion/plantar flexion Sensory: light touch normal in all extremities.     ED Results / Procedures / Treatments   Labs (all labs ordered are listed, but only abnormal results are displayed) Labs Reviewed - No data to display  EKG None  Radiology CT Head Wo Contrast  Result Date: 10/08/2021 CLINICAL DATA:  Status post fall. EXAM: CT HEAD WITHOUT CONTRAST TECHNIQUE: Contiguous axial images were obtained from the base of the skull through the vertex without intravenous contrast. COMPARISON:  October 29, 2018 FINDINGS: Brain: There is mild cerebral atrophy with widening of the extra-axial spaces and  ventricular dilatation. There are areas of decreased attenuation within the white matter tracts of the supratentorial brain, consistent with microvascular disease changes. Vascular: No hyperdense vessel or unexpected calcification. Skull: Normal. Negative for fracture or focal lesion. Sinuses/Orbits: No acute finding. Other: None. IMPRESSION: 1. Generalized cerebral atrophy and microvascular disease changes of the supratentorial brain. 2. No acute intracranial abnormality. Electronically Signed   By: Virgina Norfolk M.D.   On: 10/08/2021 15:17   CT Cervical Spine Wo Contrast  Result Date: 10/08/2021 CLINICAL DATA:  Status post fall. EXAM: CT CERVICAL SPINE WITHOUT CONTRAST TECHNIQUE: Multidetector CT  imaging of the cervical spine was performed without intravenous contrast. Multiplanar CT image reconstructions were also generated. COMPARISON:  None. FINDINGS: Alignment: Normal. Skull base and vertebrae: No acute fracture. No primary bone lesion or focal pathologic process. Soft tissues and spinal canal: No prevertebral fluid or swelling. No visible canal hematoma. Disc levels: Mild to moderate severity endplate sclerosis and marked severity osteophyte formation are seen at the levels of C3-C4, C4-C5, C5-C6, C6-C7 and C7-T1. Mild intervertebral disc space narrowing is noted at the levels of C3-C4, C4-C5 and C5-C6. Mild-to-moderate severity multilevel intervertebral disc space narrowing is noted at the levels of C6-C7 and C7-T1. Bilateral multilevel marked severity facet joint hypertrophy is noted. Evidence of prior multilevel cervical laminoplasty is seen. Upper chest: Negative. Other: None. IMPRESSION: 1. No acute osseous abnormality of the cervical spine. 2. Evidence of prior multilevel cervical laminoplasty. 3. Moderate to marked severity multilevel degenerative changes, as described above. Electronically Signed   By: Virgina Norfolk M.D.   On: 10/08/2021 15:23    Procedures .Marland KitchenLaceration  Repair  Date/Time: 10/08/2021 4:35 PM Performed by: Rodney Booze, PA-C Authorized by: Rodney Booze, PA-C   Consent:    Consent obtained:  Verbal   Consent given by:  Patient   Risks, benefits, and alternatives were discussed: yes     Risks discussed:  Infection   Alternatives discussed:  No treatment Universal protocol:    Procedure explained and questions answered to patient or proxy's satisfaction: yes     Immediately prior to procedure, a time out was called: yes     Patient identity confirmed:  Verbally with patient Anesthesia:    Anesthesia method:  Local infiltration   Local anesthetic:  Lidocaine 2% w/o epi Laceration details:    Location:  Scalp   Length (cm):  2 Pre-procedure details:    Preparation:  Patient was prepped and draped in usual sterile fashion and imaging obtained to evaluate for foreign bodies Exploration:    Hemostasis achieved with:  Direct pressure   Wound exploration: wound explored through full range of motion and entire depth of wound visualized     Contaminated: no   Treatment:    Area cleansed with:  Chlorhexidine   Amount of cleaning:  Standard   Debridement:  None   Undermining:  None   Scar revision: no   Skin repair:    Repair method:  Staples   Number of staples:  3 Approximation:    Approximation:  Close Repair type:    Repair type:  Simple Post-procedure details:    Dressing:  Open (no dressing)   Procedure completion:  Tolerated   Medications Ordered in ED Medications  Tdap (BOOSTRIX) injection 0.5 mL (0.5 mLs Intramuscular Given 10/08/21 1351)  lidocaine (XYLOCAINE) 2 % (with pres) injection 400 mg (400 mg Intradermal Given by Other 10/08/21 1351)    ED Course  I have reviewed the triage vital signs and the nursing notes.  Pertinent labs & imaging results that were available during my care of the patient were reviewed by me and considered in my medical decision making (see chart for details).    MDM  Rules/Calculators/A&P                          64 year old male here with mechanical fall.  Hit his head but did not lose consciousness.  Does have a laceration to the left scalp noted with minimal bleeding.  This was repaired with staples.  Neuro exam is within normal limits however given age feel that CT scan of the head and neck is warranted at this time which are both reassuring.  Patient advised to return for suture removal in 5 days.  Advised to follow-up and return if worse.  He voices understanding the plan and reasons to return.  Questions answered.  Patient stable for discharge.   Final Clinical Impression(s) / ED Diagnoses Final diagnoses:  Fall, initial encounter  Injury of head, initial encounter    Rx / DC Orders ED Discharge Orders     None        Bishop Dublin 10/08/21 1636    Lacretia Leigh, MD 10/09/21 2252

## 2021-10-08 NOTE — Discharge Instructions (Signed)
You will need to return in 5 days to have the staples removed from your head.  Follow-up with your regular doctor in the next week for reassessment and return to the ED for new or worsening symptoms.

## 2021-10-08 NOTE — ED Notes (Signed)
Bp cuff replaced with smaller cuffed and BP rechecked

## 2021-10-08 NOTE — ED Triage Notes (Signed)
BIB EMS from [facility]. Pt reports he was putting on his socks and tripped and fell. Pt did hit his head and has a small to lac to top of head. Bleeding is controlled. Denies any other injuries. Denies taking blood thinners and LOC. A&O x4. Reports chronic neck pain from surgery.

## 2021-10-11 ENCOUNTER — Encounter (HOSPITAL_BASED_OUTPATIENT_CLINIC_OR_DEPARTMENT_OTHER): Payer: Self-pay | Admitting: Family Medicine

## 2021-10-14 ENCOUNTER — Other Ambulatory Visit: Payer: Self-pay

## 2021-10-14 ENCOUNTER — Emergency Department (HOSPITAL_BASED_OUTPATIENT_CLINIC_OR_DEPARTMENT_OTHER)
Admission: EM | Admit: 2021-10-14 | Discharge: 2021-10-14 | Disposition: A | Discharge status: Home / Self Care | Payer: Medicare HMO | Attending: Emergency Medicine | Admitting: Emergency Medicine

## 2021-10-14 ENCOUNTER — Encounter (HOSPITAL_BASED_OUTPATIENT_CLINIC_OR_DEPARTMENT_OTHER): Payer: Self-pay | Admitting: Emergency Medicine

## 2021-10-14 DIAGNOSIS — X58XXXD Exposure to other specified factors, subsequent encounter: Secondary | ICD-10-CM | POA: Insufficient documentation

## 2021-10-14 DIAGNOSIS — Z4802 Encounter for removal of sutures: Secondary | ICD-10-CM | POA: Diagnosis not present

## 2021-10-14 DIAGNOSIS — S0101XD Laceration without foreign body of scalp, subsequent encounter: Secondary | ICD-10-CM | POA: Insufficient documentation

## 2021-10-14 DIAGNOSIS — E119 Type 2 diabetes mellitus without complications: Secondary | ICD-10-CM | POA: Diagnosis not present

## 2021-10-14 DIAGNOSIS — Z7984 Long term (current) use of oral hypoglycemic drugs: Secondary | ICD-10-CM | POA: Diagnosis not present

## 2021-10-14 NOTE — ED Triage Notes (Signed)
Pt had 3 staples to close an incision last Friday (6 days ago) and was advised to return for staple removal. No signs of infection noted in triage, wound edges approximated and scabbed.

## 2021-10-14 NOTE — ED Provider Notes (Signed)
[facility] EMERGENCY DEPT Provider Note   CSN: [id number] Arrival date & time: 10/14/21  1126     History Chief Complaint  Patient presents with   Suture / Staple Removal    3 staples to left upper head.    Brandon Vargas is a 65 y.o. male who presents to the emergency department today for staple removal.  Patient had 3 staples to close a head laceration 6 days ago, and was advised to return for staple removal.  There is no evidence of infection, wound edges are well approximated and scabbed.   Suture / Staple Removal      Past Medical History:  Diagnosis Date   Anxiety    Arthritis    Depression    Diabetes mellitus without complication ([place])    GERD (gastroesophageal reflux disease)    Insomnia    Pre-diabetes    Wears glasses     Patient Active Problem List   Diagnosis Date Noted   Diabetes ([place]) 09/29/2021   Vitamin B12 deficiency 09/29/2021   Neurocognitive disorder 09/29/2021   Insomnia 09/29/2021   Abnormality of gait 11/02/2018   Balance problem 11/02/2018   Elevated PSA 04/09/2018   Dermatophytosis of groin 12/24/2017   Hyperglycemia 05/29/2017   IFG (impaired fasting glucose) 05/29/2017   Anxiety 04/17/2017   Benign prostatic hyperplasia without lower urinary tract symptoms 04/17/2017    Past Surgical History:  Procedure Laterality Date   CERVICAL LAMINOPLASTY  02/27/2020   COLONOSCOPY  2016   KNEE ARTHROSCOPY W/ MENISCAL REPAIR Left    RADIOLOGY WITH ANESTHESIA N/A 10/03/2019   Procedure: MRI WITH ANESTHESIA BRAIN AND CERVICAL SPINE WITHOUT CONTRAST;  Surgeon: Radiologist, Medication, MD;  Location: [facility];  Service: Radiology;  Laterality: N/A;   ROTATOR CUFF REPAIR     VASECTOMY         Family History  Problem Relation Age of Onset   Lung cancer Mother    Colon cancer Father 61   Diabetes Father    Cancer - Colon Father    Colon polyps Father    Diabetes Sister    Diabetes Brother    Lung cancer Brother    Rectal  cancer Neg Hx    Stomach cancer Neg Hx     Social History   Tobacco Use   Smoking status: Never   Smokeless tobacco: Never  Vaping Use   Vaping Use: Never used  Substance Use Topics   Alcohol use: Yes    Comment: 1-2 beer a few times per month   Drug use: Not Currently    Types: Marijuana    Home Medications Prior to Admission medications   Medication Sig Start Date End Date Taking? Authorizing Provider  ACCU-CHEK GUIDE test strip  05/15/20   [provider]  acetaminophen (TYLENOL) 325 MG tablet Take 650 mg by mouth every 6 (six) hours as needed.    [provider]  calcium carbonate (TUMS - DOSED IN MG ELEMENTAL CALCIUM) 500 MG chewable tablet Chew 2 tablets by mouth daily as needed for indigestion or heartburn.    [provider]  Cyanocobalamin (B-12) 1000 MCG CAPS Take 1 capsule by mouth every other day. 06/04/21   [provider]  donepezil (ARICEPT) 10 MG tablet Take 1 tablet by mouth daily. 12/02/20   [provider]  glipiZIDE (GLUCOTROL XL) 10 MG 24 hr tablet TAKE 1 TABLET EVERY DAY 05/13/20   [provider]  mirtazapine (REMERON) 7.5 MG tablet Take 7.5 mg  by mouth at bedtime. 09/02/21   [provider]  pantoprazole (PROTONIX) 40 MG tablet Take by mouth. 12/16/19   [provider]  sertraline (ZOLOFT) 50 MG tablet Take 50 mg by mouth 2 (two) times daily.     [provider]    Allergies    Patient has no known allergies.  Review of Systems   Review of Systems  Skin:        Scalp laceration  All other systems reviewed and are negative.  Physical Exam Updated Vital Signs BP (!) 145/87 (BP Location: Left Arm)   Pulse 66   Temp 97.6 F (36.4 C) (Oral)   Resp 14   Ht 6\' 1"  (1.854 m)   Wt 87.5 kg   SpO2 99%   BMI 25.46 kg/m   Physical Exam Vitals and nursing note reviewed.  Constitutional:      Appearance: Normal appearance.  HENT:     Head: Normocephalic and atraumatic.  Eyes:      Conjunctiva/sclera: Conjunctivae normal.  Pulmonary:     Effort: Pulmonary effort is normal. No respiratory distress.  Skin:    General: Skin is warm and dry.     Comments: Well-healed scalp laceration with 3 staples in place.  No overlying erythema, tenderness, drainage.  Neurological:     Mental Status: He is alert.  Psychiatric:        Mood and Affect: Mood normal.        Behavior: Behavior normal.    ED Results / Procedures / Treatments   Labs (all labs ordered are listed, but only abnormal results are displayed) Labs Reviewed - No data to display  EKG None  Radiology No results found.  Procedures .Suture Removal  Date/Time: 10/14/2021 12:51 PM Performed by: Kateri Plummer, PA-C Authorized by: Kateri Plummer, PA-C   Consent:    Consent obtained:  Verbal   Consent given by:  Patient   Risks, benefits, and alternatives were discussed: yes     Risks discussed:  Pain Universal protocol:    Procedure explained and questions answered to patient or proxy's satisfaction: yes     Patient identity confirmed:  Verbally with patient Location:    Location:  Head/neck   Head/neck location:  Scalp Procedure details:    Wound appearance:  No signs of infection   Number of staples removed:  3 Post-procedure details:    Post-removal:  No dressing applied   Procedure completion:  Tolerated   Medications Ordered in ED Medications - No data to display  ED Course  I have reviewed the triage vital signs and the nursing notes.  Pertinent labs & imaging results that were available during my care of the patient were reviewed by me and considered in my medical decision making (see chart for details).    MDM Rules/Calculators/A&P                           Patient is 65 year old male who presents for suture removal.  Patient had 3 staples placed to close an incision on his scalp 6 days ago, and was advised to return for staple removal.  On exam there an approximately  1 cm partially healed laceration with no evidence of infection.  There is good approximation of wound edges.  There is no overlying erythema, tenderness, drainage from the site.  Removed 3 staples without difficulty.  Patient tolerated procedure well.  Discussed evaluating for any further signs of  infection.  Patient not requiring further evaluation, admission or inpatient treatment for symptoms at this time.  Plan to discharge home.  Patient agreeable to plan.  Final Clinical Impression(s) / ED Diagnoses Final diagnoses:  Encounter for staple removal    Rx / DC Orders ED Discharge Orders     None        Kateri Plummer, PA-C 10/14/21 1255    Charlesetta Shanks, MD 10/14/21 1607

## 2021-10-14 NOTE — Discharge Instructions (Addendum)
You were seen in the emergency department today for staple removal.   We removed your 3 staples and I see no evidence of infection. I've attached instructions for how to care for your laceration after staple removal.  Continue to monitor how you're doing and return to the ER for new or worsening symptoms such as increased redness, tenderness, or drainage from the area.   It has been a pleasure seeing and caring for you today and I hope you start feeling better soon!
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
## 2021-10-25 ENCOUNTER — Other Ambulatory Visit (HOSPITAL_BASED_OUTPATIENT_CLINIC_OR_DEPARTMENT_OTHER): Payer: Self-pay

## 2021-10-25 MED ORDER — PANTOPRAZOLE SODIUM 40 MG PO TBEC: 40.0000 mg | DELAYED_RELEASE_TABLET | Freq: Every day | ORAL | 0 refills | Status: DC

## 2021-10-26 ENCOUNTER — Ambulatory Visit (INDEPENDENT_AMBULATORY_CARE_PROVIDER_SITE_OTHER): Payer: Medicare HMO | Admitting: Neurology

## 2021-10-26 ENCOUNTER — Other Ambulatory Visit: Payer: Self-pay

## 2021-10-26 ENCOUNTER — Encounter: Payer: Self-pay | Admitting: Neurology

## 2021-10-26 VITALS — BP 146/79 | HR 80 | Ht 73.0 in | Wt 197.0 lb

## 2021-10-26 DIAGNOSIS — R1310 Dysphagia, unspecified: Secondary | ICD-10-CM | POA: Diagnosis not present

## 2021-10-26 DIAGNOSIS — R419 Unspecified symptoms and signs involving cognitive functions and awareness: Secondary | ICD-10-CM

## 2021-10-26 DIAGNOSIS — G20 Parkinson's disease: Secondary | ICD-10-CM

## 2021-10-26 DIAGNOSIS — G231 Progressive supranuclear ophthalmoplegia [Steele-Richardson-Olszewski]: Secondary | ICD-10-CM | POA: Diagnosis not present

## 2021-10-26 MED ORDER — CARBIDOPA-LEVODOPA 25-100 MG PO TABS: 1.0000 | ORAL_TABLET | Freq: Three times a day (TID) | ORAL | 2 refills | Status: DC

## 2021-10-26 NOTE — Patient Instructions (Addendum)
Start Carbidopa Levodopa as follows: Take 1/2 tablet three times daily, at least 30 minutes before meals (approximately 7am/11am/4pm), for one week Then take 1/2 tablet in the morning, 1/2 tablet in the afternoon, 1 tablet in the evening, at least 30 minutes before meals, for one week Then take 1/2 tablet in the morning, 1 tablet in the afternoon, 1 tablet in the evening, at least 30 minutes before meals, for one week Then take 1 tablet three times daily at 7am/11am/4pm, at least 30 minutes before meals   As a reminder, carbidopa/levodopa can be taken at the same time as a carbohydrate, but we like to have you take your pill either 30 minutes before a protein source or 1 hour after as protein can interfere with carbidopa/levodopa absorption.  We will order a swallow study.  We discussed that you have eyelid opening apraxia, which affects the ability to open the eyes.  We discussed botox.  You can let us know if you are interested in that.

## 2021-10-29 ENCOUNTER — Other Ambulatory Visit (HOSPITAL_COMMUNITY): Payer: Self-pay

## 2021-10-29 DIAGNOSIS — R1310 Dysphagia, unspecified: Secondary | ICD-10-CM

## 2021-11-09 ENCOUNTER — Encounter (HOSPITAL_COMMUNITY): Payer: Medicare HMO

## 2021-11-09 ENCOUNTER — Ambulatory Visit (HOSPITAL_COMMUNITY): Payer: Medicare HMO

## 2021-11-09 ENCOUNTER — Ambulatory Visit: Payer: Medicare HMO | Admitting: Psychology

## 2021-11-11 ENCOUNTER — Ambulatory Visit (HOSPITAL_COMMUNITY)
Admission: RE | Admit: 2021-11-11 | Discharge: 2021-11-11 | Disposition: A | Discharge status: Home / Self Care | Payer: Medicare HMO | Source: Ambulatory Visit | Attending: Neurology | Admitting: Neurology

## 2021-11-11 ENCOUNTER — Other Ambulatory Visit: Payer: Self-pay

## 2021-11-11 DIAGNOSIS — R1310 Dysphagia, unspecified: Secondary | ICD-10-CM | POA: Diagnosis present

## 2021-11-11 DIAGNOSIS — R1312 Dysphagia, oropharyngeal phase: Secondary | ICD-10-CM | POA: Diagnosis present

## 2021-11-11 NOTE — Therapy (Signed)
Modified Barium Swallow Progress Note  Patient Details  Name: Brandon Vargas MRN: [id number] Date of Birth: November 07, 1956  Today's Date: 11/11/2021  Modified Barium Swallow completed.  Full report located under Chart Review in the Imaging Section.  Brief recommendations include the following:  Clinical Impression  Pt presents with normal oropharyngeal swallow. Timely oral prep and propulsion without residue or anterior leakage was seen. Pharyngeal swallow was timely, without post-swallow residue or penetration/aspiration of any texture. Esophageal sweep revealed it to be clear. Recommend continuing with regular diet and thin liquids, meds whole with liquid. Pt mentioned he is currently receiving speech therapy. Will defer to primary SLP for further plan of care, as pt is at risk for deterioration of swallow function with progression of Parkinson's.    Swallow Evaluation Recommendations  SLP Diet Recommendations: Regular solids;Thin liquid   Liquid Administration via: Cup;Straw   Medication Administration: Whole meds with liquid   Supervision: Patient able to self feed   Compensations: Slow rate;Small sips/bites;Minimize environmental distractions   Postural Changes: Seated upright at 90 degrees   Oral Care Recommendations: Oral care BID      Brandon Vargas B. Brandon Vargas, Brandon Vargas, Brandon Vargas Speech Language Pathologist Office: [phone]  Brandon Vargas 11/11/2021,2:14 PM

## 2021-11-14 ENCOUNTER — Encounter (HOSPITAL_BASED_OUTPATIENT_CLINIC_OR_DEPARTMENT_OTHER): Payer: Self-pay | Admitting: Family Medicine

## 2021-11-29 ENCOUNTER — Ambulatory Visit (HOSPITAL_BASED_OUTPATIENT_CLINIC_OR_DEPARTMENT_OTHER): Payer: Medicare HMO | Admitting: Family Medicine

## 2021-11-30 ENCOUNTER — Other Ambulatory Visit: Payer: Self-pay

## 2021-11-30 ENCOUNTER — Encounter (HOSPITAL_BASED_OUTPATIENT_CLINIC_OR_DEPARTMENT_OTHER): Payer: Self-pay | Admitting: Family Medicine

## 2021-11-30 ENCOUNTER — Ambulatory Visit (INDEPENDENT_AMBULATORY_CARE_PROVIDER_SITE_OTHER): Payer: Medicare HMO | Admitting: Family Medicine

## 2021-11-30 VITALS — BP 120/66 | HR 76 | Ht 73.0 in | Wt 199.0 lb

## 2021-11-30 DIAGNOSIS — R419 Unspecified symptoms and signs involving cognitive functions and awareness: Secondary | ICD-10-CM | POA: Diagnosis not present

## 2021-11-30 DIAGNOSIS — R2689 Other abnormalities of gait and mobility: Secondary | ICD-10-CM | POA: Diagnosis not present

## 2021-11-30 DIAGNOSIS — E119 Type 2 diabetes mellitus without complications: Secondary | ICD-10-CM

## 2021-11-30 DIAGNOSIS — G4700 Insomnia, unspecified: Secondary | ICD-10-CM | POA: Diagnosis not present

## 2021-11-30 NOTE — Assessment & Plan Note (Signed)
Related to underlying neurocognitive disorder, patient does have occasional falls Did have 1 recently that resulted in laceration to scalp.  On exam, area is well-healed with small scar remaining.  No continued tenderness, no surrounding erythema. Patient will continue to work with therapy at [facility].  Continue with use of walker.

## 2021-11-30 NOTE — Assessment & Plan Note (Signed)
Continues to have issues with this, has established with psychiatry related to this as well as underlying depression Has had some medication changes including addition of citalopram as well as gabapentin Has questions today regarding continuing mirtazapine, advised that he discuss further with psychiatrist Also has orders today for vitamin D level from psychiatrist, labs will be completed here today Recommend continued close follow-up with psychiatry regarding medication management

## 2021-11-30 NOTE — Patient Instructions (Addendum)
  Medication Instructions:  Your physician recommends that you continue on your current medications as directed. Please refer to the Current Medication list given to you today.  [place] regarding Carbidpoa-Levodopa refill--  --If you need a refill on any your medications before your next appointment, please call your pharmacy first. If no refills are authorized on file call the office.-- Lab Work: Your physician has recommended that you have lab work today: vit d If you have labs (blood work) drawn today and your tests are completely normal, you will receive your results via [place] a phone call from our staff.  Please ensure you check your voicemail in the event that you authorized detailed messages to be left on a delegated number. If you have any lab test that is abnormal or we need to change your treatment, we will call you to review the results.  Follow-Up: Your next appointment:   Your physician recommends that you schedule a follow-up appointment in: 2 months with Dr. de [place]  You will receive a text message or e-mail with a link to a survey about your care and experience with [place] today! We would greatly appreciate your feedback!   Thanks for letting us be apart of your health journey!!  Primary Care and Sports Medicine   Dr. Arlina Robes [place]   We encourage you to activate your patient portal called "MyChart".  Sign up information is provided on this After Visit Summary.  MyChart is used to connect with patients for Virtual Visits (Telemedicine).  Patients are able to view lab/test results, encounter notes, upcoming appointments, etc.  Non-urgent messages can be sent to your provider as well. To learn more about what you can do with MyChart, please visit --  [email or web address].

## 2021-11-30 NOTE — Assessment & Plan Note (Signed)
Did have evaluation with neurocognitive specialist, Dr. Carles Collet.  Patient had long discussion with provider who also spoke with patient's brother.  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  Decision was made to proceed with trial of carbidopa-levodopa.  He has been taking this medication, denies any reported side effects It was also recommended for patient to continue with physical therapy through [place] There is also discussion of patient eventually requiring higher level of care.  Patient currently in and independent living setting and will likely require more hands-on care to be provided Next follow-up with neurocognitive specialist is in May

## 2021-11-30 NOTE — Assessment & Plan Note (Signed)
Labs checked at last office visit and hemoglobin A1c has improved from 12.0% down to 6.7% Continue with current therapy which includes glipizide

## 2021-11-30 NOTE — Progress Notes (Signed)
    Procedures performed today:    None.  Independent interpretation of notes and tests performed by another provider:   None.  Brief History, Exam, Impression, and Recommendations:    BP 120/66   Pulse 76   Ht 6\' 1"  (1.854 m)   Wt 199 lb (90.3 kg)   SpO2 99%   BMI 26.25 kg/m   Diabetes (HCC) Labs checked at last office visit and hemoglobin A1c has improved from 12.0% down to 6.7% Continue with current therapy which includes glipizide  Neurocognitive disorder Did have evaluation with neurocognitive specialist, Dr. Carles Collet.  Patient had long discussion with provider who also spoke with patient's brother.  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  Decision was made to proceed with trial of carbidopa-levodopa.  He has been taking this medication, denies any reported side effects It was also recommended for patient to continue with physical therapy through [place] There is also discussion of patient eventually requiring higher level of care.  Patient currently in and independent living setting and will likely require more hands-on care to be provided Next follow-up with neurocognitive specialist is in May  Balance problem Related to underlying neurocognitive disorder, patient does have occasional falls Did have 1 recently that resulted in laceration to scalp.  On exam, area is well-healed with small scar remaining.  No continued tenderness, no surrounding erythema. Patient will continue to work with therapy at [facility].  Continue with use of walker.  Insomnia Continues to have issues with this, has established with psychiatry related to this as well as underlying depression Has had some medication changes including addition of citalopram as well as gabapentin Has questions today regarding continuing mirtazapine, advised that he discuss further with psychiatrist Also has orders today for vitamin D level from psychiatrist, labs will be completed here  today Recommend continued close follow-up with psychiatry regarding medication management  Plan for follow-up in about 2 months or sooner as needed   ___________________________________________ Journii Nierman de [place], MD, ABFM, CAQSM Primary Care and [place]

## 2021-12-01 LAB — VITAMIN D 25 HYDROXY (VIT D DEFICIENCY, FRACTURES): Vit D, 25-Hydroxy: 18.9 ng/mL — ABNORMAL LOW (ref 30.0–100.0)

## 2021-12-03 ENCOUNTER — Other Ambulatory Visit (HOSPITAL_BASED_OUTPATIENT_CLINIC_OR_DEPARTMENT_OTHER): Payer: Self-pay

## 2021-12-03 MED ORDER — VITAMIN D3 25 MCG (1000 UT) PO CAPS: 1000.0000 [IU] | ORAL_CAPSULE | Freq: Every day | ORAL | 1 refills | Status: DC

## 2021-12-03 NOTE — Progress Notes (Signed)
Per Dr. Tennis Must [place]'s recommendation patient to start a daily vit d supplement Orders placed

## 2021-12-06 ENCOUNTER — Telehealth (HOSPITAL_BASED_OUTPATIENT_CLINIC_OR_DEPARTMENT_OTHER): Payer: Self-pay

## 2021-12-06 MED ORDER — DONEPEZIL HCL 10 MG PO TABS: 10.0000 mg | ORAL_TABLET | Freq: Every day | ORAL | 1 refills | Status: AC

## 2021-12-06 NOTE — Telephone Encounter (Signed)
Patient called in stating that pharmacy called him to have a refill on his Aricept refilled Will authorize RX

## 2021-12-14 ENCOUNTER — Other Ambulatory Visit: Payer: Self-pay

## 2021-12-14 DIAGNOSIS — R1310 Dysphagia, unspecified: Secondary | ICD-10-CM

## 2021-12-14 DIAGNOSIS — G20 Parkinson's disease: Secondary | ICD-10-CM

## 2021-12-14 MED ORDER — CARBIDOPA-LEVODOPA 25-100 MG PO TABS: 1.0000 | ORAL_TABLET | Freq: Three times a day (TID) | ORAL | 0 refills | Status: DC

## 2022-01-19 ENCOUNTER — Emergency Department (HOSPITAL_BASED_OUTPATIENT_CLINIC_OR_DEPARTMENT_OTHER)
Admission: EM | Admit: 2022-01-19 | Discharge: 2022-01-19 | Disposition: A | Discharge status: Home / Self Care | Payer: Medicare HMO | Attending: Emergency Medicine | Admitting: Emergency Medicine

## 2022-01-19 ENCOUNTER — Emergency Department (HOSPITAL_BASED_OUTPATIENT_CLINIC_OR_DEPARTMENT_OTHER): Payer: Medicare HMO | Admitting: Radiology

## 2022-01-19 ENCOUNTER — Encounter (HOSPITAL_BASED_OUTPATIENT_CLINIC_OR_DEPARTMENT_OTHER): Payer: Self-pay | Admitting: Obstetrics and Gynecology

## 2022-01-19 ENCOUNTER — Other Ambulatory Visit: Payer: Self-pay

## 2022-01-19 DIAGNOSIS — R296 Repeated falls: Secondary | ICD-10-CM | POA: Insufficient documentation

## 2022-01-19 DIAGNOSIS — W19XXXA Unspecified fall, initial encounter: Secondary | ICD-10-CM

## 2022-01-19 DIAGNOSIS — S20219A Contusion of unspecified front wall of thorax, initial encounter: Secondary | ICD-10-CM | POA: Insufficient documentation

## 2022-01-19 DIAGNOSIS — W1830XA Fall on same level, unspecified, initial encounter: Secondary | ICD-10-CM | POA: Insufficient documentation

## 2022-01-19 DIAGNOSIS — S5012XA Contusion of left forearm, initial encounter: Secondary | ICD-10-CM | POA: Diagnosis not present

## 2022-01-19 DIAGNOSIS — S59912A Unspecified injury of left forearm, initial encounter: Secondary | ICD-10-CM | POA: Diagnosis present

## 2022-01-19 NOTE — ED Triage Notes (Signed)
Patient reports to the ER for fall last Friday. He lives at [facility] due to having PSP. Patient has discoloration to the left forearm.

## 2022-01-19 NOTE — ED Notes (Signed)
Son here to get pt pt ambulated to car with walkwer

## 2022-01-19 NOTE — ED Provider Notes (Signed)
[facility] EMERGENCY DEPT Provider Note   CSN: [id number] Arrival date & time: 01/19/22  1618     History  Chief Complaint  Patient presents with   Brandon Vargas is a 66 y.o. male.   Fall Pertinent negatives include no shortness of breath. Patient reportedly had a fall last Friday.  Hit left forearm.  Continued swelling.  States he has had other falls.  History of progressive supranuclear palsy.  No neck pain.  Not on blood thinners.  Mild pain with movement.  No numbness or weakness.  Does have some mild baseline confusion.      Home Medications Prior to Admission medications   Medication Sig Start Date End Date Taking? Authorizing Provider  ACCU-CHEK GUIDE test strip  05/15/20   [provider]  acetaminophen (TYLENOL) 325 MG tablet Take 650 mg by mouth every 6 (six) hours as needed.    [provider]  calcium carbonate (TUMS - DOSED IN MG ELEMENTAL CALCIUM) 500 MG chewable tablet Chew 2 tablets by mouth daily as needed for indigestion or heartburn.    [provider]  carbidopa-levodopa (SINEMET IR) 25-100 MG tablet Take 1 tablet by mouth 3 (three) times daily. 12/14/21   Tat, Eustace Quail, DO  Cholecalciferol (VITAMIN D3) 25 MCG (1000 UT) CAPS Take 1 capsule (1,000 Units total) by mouth daily. 12/03/21   de [place], Blondell Reveal, MD  citalopram (CELEXA) 10 MG tablet Take 10 mg by mouth daily.    [provider]  Cyanocobalamin (B-12) 1000 MCG CAPS Take 1 capsule by mouth every other day. 06/04/21   [provider]  donepezil (ARICEPT) 10 MG tablet Take 1 tablet (10 mg total) by mouth daily. 12/06/21   de [place], Raymond J, MD  gabapentin (NEURONTIN) 300 MG capsule Take 300 mg by mouth 3 (three) times daily.    [provider]  glipiZIDE (GLUCOTROL XL) 10 MG 24 hr tablet TAKE 1 TABLET EVERY DAY 05/13/20   [provider]  mirtazapine (REMERON) 7.5 MG tablet Take 7.5 mg by mouth at bedtime. 09/02/21    [provider]  pantoprazole (PROTONIX) 40 MG tablet Take 1 tablet (40 mg total) by mouth daily. 10/25/21   de [place], Raymond J, MD      Allergies    Patient has no known allergies.    Review of Systems   Review of Systems  Constitutional:  Negative for appetite change.  Respiratory:  Negative for shortness of breath.   Musculoskeletal:        Left forearm swelling.  Skin:        Left forearm swelling.  Hematological:  Bruises/bleeds easily.   Physical Exam Updated Vital Signs BP 126/72    Pulse (!) 58    Temp 98.4 F (36.9 C)    Resp (!) 22    Ht 6\' 1"  (1.854 m)    Wt 86.2 kg    SpO2 100%    BMI 25.07 kg/m  Physical Exam Vitals reviewed.  Cardiovascular:     Rate and Rhythm: Regular rhythm.  Pulmonary:     Comments: Small amount of ecchymosis on chest but no underlying tenderness. Abdominal:     Tenderness: There is no abdominal tenderness.  Musculoskeletal:     Comments: Left forearm with some swelling and bruising.  Stable.  Good range of motion.  Neurovascular intact in left hand.  Pulse intact.  Sensation intact distally.  Neurological:     Mental Status: He  is alert.    ED Results / Procedures / Treatments   Labs (all labs ordered are listed, but only abnormal results are displayed) Labs Reviewed - No data to display  EKG None  Radiology DG Elbow Complete Left  Result Date: 01/19/2022 CLINICAL DATA:  Trauma, fall EXAM: LEFT ELBOW - COMPLETE 3+ VIEW COMPARISON:  None. FINDINGS: No displaced fracture or dislocation is seen. There is no displacement of posterior fat pad. There is subcutaneous edema more so in the dorsal aspect. There are no opaque foreign bodies. IMPRESSION: No recent fracture or dislocation is seen in the left elbow. Electronically Signed   By: Elmer Picker M.D.   On: 01/19/2022 16:52   DG Forearm Left  Result Date: 01/19/2022 CLINICAL DATA:  Trauma, pain EXAM: LEFT FOREARM - 2 VIEW COMPARISON:  None. FINDINGS: No fracture or  dislocation is seen. There is subcutaneous edema, especially over the dorsum. There are no radiopaque foreign bodies. IMPRESSION: No fracture or dislocation is seen in the left forearm. Electronically Signed   By: Elmer Picker M.D.   On: 01/19/2022 16:53    Procedures Procedures    Medications Ordered in ED Medications - No data to display  ED Course/ Medical Decision Making/ A&P                           Medical Decision Making Problems Addressed: Contusion of left forearm, initial encounter: self-limited or minor problem  Amount and/or Complexity of Data Reviewed External Data Reviewed: notes.    Details: Neurology notes Radiology: ordered.   Patient presents with fall.  X-rays independently interpreted and showed no fracture.  Some bruising and skin thickening on forearm.  There is some green discoloration going up the upper arm 2.  I think this is healing wound.  Likely underlying hematoma.  No apparent fracture.  Doubt DVT.  Think this is likely healing wound.  Appears stable for discharge home.  Initial differential diagnosis included fracture cellulitis or muscular injury.          Final Clinical Impression(s) / ED Diagnoses Final diagnoses:  Fall, initial encounter  Contusion of left forearm, initial encounter    Rx / DC Orders ED Discharge Orders     None         Davonna Belling, MD 01/19/22 1800

## 2022-02-01 ENCOUNTER — Other Ambulatory Visit: Payer: Self-pay

## 2022-02-01 ENCOUNTER — Ambulatory Visit (INDEPENDENT_AMBULATORY_CARE_PROVIDER_SITE_OTHER): Payer: Medicare HMO | Admitting: Family Medicine

## 2022-02-01 ENCOUNTER — Encounter (HOSPITAL_BASED_OUTPATIENT_CLINIC_OR_DEPARTMENT_OTHER): Payer: Self-pay | Admitting: Family Medicine

## 2022-02-01 VITALS — BP 152/80 | HR 66 | Ht 73.0 in | Wt 199.0 lb

## 2022-02-01 DIAGNOSIS — R2689 Other abnormalities of gait and mobility: Secondary | ICD-10-CM

## 2022-02-01 DIAGNOSIS — F419 Anxiety disorder, unspecified: Secondary | ICD-10-CM | POA: Diagnosis not present

## 2022-02-01 DIAGNOSIS — Z23 Encounter for immunization: Secondary | ICD-10-CM | POA: Diagnosis not present

## 2022-02-01 DIAGNOSIS — E119 Type 2 diabetes mellitus without complications: Secondary | ICD-10-CM

## 2022-02-01 MED ORDER — GLIPIZIDE ER 10 MG PO TB24: 10.0000 mg | ORAL_TABLET | Freq: Every day | ORAL | 0 refills | Status: DC

## 2022-02-01 MED ORDER — CYCLOBENZAPRINE HCL 5 MG PO TABS: 5.0000 mg | ORAL_TABLET | Freq: Every evening | ORAL | 0 refills | Status: AC | PRN

## 2022-02-01 NOTE — Assessment & Plan Note (Signed)
No current issues related to this, denies any recent symptoms of low blood sugar Requesting refill of glipizide, reports being out for couple days Has questions about CGM, however given that he is only taking glipizide right now and has not had any significant hypoglycemic symptoms, feel that CGM would not be necessary.  Primary reason for requesting is that he does not like to be checking blood sugar twice daily.  Discussed that this generally would be unnecessary given that he is only on oral medication and can proceed with checking blood sugar only as needed if having symptoms concerning for hypo or hyperglycemia Refill of glipizide today We will check hemoglobin A1c today Administered pneumococcal vaccination today

## 2022-02-01 NOTE — Patient Instructions (Addendum)
°  Medication Instructions:  Your physician has recommended you make the following change in your medication:  -- START Flexeril 5 mg - take 1 tablet by mouth daily at night as needed --If you need a refill on any your medications before your next appointment, please call your pharmacy first. If no refills are authorized on file call the office.-- Lab Work: Your physician has recommended that you have lab work today: A1C If you have labs (blood work) drawn today and your tests are completely normal, you will receive your results via [facility] a phone call from our staff.  Please ensure you check your voicemail in the event that you authorized detailed messages to be left on a delegated number. If you have any lab test that is abnormal or we need to change your treatment, we will call you to review the results.  Follow-Up: Your next appointment:   Your physician recommends that you schedule a follow-up appointment in: 2 MONTHS with Dr. de [place]  You will receive a text message or e-mail with a link to a survey about your care and experience with [place] today! We would greatly appreciate your feedback!   Thanks for letting us be apart of your health journey!!  Primary Care and Sports Medicine   Dr. Arlina Robes [place]   We encourage you to activate your patient portal called "MyChart".  Sign up information is provided on this After Visit Summary.  MyChart is used to connect with patients for Virtual Visits (Telemedicine).  Patients are able to view lab/test results, encounter notes, upcoming appointments, etc.  Non-urgent messages can be sent to your provider as well. To learn more about what you can do with MyChart, please visit --  [email or web address].

## 2022-02-01 NOTE — Assessment & Plan Note (Signed)
Continues to have issues with balance related to underlying neurocognitive disorder Has been working with physical therapy regarding this, indicates this is going well Has had some falls since last visit with me, did have recent emergency department visit a couple weeks ago with injury to left arm, no fracture observed on imaging Recommend continuing with physical therapy, continue follow-up with neurocognitive specialist

## 2022-02-01 NOTE — Assessment & Plan Note (Signed)
Continues to follow with psychiatry related to this as well as insomnia Did have recent change to medication, no longer taking citalopram Continues with mirtazapine Reports that next appointment is on Monday, encouraged to continue with close follow-up with psychiatry

## 2022-02-01 NOTE — Progress Notes (Signed)
° ° °  Procedures performed today:    None.  Independent interpretation of notes and tests performed by another provider:   None.  Brief History, Exam, Impression, and Recommendations:    BP (!) 152/80    Pulse 66    Ht 6\' 1"  (1.854 m)    Wt 199 lb (90.3 kg)    SpO2 99%    BMI 26.25 kg/m   Diabetes (HCC) No current issues related to this, denies any recent symptoms of low blood sugar Requesting refill of glipizide, reports being out for couple days Has questions about CGM, however given that he is only taking glipizide right now and has not had any significant hypoglycemic symptoms, feel that CGM would not be necessary.  Primary reason for requesting is that he does not like to be checking blood sugar twice daily.  Discussed that this generally would be unnecessary given that he is only on oral medication and can proceed with checking blood sugar only as needed if having symptoms concerning for hypo or hyperglycemia Refill of glipizide today We will check hemoglobin A1c today Administered pneumococcal vaccination today  Anxiety Continues to follow with psychiatry related to this as well as insomnia Did have recent change to medication, no longer taking citalopram Continues with mirtazapine Reports that next appointment is on Monday, encouraged to continue with close follow-up with psychiatry  Balance problem Continues to have issues with balance related to underlying neurocognitive disorder Has been working with physical therapy regarding this, indicates this is going well Has had some falls since last visit with me, did have recent emergency department visit a couple weeks ago with injury to left arm, no fracture observed on imaging Recommend continuing with physical therapy, continue follow-up with neurocognitive specialist  Prescription sent to pharmacy for low-dose of cyclobenzaprine for paraspinal muscle spasm.  Discussed concern with patient regarding use given underlying  balance issues and risk for drowsiness, impaired balance.  If noticing side effects related to this, advised that he discontinue medication.  He indicates agreement and understanding  Plan for follow-up in about 2 months or sooner as needed   ___________________________________________ Judith Campillo de [place], MD, ABFM, CAQSM Primary Care and [employer]

## 2022-02-02 LAB — HEMOGLOBIN A1C
Est. average glucose Bld gHb Est-mCnc: 151 mg/dL
Hgb A1c MFr Bld: 6.9 % — ABNORMAL HIGH (ref 4.8–5.6)

## 2022-02-04 ENCOUNTER — Telehealth (HOSPITAL_BASED_OUTPATIENT_CLINIC_OR_DEPARTMENT_OTHER): Payer: Self-pay

## 2022-02-04 NOTE — Telephone Encounter (Signed)
Per DPR left detailed voicemail of lab results sand recommendations Instructed patient that results are available for his review via mychart Instructed the patient to call the office with any questions or concerns

## 2022-02-04 NOTE — Telephone Encounter (Signed)
-----   Message from Raymond J de [place], MD sent at 02/03/2022 11:50 AM EST ----- Hemoglobin A1c remains at goal at 6.9%.  Would continue with glipizide at this time.

## 2022-02-15 ENCOUNTER — Emergency Department (HOSPITAL_BASED_OUTPATIENT_CLINIC_OR_DEPARTMENT_OTHER): Payer: Medicare HMO

## 2022-02-15 ENCOUNTER — Encounter (HOSPITAL_BASED_OUTPATIENT_CLINIC_OR_DEPARTMENT_OTHER): Payer: Self-pay | Admitting: Emergency Medicine

## 2022-02-15 ENCOUNTER — Other Ambulatory Visit (HOSPITAL_BASED_OUTPATIENT_CLINIC_OR_DEPARTMENT_OTHER): Payer: Self-pay

## 2022-02-15 ENCOUNTER — Other Ambulatory Visit: Payer: Self-pay

## 2022-02-15 ENCOUNTER — Emergency Department (HOSPITAL_BASED_OUTPATIENT_CLINIC_OR_DEPARTMENT_OTHER)
Admission: EM | Admit: 2022-02-15 | Discharge: 2022-02-15 | Disposition: A | Discharge status: Home / Self Care | Payer: Medicare HMO | Attending: Emergency Medicine | Admitting: Emergency Medicine

## 2022-02-15 DIAGNOSIS — W19XXXA Unspecified fall, initial encounter: Secondary | ICD-10-CM

## 2022-02-15 DIAGNOSIS — S32018A Other fracture of first lumbar vertebra, initial encounter for closed fracture: Secondary | ICD-10-CM | POA: Diagnosis not present

## 2022-02-15 DIAGNOSIS — S32028A Other fracture of second lumbar vertebra, initial encounter for closed fracture: Secondary | ICD-10-CM | POA: Insufficient documentation

## 2022-02-15 DIAGNOSIS — S32009A Unspecified fracture of unspecified lumbar vertebra, initial encounter for closed fracture: Secondary | ICD-10-CM

## 2022-02-15 DIAGNOSIS — E119 Type 2 diabetes mellitus without complications: Secondary | ICD-10-CM | POA: Insufficient documentation

## 2022-02-15 DIAGNOSIS — Z20822 Contact with and (suspected) exposure to covid-19: Secondary | ICD-10-CM | POA: Insufficient documentation

## 2022-02-15 DIAGNOSIS — R918 Other nonspecific abnormal finding of lung field: Secondary | ICD-10-CM | POA: Diagnosis not present

## 2022-02-15 DIAGNOSIS — W050XXA Fall from non-moving wheelchair, initial encounter: Secondary | ICD-10-CM | POA: Diagnosis not present

## 2022-02-15 DIAGNOSIS — M542 Cervicalgia: Secondary | ICD-10-CM | POA: Diagnosis not present

## 2022-02-15 DIAGNOSIS — S32038A Other fracture of third lumbar vertebra, initial encounter for closed fracture: Secondary | ICD-10-CM | POA: Diagnosis not present

## 2022-02-15 DIAGNOSIS — S2231XA Fracture of one rib, right side, initial encounter for closed fracture: Secondary | ICD-10-CM | POA: Insufficient documentation

## 2022-02-15 DIAGNOSIS — R109 Unspecified abdominal pain: Secondary | ICD-10-CM | POA: Insufficient documentation

## 2022-02-15 DIAGNOSIS — Z7984 Long term (current) use of oral hypoglycemic drugs: Secondary | ICD-10-CM | POA: Diagnosis not present

## 2022-02-15 DIAGNOSIS — S299XXA Unspecified injury of thorax, initial encounter: Secondary | ICD-10-CM | POA: Diagnosis present

## 2022-02-15 LAB — COMPREHENSIVE METABOLIC PANEL
ALT: 17 U/L (ref 0–44)
AST: 18 U/L (ref 15–41)
Albumin: 3.8 g/dL (ref 3.5–5.0)
Alkaline Phosphatase: 72 U/L (ref 38–126)
Anion gap: 8 (ref 5–15)
BUN: 14 mg/dL (ref 8–23)
CO2: 27 mmol/L (ref 22–32)
Calcium: 9 mg/dL (ref 8.9–10.3)
Chloride: 105 mmol/L (ref 98–111)
Creatinine, Ser: 0.95 mg/dL (ref 0.61–1.24)
GFR, Estimated: >60 mL/min (ref >60)
Glucose, Bld: 204 mg/dL — ABNORMAL HIGH (ref 70–99)
Potassium: 3.9 mmol/L (ref 3.5–5.1)
Sodium: 140 mmol/L (ref 135–145)
Total Bilirubin: 0.4 mg/dL (ref 0.3–1.2)
Total Protein: 6.2 g/dL — ABNORMAL LOW (ref 6.5–8.1)

## 2022-02-15 LAB — CBG MONITORING, ED
Glucose-Capillary: 265 mg/dL — ABNORMAL HIGH (ref 70–99)
Glucose-Capillary: 110 mg/dL — ABNORMAL HIGH (ref 70–99)

## 2022-02-15 LAB — CBC
HCT: 38.8 % — ABNORMAL LOW (ref 39.0–52.0)
Hemoglobin: 13 g/dL (ref 13.0–17.0)
MCH: 27.8 pg (ref 26.0–34.0)
MCHC: 33.5 g/dL (ref 30.0–36.0)
MCV: 83.1 fL (ref 80.0–100.0)
Platelets: 198 10*3/uL (ref 150–400)
RBC: 4.67 MIL/uL (ref 4.22–5.81)
RDW: 12.9 % (ref 11.5–15.5)
WBC: 10.4 10*3/uL (ref 4.0–10.5)
nRBC: 0 % (ref 0.0–0.2)

## 2022-02-15 LAB — RESP PANEL BY RT-PCR (FLU A&B, COVID) ARPGX2
Influenza A by PCR: NEGATIVE
Influenza B by PCR: NEGATIVE
SARS Coronavirus 2 by RT PCR: NEGATIVE

## 2022-02-15 MED ORDER — GLIPIZIDE ER 10 MG PO TB24
10.0000 mg | ORAL_TABLET | Freq: Every day | ORAL | Status: DC
  Filled 2022-02-15 (×2): qty 1

## 2022-02-15 MED ORDER — CYCLOBENZAPRINE HCL 5 MG PO TABS: 5.0000 mg | ORAL_TABLET | Freq: Every evening | ORAL | Status: DC | PRN

## 2022-02-15 MED ORDER — CARBIDOPA-LEVODOPA 25-100 MG PO TABS
1.0000 | ORAL_TABLET | Freq: Three times a day (TID) | ORAL | Status: DC
  Filled 2022-02-15: qty 1

## 2022-02-15 MED ORDER — OXYCODONE-ACETAMINOPHEN 5-325 MG PO TABS
1.0000 | ORAL_TABLET | Freq: Four times a day (QID) | ORAL | 0 refills | Status: DC | PRN
  Filled 2022-02-15: qty 15, 4d supply, fill #0

## 2022-02-15 MED ORDER — ACETAMINOPHEN 325 MG PO TABS: 650.0000 mg | ORAL_TABLET | Freq: Four times a day (QID) | ORAL | Status: DC | PRN

## 2022-02-15 MED ORDER — TAMSULOSIN HCL 0.4 MG PO CAPS
0.4000 mg | ORAL_CAPSULE | Freq: Every day | ORAL | Status: DC
Start: 2022-02-16 — End: 2022-02-15

## 2022-02-15 MED ORDER — TAMSULOSIN HCL 0.4 MG PO CAPS
0.4000 mg | ORAL_CAPSULE | Freq: Every day | ORAL | Status: DC
  Filled 2022-02-15: qty 1

## 2022-02-15 MED ORDER — IOHEXOL 300 MG/ML  SOLN
80.0000 mL | Freq: Once | INTRAMUSCULAR | Status: AC | PRN
  Administered 2022-02-15: 80 mL via INTRAVENOUS

## 2022-02-15 MED ORDER — LACTATED RINGERS IV BOLUS
1000.0000 mL | Freq: Once | INTRAVENOUS | Status: AC
  Administered 2022-02-15: 1000 mL via INTRAVENOUS

## 2022-02-15 MED ORDER — GABAPENTIN 300 MG PO CAPS
300.0000 mg | ORAL_CAPSULE | Freq: Three times a day (TID) | ORAL | Status: DC
  Administered 2022-02-15: 300 mg via ORAL
  Filled 2022-02-15: qty 1

## 2022-02-15 MED ORDER — FENTANYL CITRATE PF 50 MCG/ML IJ SOSY
50.0000 ug | PREFILLED_SYRINGE | Freq: Once | INTRAMUSCULAR | Status: AC
  Administered 2022-02-15: 50 ug via INTRAVENOUS
  Filled 2022-02-15: qty 1

## 2022-02-15 MED ORDER — MIRTAZAPINE 7.5 MG PO TABS
7.5000 mg | ORAL_TABLET | Freq: Every day | ORAL | Status: DC
Start: 2022-02-15 — End: 2022-02-15
  Filled 2022-02-15: qty 1

## 2022-02-15 MED ORDER — HYDROCODONE-ACETAMINOPHEN 5-325 MG PO TABS
2.0000 | ORAL_TABLET | Freq: Once | ORAL | Status: AC
  Administered 2022-02-15: 2 via ORAL
  Filled 2022-02-15: qty 2

## 2022-02-15 MED ORDER — LORAZEPAM 2 MG/ML IJ SOLN
1.0000 mg | Freq: Once | INTRAMUSCULAR | Status: AC
  Administered 2022-02-15: 1 mg via INTRAVENOUS
  Filled 2022-02-15: qty 1

## 2022-02-15 MED ORDER — IOHEXOL 300 MG/ML  SOLN
60.0000 mL | Freq: Once | INTRAMUSCULAR | Status: AC | PRN
  Administered 2022-02-15: 60 mL via INTRAVENOUS

## 2022-02-15 MED ORDER — DONEPEZIL HCL 10 MG PO TABS
10.0000 mg | ORAL_TABLET | Freq: Every day | ORAL | Status: DC
  Filled 2022-02-15: qty 1

## 2022-02-15 NOTE — ED Notes (Signed)
Patient transported to CT 

## 2022-02-15 NOTE — ED Provider Notes (Signed)
[facility] EMERGENCY DEPT Provider Note   CSN: [id number] Arrival date & time: 02/15/22  4098     History  Chief Complaint  Patient presents with   Brandon Vargas is a 66 y.o. male.   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 Fell at [facility] today, described a mechanical fall when trying to transition up to standing from sitting in a chair.   Since the fall, the patient is complained of neck pain and low back pain in his lumbar spine.  He is ambulatory and has been since the fall.  He continues to endorse sharp pain in his lower back and some pain with range of motion of his neck.  He also endorses rib pain.  He denies any loss of consciousness.  The fall was witnessed by staff.  Per staff report and EMS, the patient has reportedly had multiple falls over the past week.  He arrived GCS 15, ABC intact, at his baseline mental status.  Home Medications Prior to Admission medications   Medication Sig Start Date End Date Taking? Authorizing Provider  acetaminophen (TYLENOL) 325 MG tablet Take 650 mg by mouth every 6 (six) hours as needed.   Yes [provider]  calcium carbonate (TUMS - DOSED IN MG ELEMENTAL CALCIUM) 500 MG chewable tablet Chew 2 tablets by mouth daily as needed for indigestion or heartburn.   Yes [provider]  carbidopa-levodopa (SINEMET IR) 25-100 MG tablet Take 1 tablet by mouth 3 (three) times daily. 12/14/21  Yes Tat, Eustace Quail, DO  Cholecalciferol (VITAMIN D3) 25 MCG (1000 UT) CAPS Take 1 capsule (1,000 Units total) by mouth daily. 12/03/21  Yes de [place], Raymond J, MD  cyclobenzaprine (FLEXERIL) 5 MG tablet Take 1 tablet (5 mg total) by mouth at bedtime as needed for muscle spasms.  02/01/22  Yes de [place], Raymond J, MD  donepezil (ARICEPT) 10 MG tablet Take 1 tablet (10 mg total) by mouth daily. 12/06/21  Yes de [place], Raymond J, MD  gabapentin (NEURONTIN) 300 MG capsule Take 300 mg by mouth 3 (three) times daily.   Yes [provider]  glipiZIDE (GLUCOTROL XL) 10 MG 24 hr tablet Take 1 tablet (10 mg total) by mouth daily. 02/01/22  Yes de [place], Raymond J, MD  mirtazapine (REMERON) 7.5 MG tablet Take 7.5 mg by mouth at bedtime. 09/02/21  Yes [provider]  oxyCODONE-acetaminophen (PERCOCET/ROXICET) 5-325 MG tablet Take 1 tablet by mouth every 6 (six) hours as needed for severe pain. 02/15/22  Yes Regan Lemming, MD  tamsulosin (FLOMAX) 0.4 MG CAPS capsule Take 0.4 mg by mouth daily.   Yes [provider]  ACCU-CHEK GUIDE test strip  05/15/20   [provider]  pantoprazole (PROTONIX) 40 MG tablet Take 1 tablet (40 mg total) by mouth daily. 02/16/22   de [place], Raymond J, MD      Allergies    Patient has no known allergies.    Review of Systems   Review of Systems  Musculoskeletal:  Positive for back pain and neck pain.  All other systems reviewed and are negative.  Physical Exam Updated Vital Signs BP 128/72 (BP Location: Right Arm)    Pulse 61    Temp 99.4 F (37.4 C)  Resp (!) 21    Ht 6\' 1"  (1.854 m)    Wt 90.3 kg    SpO2 96%    BMI 26.25 kg/m  Physical Exam Vitals and nursing note reviewed.  Constitutional:      Appearance: He is well-developed.     Comments: GCS 15, ABC intact  HENT:     Head: Normocephalic.  Eyes:     Conjunctiva/sclera: Conjunctivae normal.  Neck:     Comments: Mild midline tenderness to palpation of the cervical spine. ROM intact. Cardiovascular:     Rate and Rhythm: Normal rate and regular rhythm.     Heart sounds: No murmur heard. Pulmonary:     Effort: Pulmonary effort is normal. No respiratory distress.     Breath sounds: Normal breath sounds.  Chest:     Comments: Chest wall stable and  non-tender to AP and lateral compression. Clavicles stable and non-tender to AP compression Abdominal:     Palpations: Abdomen is soft.     Tenderness: There is no abdominal tenderness.     Comments: Pelvis stable to lateral compression.  Musculoskeletal:     Cervical back: Neck supple.     Comments: No midline tenderness to palpation of the thoracic spine. Midline tenderness of the lumbar spine. Extremities atraumatic with intact ROM.   Skin:    General: Skin is warm and dry.  Neurological:     Mental Status: He is alert.     Comments: CN II-XII grossly intact. Moving all four extremities spontaneously and sensation grossly intact.    ED Results / Procedures / Treatments   Labs (all labs ordered are listed, but only abnormal results are displayed) Labs Reviewed  COMPREHENSIVE METABOLIC PANEL - Abnormal; Notable for the following components:      Result Value   Glucose, Bld 204 (*)    Total Protein 6.2 (*)    All other components within normal limits  CBC - Abnormal; Notable for the following components:   HCT 38.8 (*)    All other components within normal limits  CBG MONITORING, ED - Abnormal; Notable for the following components:   Glucose-Capillary 265 (*)    All other components within normal limits  CBG MONITORING, ED - Abnormal; Notable for the following components:   Glucose-Capillary 110 (*)    All other components within normal limits  RESP PANEL BY RT-PCR (FLU A&B, COVID) [id number]    EKG EKG Interpretation  Date/Time:  Tuesday February 15 2022 09:43:20 EST Ventricular Rate:  71 PR Interval:  190 QRS Duration: 72 QT Interval:  388 QTC Calculation: 421 R Axis:   3 Text Interpretation: Normal sinus rhythm Low voltage QRS Cannot rule out Anterior infarct , age undetermined Abnormal ECG When compared with ECG of 22-Oct-2019 13:47, No significant change was found Confirmed by Regan Lemming ([id number]) on 02/15/2022 10:48:34 AM  Radiology CT HEAD WO CONTRAST  Result  Date: 02/15/2022 CLINICAL DATA:  Fall EXAM: CT HEAD WITHOUT CONTRAST TECHNIQUE: Contiguous axial images were obtained from the base of the skull through the vertex without intravenous contrast. RADIATION DOSE REDUCTION: This exam was performed according to the departmental dose-optimization program which includes automated exposure control, adjustment of the mA and/or kV according to patient size and/or use of iterative reconstruction technique. COMPARISON:  Head CT 10/08/2021 FINDINGS: Brain: There is no evidence of acute intracranial hemorrhage, extra-axial fluid collection, or acute infarct. There is mild global parenchymal volume loss with prominence of the ventricular system and extra-axial CSF spaces. Gray-white  differentiation is preserved. There is no mass lesion. There is no midline shift. Vascular: No hyperdense vessel or unexpected calcification. Skull: Normal. Negative for fracture or focal lesion. Sinuses/Orbits: Imaged paranasal sinuses are clear. The imaged globes and orbits are unremarkable. Other: None. IMPRESSION: No acute intracranial hemorrhage or calvarial fracture. Electronically Signed   By: Valetta Mole M.D.   On: 02/15/2022 11:37   CT Chest W Contrast  Result Date: 02/15/2022 CLINICAL DATA:  Fall, blunt trauma.  Right twelfth rib fracture EXAM: CT CHEST WITH CONTRAST TECHNIQUE: Multidetector CT imaging of the chest was performed during intravenous contrast administration. RADIATION DOSE REDUCTION: This exam was performed according to the departmental dose-optimization program which includes automated exposure control, adjustment of the mA and/or kV according to patient size and/or use of iterative reconstruction technique. CONTRAST:  62mL OMNIPAQUE IOHEXOL 300 MG/ML  SOLN COMPARISON:  None. FINDINGS: Cardiovascular: Heart size is normal. No pericardial effusion. Thoracic aorta is nonaneurysmal. Scattered atherosclerotic calcifications of the aorta and coronary arteries. Central pulmonary  vasculature is nondilated. Mediastinum/Nodes: No pathologically enlarged axillary, mediastinal, or hilar lymph nodes. Thyroid, trachea, and esophagus within normal limits. Lungs/Pleura: Irregularly marginated right upper lobe pulmonary nodule with solid and ground-glass components measuring approximately 2.2 x 2.1 x 2.1 cm (series 4, image 52). Central solid component measures up to 1.1 cm in maximal dimension. No additional pulmonary nodules. Lungs are otherwise clear. No pleural effusion or pneumothorax. Upper Abdomen: See dedicated CT abdomen pelvis report for full characterization. Musculoskeletal: Remote healed fractures of the left seventh, eighth, and ninth ribs. No acute rib fracture is seen. Known right twelfth rib fracture is not included within the field of view. Thoracic vertebral body heights and alignment are maintained without fracture or traumatic listhesis. No chest wall hematoma. IMPRESSION: 1. Irregularly marginated right upper lobe pulmonary nodule with solid and ground-glass components measuring up to 2.2 cm in maximal dimension. Findings are concerning for primary bronchogenic carcinoma. Consider one of the following in 3 months for both low-risk and high-risk individuals: (a) repeat chest CT, (b) follow-up PET-CT, or (c) tissue sampling. This recommendation follows the consensus statement: Guidelines for Management of Incidental Pulmonary Nodules Detected on CT Images: From the Fleischner Society 2017; Radiology 2017; 284:228-243. 2. Remote healed fractures of the left seventh, eighth, and ninth ribs. No acute rib fracture is seen. Known right twelfth rib fracture is not included within the field of view. 3. No acute fracture or traumatic listhesis of the thoracic spine. Aortic Atherosclerosis (ICD10-I70.0). Electronically Signed   By: Davina Poke D.O.   On: 02/15/2022 15:03   CT CERVICAL SPINE WO CONTRAST  Result Date: 02/15/2022 CLINICAL DATA:  Neck trauma, fall EXAM: CT CERVICAL  SPINE WITHOUT CONTRAST TECHNIQUE: Multidetector CT imaging of the cervical spine was performed without intravenous contrast. Multiplanar CT image reconstructions were also generated. RADIATION DOSE REDUCTION: This exam was performed according to the departmental dose-optimization program which includes automated exposure control, adjustment of the mA and/or kV according to patient size and/or use of iterative reconstruction technique. COMPARISON:  Cervical spine CT 10/08/2021 FINDINGS: Alignment: There is reversal of the normal cervical spine lordosis centered at C5-C6, unchanged. There is no antero or retrolisthesis. There is no jumped or perched facets or other evidence of traumatic malalignment. Skull base and vertebrae: Skull base alignment is maintained. Vertebral body heights are preserved. There is no evidence of acute fracture. Postsurgical changes reflecting left laminectomy at C4 and C5 and bilateral laminectomies at C7 are noted. Soft tissues and spinal  canal: No prevertebral fluid or swelling. No visible canal hematoma. Disc levels: There are bulky anterior osteophytes at C4 through C7. There is advanced multilevel facet arthropathy throughout the cervical spine resulting in severe bilateral neural foraminal stenosis at C3-C4, severe right neural foraminal stenosis at C4-C5, and moderate bilateral neural foraminal stenosis at C5-C6. Upper chest: The imaged lung apices are clear. Other: None. IMPRESSION: 1. No acute fracture or traumatic malalignment of the cervical spine. 2. Advanced multilevel degenerative changes as detailed above. Electronically Signed   By: Valetta Mole M.D.   On: 02/15/2022 11:45   CT Lumbar Spine Wo Contrast  Result Date: 02/15/2022 CLINICAL DATA:  Back trauma, fall EXAM: CT LUMBAR SPINE WITHOUT CONTRAST TECHNIQUE: Multidetector CT imaging of the lumbar spine was performed without intravenous contrast administration. Multiplanar CT image reconstructions were also generated.  RADIATION DOSE REDUCTION: This exam was performed according to the departmental dose-optimization program which includes automated exposure control, adjustment of the mA and/or kV according to patient size and/or use of iterative reconstruction technique. COMPARISON:  None. FINDINGS: Segmentation: Standard; the lowest formed disc space is designated L5-S1. Alignment: Normal. There is no antero or retrolisthesis. There is no evidence of traumatic malalignment. Vertebrae: Vertebral body heights are preserved. Are acute fractures of the right L1 through L3 transverse processes, minimally displaced at L2. There is no other evidence of acute fracture. There is no suspicious osseous lesion. Paraspinal and other soft tissues: There is mild calcified atherosclerotic plaque of the abdominal aorta. The paraspinal soft tissues are unremarkable. Disc levels: There is intervertebral disc space narrowing with vacuum disc phenomenon at L1-L2 and L5-S1. There is mild multilevel degenerative endplate change and facet arthropathy throughout the lumbar spine, with facet arthropathy most advanced at L5-S1. T12-L1: There is a mild disc bulge and mild bilateral facet arthropathy resulting in mild spinal canal stenosis without significant neural foraminal stenosis L2-L3: There is a mild disc bulge and mild bilateral facet arthropathy resulting in mild spinal canal stenosis without significant neural foraminal stenosis. L3-L4: There is a mild disc bulge, bilateral facet arthropathy, and ligamentum flavum thickening resulting in mild-to-moderate spinal canal stenosis and mild bilateral neural foraminal stenosis. L4-L5: There is a mild diffuse disc bulge, ligamentum flavum thickening, and bilateral facet arthropathy resulting in mild-to-moderate spinal canal stenosis and mild bilateral neural foraminal stenosis L5-S1: There is a diffuse disc bulge, degenerative endplate change, and bilateral facet arthropathy resulting in moderate bilateral  neural foraminal stenosis without definite spinal canal stenosis. IMPRESSION: 1. Acute fractures of the right L1 through L3 transverse processes, minimally displaced at L2. 2. Multilevel degenerative changes as above, most advanced at L5-S1. Electronically Signed   By: Valetta Mole M.D.   On: 02/15/2022 12:08   CT ABDOMEN PELVIS W CONTRAST  Result Date: 02/15/2022 CLINICAL DATA:  [place], blunt trauma EXAM: CT ABDOMEN AND PELVIS WITH CONTRAST TECHNIQUE: Multidetector CT imaging of the abdomen and pelvis was performed using the standard protocol following bolus administration of intravenous contrast. RADIATION DOSE REDUCTION: This exam was performed according to the departmental dose-optimization program which includes automated exposure control, adjustment of the mA and/or kV according to patient size and/or use of iterative reconstruction technique. CONTRAST:  62mL OMNIPAQUE IOHEXOL 300 MG/ML  SOLN COMPARISON:  None. FINDINGS: Lower chest: No pleural or pericardial effusion. Hepatobiliary: Small hyperdensity in the dependent aspect of the nondilated gallbladder suggesting partially calcified stone. No biliary ductal dilatation. No focal liver lesion. Pancreas: Unremarkable. No pancreatic ductal dilatation or surrounding inflammatory changes. Spleen: Normal  in size without focal abnormality. Adrenals/Urinary Tract: Adrenal glands are unremarkable. Kidneys are normal, without renal calculi, focal lesion, or hydronephrosis. Bladder is unremarkable. Stomach/Bowel: Stomach is incompletely distended, unremarkable. Small bowel decompressed. Normal appendix. The colon is nondilated, unremarkable. Vascular/Lymphatic: Mild aortoiliac calcified atheromatous plaque without aneurysm or stenosis. No abdominal or pelvic adenopathy. Reproductive: Mild prostate enlargement. Other: No ascites.  No free air. Musculoskeletal: Minimally distracted fractures of the right transverse are processes of L1, L2, and L3. Minimally displaced  fracture, lateral aspect right twelfth rib. Bilateral hip DJD. IMPRESSION: 1. Fractures of right twelfth rib; right L1 L2 and L3 transverse processes. 2. No pleural effusion or pneumothorax. 3. Cholelithiasis 4.  Aortic Atherosclerosis (ICD10-170.0). Electronically Signed   By: Lucrezia Europe M.D.   On: 02/15/2022 13:33    Procedures Procedures    Medications Ordered in ED Medications  fentaNYL (SUBLIMAZE) injection 50 mcg (50 mcg Intravenous Given 02/15/22 1250)  iohexol (OMNIPAQUE) 300 MG/ML solution 80 mL (80 mLs Intravenous Contrast Given 02/15/22 1309)  lactated ringers bolus 1,000 mL (1,000 mLs Intravenous New Bag/Given 02/15/22 1448)  iohexol (OMNIPAQUE) 300 MG/ML solution 60 mL (60 mLs Intravenous Contrast Given 02/15/22 1431)  HYDROcodone-acetaminophen (NORCO/VICODIN) 5-325 MG per tablet 2 tablet (2 tablets Oral Given 02/15/22 1452)  LORazepam (ATIVAN) injection 1 mg (1 mg Intravenous Given 02/15/22 1639)    ED Course/ Medical Decision Making/ A&P                           Medical Decision Making Amount and/or Complexity of Data Reviewed Labs: ordered. Radiology: ordered.  Risk Prescription drug management.   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 Fell at [facility] today, described a mechanical fall when trying to transition up to standing from sitting in a chair.   Since the fall, the patient is complained of neck pain and low back pain in his lumbar spine.  He is ambulatory and has been since the fall.  He continues to endorse sharp pain in his lower back and some pain with range of motion of his neck.  He also endorses rib pain.  He denies any loss of consciousness.  The fall was witnessed by staff.  Per staff report and EMS, the patient has reportedly  had multiple falls over the past week.  He arrived GCS 15, ABC intact, at his baseline mental status.  Currently, he is awake, alert, and protecting his own airway and is hemodynamically stable.  Trauma imaging revealed (full reports in EMR): CT scans (pan-scan):  CT Chest:    IMPRESSION:  1. Irregularly marginated right upper lobe pulmonary nodule with  solid and ground-glass components measuring up to 2.2 cm in maximal  dimension. Findings are concerning for primary bronchogenic  carcinoma. Consider one of the following in 3 months for both  low-risk and high-risk individuals: (a) repeat chest CT, (b)  follow-up PET-CT, or (c) tissue sampling. This recommendation  follows the consensus statement: Guidelines for Management of  Incidental Pulmonary Nodules Detected on CT Images: From the  Fleischner Society 2017; Radiology 2017; 284:228-243.  2. Remote healed fractures of the left seventh, eighth, and ninth  ribs. No acute rib fracture is seen. Known right twelfth rib  fracture is not included within the field of view.  3.  No acute fracture or traumatic listhesis of the thoracic spine.   Ct Abdomen Pelvis: IMPRESSION:  1. Fractures of right twelfth rib; right L1 L2 and L3 transverse  processes.  2. No pleural effusion or pneumothorax.  3. Cholelithiasis  4.  Aortic Atherosclerosis (ICD10-170.0).   CT Cervical Spine: IMPRESSION:  1. No acute fracture or traumatic malalignment of the cervical  spine.  2. Advanced multilevel degenerative changes as detailed above.   CT Head: IMPRESSION:  No acute intracranial hemorrhage or calvarial fracture.    There were no significant lab abnormalities.  The patient received IV Fentanyl, Norco, and Ativan while in the ED.  On reassessment, the patient was ambulatory and at his neurologic baseline. He was stable for discharge back to his facility. A referral to neurosurgery was placed for outpatient follow-up given his TP process  fractures. A prescription for percocet was provided. He was also found to have an isolated fracture of his right 12th rib.   Additionally, the patient was found to have an incidental finding on CT chest with a 2.2cm lung nodule concerning for primary bronchogenic carcinoma. The patient's PCP and Dr. Valeta Harms were messaged in Epic regarding these findings to establish follow-up.   Prior to discharge, the patient had an episode of anxiousness. I assessed him bedside. He remains at his baseline neurologic status, GCS 15, ABC intact, AAOx3. He is ambulatory and wanting to leave. We are currently waiting on [facility] for transport. Family is on the way. Patient was administered oral Ativan with subsequent improvement. Stable for discharge and continued outpatient management of his spine and lung findings.      Final Clinical Impression(s) / ED Diagnoses Final diagnoses:  Closed fracture of one rib of right side, initial encounter  Closed fracture of transverse process of lumbar vertebra, initial encounter [facility])  Lung mass  Fall, initial encounter    Rx / DC Orders ED Discharge Orders          Ordered    Ambulatory referral to Spine Surgery        02/15/22 1555    oxyCODONE-acetaminophen (PERCOCET/ROXICET) 5-325 MG tablet  Every 6 hours PRN        02/15/22 1556              Regan Lemming, MD 02/17/22 [phone]

## 2022-02-15 NOTE — ED Notes (Addendum)
Patient became restless attempting to get out of bed and leave.  Not accepting redirection.  Appearing unsteady on feet.  Nursing tech at bedside to stay with patient for safety.  Dr. Armandina Gemma informed and reexamined patient.

## 2022-02-15 NOTE — Discharge Instructions (Addendum)
You were evaluated in the Emergency Department and after careful evaluation, we did not find any emergent condition requiring admission or further testing in the hospital.  Your exam/testing today was significant for 3 transverse process fractures in your lumbar spine.  Commend follow-up with spine surgery in clinic for outpatient assessment.  Phone number has been provided to schedule an appointment.  You also have an isolated rib fracture on the right.  Recommend deep breathing exercises and Percocet for pain control.  Please return to the Emergency Department if you experience any worsening of your condition.  Thank you for allowing [place] to be a part of your care.

## 2022-02-15 NOTE — ED Triage Notes (Signed)
Pt arrives to ED via [facility] with c/o fall. Pt reports he had a witnessed fall today by staff at [facility]. The fall was mechanical. Staff denied injury to head/neck. No LOC. Staff reports that he has had multiple falls over the last week.

## 2022-02-15 NOTE — ED Notes (Addendum)
Home agency from assisted living at bedside.  States that patient will have home care to assist patient over night.  Son at bedside to transport patient.  Reviewed discharge instructions and gave verbal understanding of such.  Patient appears less restless at present.

## 2022-02-16 ENCOUNTER — Other Ambulatory Visit (HOSPITAL_BASED_OUTPATIENT_CLINIC_OR_DEPARTMENT_OTHER): Payer: Self-pay | Admitting: Family Medicine

## 2022-02-16 ENCOUNTER — Other Ambulatory Visit (HOSPITAL_BASED_OUTPATIENT_CLINIC_OR_DEPARTMENT_OTHER): Payer: Self-pay

## 2022-02-16 MED ORDER — PANTOPRAZOLE SODIUM 40 MG PO TBEC: 40.0000 mg | DELAYED_RELEASE_TABLET | Freq: Every day | ORAL | 0 refills | Status: DC

## 2022-02-17 ENCOUNTER — Telehealth (HOSPITAL_BASED_OUTPATIENT_CLINIC_OR_DEPARTMENT_OTHER): Payer: Self-pay

## 2022-02-17 ENCOUNTER — Telehealth (HOSPITAL_COMMUNITY): Payer: Self-pay | Admitting: Emergency Medicine

## 2022-02-17 DIAGNOSIS — R911 Solitary pulmonary nodule: Secondary | ICD-10-CM

## 2022-02-17 NOTE — Telephone Encounter (Signed)
Attempts were made to contact the patient and the patient's son regarding a need for pulmonology follow-up given his incidental CT imaging findings. A referral was placed for pulmonology follow-up and a message was sent to the patient's PCP, Dr. Arlina Robes [place], and to Pulmonology, Dr. Valeta Harms to establish follow-up given the following CT findings:   CT Chest: IMPRESSION: 1. Irregularly marginated right upper lobe pulmonary nodule with solid and ground-glass components measuring up to 2.2 cm in maximal dimension. Findings are concerning for primary bronchogenic carcinoma. Consider one of the following in 3 months for both low-risk and high-risk individuals: (a) repeat chest CT, (b) follow-up PET-CT, or (c) tissue sampling. This recommendation follows the consensus statement: Guidelines for Management of Incidental Pulmonary Nodules Detected on CT Images: From the Fleischner Society 2017; Radiology 2017; 284:228-243.

## 2022-02-17 NOTE — Telephone Encounter (Signed)
Referral to pulmonology already placed by ED physician

## 2022-02-17 NOTE — Telephone Encounter (Signed)
-----   Message from Raymond J de Guam, MD sent at 02/17/2022 11:45 AM EST ----- Regarding: FW: Lung mass findings Can we please place referral to pulmonology for evaluation of suspicious pulmonary nodule found during recent emergency department visit ----- Message ----- From: Regan Lemming, MD Sent: 02/17/2022   9:17 AM EST To: Garner Nash, DO, Raymond J de [place]am, MD Subject: Lung mass findings                             Good morning,  The following findings were seen on this patient's chest CT during his recent emergency department visit. I am writing to try and get him seen by pulm:  IMPRESSION: 1. Irregularly marginated right upper lobe pulmonary nodule with solid and ground-glass components measuring up to 2.2 cm in maximal dimension. Findings are concerning for primary bronchogenic carcinoma. Consider one of the following in 3 months for both low-risk and high-risk individuals: (a) repeat chest CT, (b) follow-up PET-CT, or (c) tissue sampling. This recommendation follows the consensus statement: Guidelines for Management of Incidental Pulmonary Nodules Detected on CT Images: From the Fleischner Society 2017; Radiology 2017; 284:228-243. 2. Remote healed fractures of the left seventh, eighth, and ninth ribs. No acute rib fracture is seen. Known right twelfth rib fracture is not included within the field of view. 3. No acute fracture or traumatic listhesis of the thoracic spine.  Regan Lemming

## 2022-02-18 NOTE — Telephone Encounter (Signed)
Patient has been scheduled.   Please follow up next week and ensure they he knows about his appt.   Thanks  [place], DO [facility] 02/18/2022 4:36 PM

## 2022-02-22 ENCOUNTER — Encounter (HOSPITAL_BASED_OUTPATIENT_CLINIC_OR_DEPARTMENT_OTHER): Payer: Self-pay | Admitting: Family Medicine

## 2022-02-22 ENCOUNTER — Other Ambulatory Visit: Payer: Self-pay

## 2022-02-22 ENCOUNTER — Ambulatory Visit (INDEPENDENT_AMBULATORY_CARE_PROVIDER_SITE_OTHER): Payer: Medicare HMO | Admitting: Family Medicine

## 2022-02-22 VITALS — BP 158/90 | HR 78 | Ht 73.0 in | Wt 203.0 lb

## 2022-02-22 DIAGNOSIS — R911 Solitary pulmonary nodule: Secondary | ICD-10-CM

## 2022-02-22 DIAGNOSIS — W19XXXA Unspecified fall, initial encounter: Secondary | ICD-10-CM

## 2022-02-22 DIAGNOSIS — S32009A Unspecified fracture of unspecified lumbar vertebra, initial encounter for closed fracture: Secondary | ICD-10-CM | POA: Diagnosis not present

## 2022-02-22 MED ORDER — PANTOPRAZOLE SODIUM 40 MG PO TBEC: 40.0000 mg | DELAYED_RELEASE_TABLET | Freq: Every day | ORAL | 0 refills | Status: AC

## 2022-02-22 NOTE — Progress Notes (Signed)
° ° °  Procedures performed today:    None.  Independent interpretation of notes and tests performed by another provider:   None.  Brief History, Exam, Impression, and Recommendations:    BP (!) 158/90    Pulse 78    Ht 6\' 1"  (1.854 m)    Wt 203 lb (92.1 kg)    SpO2 98%    BMI 26.78 kg/m   Lumbar transverse process fracture, closed, initial encounter ([place]) Imaging in the emergency department revealed transverse process fractures of lumbar vertebrae L1-L3.  Patient was advised on following up with neurosurgeon, although he seemed unsure of that today.  While patient was in the office, we did reach out to [facility] to ensure patient had appointment scheduled for further evaluation/ER follow-up.  Patient was provided with appointment information  Falls Patient had recent falls which led to emergency department evaluation about 1 week ago.  Patient decays he thinks he tripped, also feels that he might of had some dizziness at the time.  Patient is at higher fall risk due to chronic medical conditions, neurocognitive disorder.  He is currently living in [place] with assistance.  In the emergency department, imaging was completed which did show some acute injuries including lumbar transverse process fracture, 12th rib fracture.  He was stable at discharge from the emergency department.  He is also found to have pulmonary nodule, he has evaluation scheduled with pulmonologist later this week. Currently he feels that he is doing well, does have some low back pain. Patient has been referred to physical therapy previously.  He continues with assistance at home through his living facility.  Pulmonary nodule Patient found to have suspicious pulmonary nodule on recent imaging during evaluation after fall He has been arranged for evaluation with pulmonologist later this week.  He was provided with appointment information today and discussed need for importance of  evaluation  Plan for follow-up in about 3 to 4 weeks or sooner as needed   ___________________________________________ Cristan Scherzer de [place], MD, ABFM, [facility] [place]

## 2022-02-22 NOTE — Patient Instructions (Signed)
°  Medication Instructions:  Your physician recommends that you continue on your current medications as directed. Please refer to the Current Medication list given to you today. --If you need a refill on any your medications before your next appointment, please call your pharmacy first. If no refills are authorized on file call the office.-- Follow-Up: Your next appointment:   Your physician recommends that you schedule a follow-up appointment in: 62 WEEKS with Dr. de [place]am  You will receive a text message or e-mail with a link to a survey about your care and experience with [place] today! We would greatly appreciate your feedback!   Thanks for letting us be apart of your health journey!!  Primary Care and Sports Medicine   Dr. Arlina Robes [place]   We encourage you to activate your patient portal called "MyChart".  Sign up information is provided on this After Visit Summary.  MyChart is used to connect with patients for Virtual Visits (Telemedicine).  Patients are able to view lab/test results, encounter notes, upcoming appointments, etc.  Non-urgent messages can be sent to your provider as well. To learn more about what you can do with MyChart, please visit --  [email or web address].

## 2022-02-22 NOTE — Assessment & Plan Note (Addendum)
Patient had recent falls which led to emergency department evaluation about 1 week ago.  Patient decays he thinks he tripped, also feels that he might of had some dizziness at the time.  Patient is at higher fall risk due to chronic medical conditions, neurocognitive disorder.  He is currently living in [place] with assistance.  In the emergency department, imaging was completed which did show some acute injuries including lumbar transverse process fracture, 12th rib fracture.  He was stable at discharge from the emergency department.  He is also found to have pulmonary nodule, he has evaluation scheduled with pulmonologist later this week. Currently he feels that he is doing well, does have some low back pain. Patient has been referred to physical therapy previously.  He continues with assistance at home through his living facility.

## 2022-02-22 NOTE — Assessment & Plan Note (Signed)
Imaging in the emergency department revealed transverse process fractures of lumbar vertebrae L1-L3.  Patient was advised on following up with neurosurgeon, although he seemed unsure of that today.  While patient was in the office, we did reach out to [facility] to ensure patient had appointment scheduled for further evaluation/ER follow-up.  Patient was provided with appointment information

## 2022-02-22 NOTE — Assessment & Plan Note (Signed)
Patient found to have suspicious pulmonary nodule on recent imaging during evaluation after fall He has been arranged for evaluation with pulmonologist later this week.  He was provided with appointment information today and discussed need for importance of evaluation

## 2022-02-28 ENCOUNTER — Telehealth: Payer: Self-pay | Admitting: Pulmonary Disease

## 2022-02-28 NOTE — Telephone Encounter (Signed)
Lm x1 for Gene.  ?

## 2022-03-02 ENCOUNTER — Institutional Professional Consult (permissible substitution): Payer: Medicare HMO | Admitting: Pulmonary Disease

## 2022-03-02 NOTE — Telephone Encounter (Signed)
Spoke with patient's brother Gene to let him know that the appointment was for a lung nodule that was seen on CT scan in the emergency room. He states that patient lives here in a facility and that he lives 500 miles away and that the facility he lives at does not have transportation on Tuesdays and Thursdays. I expressed understanding and told him that we can get him rescheduled for a day that works better. Patient has been rescheduled for next week and brother states that he will need to be on speaker phone during visit as there is no family that lives here that can come with patient. Nothing further needed at this time.  ?

## 2022-03-03 ENCOUNTER — Institutional Professional Consult (permissible substitution): Payer: Medicare HMO | Admitting: Pulmonary Disease

## 2022-03-11 ENCOUNTER — Institutional Professional Consult (permissible substitution): Payer: Medicare HMO | Admitting: Pulmonary Disease

## 2022-03-15 ENCOUNTER — Emergency Department (HOSPITAL_BASED_OUTPATIENT_CLINIC_OR_DEPARTMENT_OTHER)
Admission: EM | Admit: 2022-03-15 | Discharge: 2022-03-15 | Disposition: A | Discharge status: Home / Self Care | Payer: Medicare HMO | Attending: Emergency Medicine | Admitting: Emergency Medicine

## 2022-03-15 ENCOUNTER — Emergency Department (HOSPITAL_BASED_OUTPATIENT_CLINIC_OR_DEPARTMENT_OTHER): Payer: Medicare HMO

## 2022-03-15 ENCOUNTER — Other Ambulatory Visit: Payer: Self-pay

## 2022-03-15 DIAGNOSIS — E119 Type 2 diabetes mellitus without complications: Secondary | ICD-10-CM | POA: Diagnosis not present

## 2022-03-15 DIAGNOSIS — S59902A Unspecified injury of left elbow, initial encounter: Secondary | ICD-10-CM | POA: Diagnosis present

## 2022-03-15 DIAGNOSIS — G20 Parkinson's disease: Secondary | ICD-10-CM | POA: Diagnosis not present

## 2022-03-15 DIAGNOSIS — W010XXA Fall on same level from slipping, tripping and stumbling without subsequent striking against object, initial encounter: Secondary | ICD-10-CM | POA: Diagnosis not present

## 2022-03-15 DIAGNOSIS — W19XXXA Unspecified fall, initial encounter: Secondary | ICD-10-CM

## 2022-03-15 NOTE — ED Provider Notes (Signed)
?[facility] EM[facility] DEPT ?Provider Note ? ? ?CSN: [id number] ?Arrival date & time: 03/15/22  1102 ? ?  ? ?History ? ?Chief Complaint  ?Patient presents with  ? Fall  ? Arm Injury  ?  Left Elbow  ? ? ?Brandon Vargas is a 66 y.o. 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  Patient states that he tripped and landed on his left elbow.  Since then he has had pain described as soreness to his elbow wanted to make sure that it was not broken.  He denies numbness, tingling, head injury or loss of consciousness.  No treatment prior to arrival.  He has no other complaints. ? ?Per chart review, patient has been seen here numerous times for falls related to his balance and gait problems.  He lives at [place] and his most recent visit was 02/15/2022, 1 month ago. ? ? ?Fall ? ?Arm Injury ? ?  ? ?Home Medications ?Prior to Admission medications   ?Medication Sig Start Date End Date Taking? Authorizing Provider  ?ACCU-CHEK GUIDE test strip  05/15/20   [provider]  ?acetaminophen (TYLENOL) 325 MG tablet Take 650 mg by mouth every 6 (six) hours as needed.    [provider]  ?calcium carbonate (TUMS - DOSED IN MG ELEMENTAL CALCIUM) 500 MG chewable tablet Chew 2 tablets by mouth daily as needed for indigestion or heartburn.    [provider]  ?carbidopa-levodopa (SINEMET IR) 25-100 MG tablet Take 1 tablet by mouth 3 (three) times daily. 12/14/21   Ludwig Clarks, DO  ?Cholecalciferol (VITAMIN D3) 25 MCG (1000 UT) CAPS Take 1 capsule (1,000 Units total) by mouth daily. 12/03/21   de [place], Raymond J, MD  ?cyclobenzaprine (FLEXERIL) 5 MG tablet Take 1 tablet (5 mg total) by mouth at bedtime as needed for muscle spasms. 02/01/22   de [place], Raymond J, MD  ?donepezil (ARICEPT) 10 MG tablet Take 1 tablet (10 mg total) by mouth daily. 12/06/21   de [place], Raymond J, MD  ?gabapentin  (NEURONTIN) 300 MG capsule Take 300 mg by mouth 3 (three) times daily.    [provider]  ?glipiZIDE (GLUCOTROL XL) 10 MG 24 hr tablet Take 1 tablet (10 mg total) by mouth daily. 02/01/22   de [place], Raymond J, MD  ?mirtazapine (REMERON) 7.5 MG tablet Take 7.5 mg by mouth at bedtime. 09/02/21   [provider]  ?oxyCODONE-acetaminophen (PERCOCET/ROXICET) 5-325 MG tablet Take 1 tablet by mouth every 6 (six) hours as needed for severe pain. 02/15/22   Regan Lemming, MD  ?pantoprazole (PROTONIX) 40 MG tablet Take 1 tablet (40 mg total) by mouth daily. 02/22/22   de [place], Raymond J, MD  ?tamsulosin (FLOMAX) 0.4 MG CAPS capsule Take 0.4 mg by mouth daily.    [provider]  ?   ? ?Allergies    ?Patient has no known allergies.   ? ?Review of Systems   ?Review of Systems ? ?Physical Exam ?Updated Vital Signs ?BP (!) 119/108 (BP Location: Right Arm)   Pulse 62   Temp 97.8 ?F (36.6 ?C)   Resp 16   SpO2 100%  ?Physical Exam ?Vitals and nursing note reviewed.  ?Constitutional:   ?   General: He is not in acute distress. ?   Appearance: He is not ill-appearing.  ?HENT:  ?   Head: Atraumatic.  ?Eyes:  ?   Conjunctiva/sclera: Conjunctivae  normal.  ?Cardiovascular:  ?   Rate and Rhythm: Normal rate and regular rhythm.  ?   Pulses: Normal pulses.  ?   Heart sounds: No murmur heard. ?Pulmonary:  ?   Effort: Pulmonary effort is normal. No respiratory distress.  ?   Breath sounds: Normal breath sounds.  ?Abdominal:  ?   General: Abdomen is flat. There is no distension.  ?   Palpations: Abdomen is soft.  ?   Tenderness: There is no abdominal tenderness.  ?Musculoskeletal:     ?   General: Normal range of motion.  ?   Cervical back: Normal range of motion.  ?   Comments: Left elbow with some redness and mild swelling.  Mildly tender to palpation.  Full range of motion on elbow flexion and extension.  2+ radial pulses  ?Skin: ?   General: Skin is warm and dry.  ?   Capillary Refill: Capillary refill takes less  than 2 seconds.  ?Neurological:  ?   General: No focal deficit present.  ?   Mental Status: He is alert.  ?Psychiatric:     ?   Mood and Affect: Mood normal.  ?   Comments: Patient has blunted affect  ? ? ?ED Results / Procedures / Treatments   ?Labs ?(all labs ordered are listed, but only abnormal results are displayed) ?Labs Reviewed - No data to display ? ?EKG ?None ? ?Radiology ?DG Elbow Complete Left ? ?Result Date: 03/15/2022 ?CLINICAL DATA:  Fall left elbow EXAM: LEFT ELBOW - COMPLETE 4 VIEW COMPARISON:  None. FINDINGS: There is no evidence of fracture, dislocation, or joint effusion. There is no evidence of arthropathy or other focal bone abnormality. Soft tissues are unremarkable. IMPRESSION: No acute osseous abnormality. Electronically Signed   By: Yetta Glassman M.D.   On: 03/15/2022 12:36   ? ?Procedures ?Procedures  ? ? ?Medications Ordered in ED ?Medications - No data to display ? ?ED Course/ Medical Decision Making/ A&P ?  ?                        ?Medical Decision Making ?Amount and/or Complexity of Data Reviewed ?Radiology: ordered. ? ? ?History:  ?Per HPI ?Social determinants of health: Assisted living ? ?Initial impression: ? ?This patient presents to the ED for concern of elbow injury due to a fall, this involves an extensive number of treatment options, and is a complaint that carries with it a high risk of complications and morbidity.    ?Patient is lying comfortably on bed, no acute distress.  Elbow has some redness and mild swelling, although low suspicion for fracture.  Will obtain x-ray out of abundance of caution.  Patient declines pain medication at this time. ? ?Imaging Studies ordered: ? ?I ordered imaging studies including  ?X-ray of the left elbow which is negative for acute fracture ?I independently visualized and interpreted imaging and I agree with the radiologist interpretation.  ? ?Disposition: ? ?After consideration of the diagnostic results, physical exam, history and the  patients response to treatment feel that the patent would benefit from discharge.   ?Elbow injury ?Fall, initial encounter: Imaging discussed with patient at bedside.  Supportive care measures discussed including OTC medication, icing.  I offered patient option of sling which he would prefer to have at home to help with his pain.  All questions were asked and answered he was discharged home in good condition. ? ? ?Final Clinical Impression(s) / ED Diagnoses ?Final diagnoses:  ?  Injury of left elbow, initial encounter  ?Fall, initial encounter  ? ? ?Rx / DC Orders ?ED Discharge Orders   ? ? None  ? ?  ? ? ?  ?Tonye Pearson, [place] ?03/15/22 1259 ? ?  ?Regan Lemming, MD ?03/15/22 1909 ? ?

## 2022-03-15 NOTE — Discharge Instructions (Addendum)
Your x-ray today was negative for fracture.  It is likely that you had just suffered some bruising from the fall and your pain which should resolve over the next few days.  You can take Tylenol or Motrin as needed for your pain relief.  Please ice the area for approximately 15 to 20 minutes at a time several times a day for the next few days.  If your symptoms do not improve, please follow-up with your primary care doctor. ?

## 2022-03-15 NOTE — ED Triage Notes (Addendum)
Patient arrives with complaints of left elbow pain after a fall yesterday. He is here to make sure it's not broken. Rates pain a 0/10 when not using it, but pain increases with movement. Patient using cell phone while in triage with both extremities.  ?

## 2022-03-15 NOTE — ED Notes (Signed)
EMT-P provided AVS using Teachback Method. Patient verbalizes understanding of Discharge Instructions. Opportunity for Questioning and Answers were provided by EMT-P. Patient Discharged from ED.  ? ?

## 2022-03-21 ENCOUNTER — Other Ambulatory Visit: Payer: Self-pay

## 2022-03-21 ENCOUNTER — Ambulatory Visit (INDEPENDENT_AMBULATORY_CARE_PROVIDER_SITE_OTHER): Payer: Medicare HMO | Admitting: Pulmonary Disease

## 2022-03-21 ENCOUNTER — Encounter: Payer: Self-pay | Admitting: Pulmonary Disease

## 2022-03-21 VITALS — BP 126/62 | HR 71 | Temp 98.3°F | Ht 73.0 in | Wt 203.6 lb

## 2022-03-21 DIAGNOSIS — Z789 Other specified health status: Secondary | ICD-10-CM

## 2022-03-21 DIAGNOSIS — Z801 Family history of malignant neoplasm of trachea, bronchus and lung: Secondary | ICD-10-CM | POA: Diagnosis not present

## 2022-03-21 DIAGNOSIS — R911 Solitary pulmonary nodule: Secondary | ICD-10-CM | POA: Diagnosis not present

## 2022-03-21 DIAGNOSIS — R918 Other nonspecific abnormal finding of lung field: Secondary | ICD-10-CM

## 2022-03-21 NOTE — Progress Notes (Signed)
? ?Synopsis: Referred in March 2023 for lung nodule by de [place], Blondell Reveal, MD ? ?Subjective:  ? ?PATIENT ID: Brandon Vargas GENDER: male DOB: [birth year], MRN: [id number] ? ?Chief Complaint  ?Patient presents with  ? Consult  ?  Patient is here to talk about lung nodule   ? ? ?PMH DMII, parkinsons disease, GERD. Mother with lung cancer. Non cigarette smoker. Smoked THC.  Patient was recently seen in the emergency department had a CT scan of the chest which revealed a new incidental 2.2 x 2.1 x 2.1 groundglass subsolid lesion with irregular margins.  This was concern for a malignancy and the patient was referred for evaluation.  The patient does have Parkinson disease currently managed by Dr. Carles Collet and neurology. ? ? ?Past Medical History:  ?Diagnosis Date  ? Anxiety   ? Arthritis   ? Depression   ? Diabetes mellitus without complication ([place])   ? GERD (gastroesophageal reflux disease)   ? Insomnia   ? Pre-diabetes   ? Wears glasses   ?  ? ?Family History  ?Problem Relation Age of Onset  ? Lung cancer Mother   ? Colon cancer Father 56  ? Diabetes Father   ? Cancer - Colon Father   ? Colon polyps Father   ? Diabetes Sister   ? Diabetes Brother   ? Lung cancer Brother   ? Rectal cancer Neg Hx   ? Stomach cancer Neg Hx   ?  ? ?Past Surgical History:  ?Procedure Laterality Date  ? CERVICAL LAMINOPLASTY  02/27/2020  ? COLONOSCOPY  2016  ? KNEE ARTHROSCOPY W/ MENISCAL REPAIR Left   ? RADIOLOGY WITH ANESTHESIA N/A 10/03/2019  ? Procedure: MRI WITH ANESTHESIA BRAIN AND CERVICAL SPINE WITHOUT CONTRAST;  Surgeon: Radiologist, Medication, MD;  Location: [facility];  Service: Radiology;  Laterality: N/A;  ? ROTATOR CUFF REPAIR    ? VASECTOMY    ? ? ?Social History  ? ?Socioeconomic History  ? Marital status: Divorced  ?  Spouse name: Not on file  ? Number of children: 2  ? Years of education: Not on file  ? Highest education level: Not on file  ?Occupational History  ? Occupation: [employer] time  ?Tobacco Use  ? Smoking status:  Never  ?  Passive exposure: Never  ? Smokeless tobacco: Never  ?Vaping Use  ? Vaping Use: Never used  ?Substance and Sexual Activity  ? Alcohol use: Yes  ?  Comment: 1-2 beer a few times per month  ? Drug use: Not Currently  ?  Types: Marijuana  ? Sexual activity: Not Currently  ?Other Topics Concern  ? Not on file  ?Social History Narrative  ? Right handed  ? Lives in [place]  ? He is retired from working as a Licensed conveyancer.   ? ?Social Determinants of Health  ? ?Financial Resource Strain: Not on file  ?Food Insecurity: Not on file  ?Transportation Needs: Not on file  ?Physical Activity: Not on file  ?Stress: Not on file  ?Social Connections: Not on file  ?Intimate Partner Violence: Not on file  ?  ? ?No Known Allergies  ? ?Outpatient Medications Prior to Visit  ?Medication Sig Dispense Refill  ? ACCU-CHEK GUIDE test strip     ? acetaminophen (TYLENOL) 325 MG tablet Take 650 mg by mouth every 6 (six) hours as needed.    ? calcium carbonate (TUMS - DOSED IN MG ELEMENTAL CALCIUM) 500 MG chewable tablet Chew 2  tablets by mouth daily as needed for indigestion or heartburn.    ? carbidopa-levodopa (SINEMET IR) 25-100 MG tablet Take 1 tablet by mouth 3 (three) times daily. 270 tablet 0  ? Cholecalciferol (VITAMIN D3) 25 MCG (1000 UT) CAPS Take 1 capsule (1,000 Units total) by mouth daily. 90 capsule 1  ? cyclobenzaprine (FLEXERIL) 5 MG tablet Take 1 tablet (5 mg total) by mouth at bedtime as needed for muscle spasms. 30 tablet 0  ? donepezil (ARICEPT) 10 MG tablet Take 1 tablet (10 mg total) by mouth daily. 90 tablet 1  ? gabapentin (NEURONTIN) 300 MG capsule Take 300 mg by mouth 3 (three) times daily.    ? glipiZIDE (GLUCOTROL XL) 10 MG 24 hr tablet Take 1 tablet (10 mg total) by mouth daily. 90 tablet 0  ? mirtazapine (REMERON) 7.5 MG tablet Take 7.5 mg by mouth at bedtime.    ? oxyCODONE-acetaminophen (PERCOCET/ROXICET) 5-325 MG tablet Take 1 tablet by mouth every 6 (six) hours as needed  for severe pain. 15 tablet 0  ? pantoprazole (PROTONIX) 40 MG tablet Take 1 tablet (40 mg total) by mouth daily. 90 tablet 0  ? tamsulosin (FLOMAX) 0.4 MG CAPS capsule Take 0.4 mg by mouth daily.    ? ?No facility-administered medications prior to visit.  ? ? ?Review of Systems  ?Constitutional:  Negative for chills, fever, malaise/fatigue and weight loss.  ?HENT:  Negative for hearing loss, sore throat and tinnitus.   ?Eyes:  Negative for blurred vision and double vision.  ?Respiratory:  Negative for cough, hemoptysis, sputum production, shortness of breath, wheezing and stridor.   ?Cardiovascular:  Negative for chest pain, palpitations, orthopnea, leg swelling and PND.  ?Gastrointestinal:  Negative for abdominal pain, constipation, diarrhea, heartburn, nausea and vomiting.  ?Genitourinary:  Negative for dysuria, hematuria and urgency.  ?Musculoskeletal:  Positive for myalgias. Negative for joint pain.  ?Skin:  Negative for itching and rash.  ?Neurological:  Positive for tremors and weakness. Negative for dizziness, tingling and headaches.  ?Endo/Heme/Allergies:  Negative for environmental allergies. Does not bruise/bleed easily.  ?Psychiatric/Behavioral:  Negative for depression. The patient is not nervous/anxious and does not have insomnia.   ?All other systems reviewed and are negative. ? ? ?Objective:  ?Physical Exam ?Vitals reviewed.  ?Constitutional:   ?   General: He is not in acute distress. ?   Appearance: He is well-developed.  ?HENT:  ?   Head: Normocephalic and atraumatic.  ?Eyes:  ?   General: No scleral icterus. ?   Conjunctiva/sclera: Conjunctivae normal.  ?   Pupils: Pupils are equal, round, and reactive to light.  ?Neck:  ?   Vascular: No JVD.  ?   Trachea: No tracheal deviation.  ?Cardiovascular:  ?   Rate and Rhythm: Normal rate and regular rhythm.  ?   Heart sounds: Normal heart sounds. No murmur heard. ?Pulmonary:  ?   Effort: Pulmonary effort is normal. No tachypnea, accessory muscle usage or  respiratory distress.  ?   Breath sounds: Normal breath sounds. No stridor. No wheezing, rhonchi or rales.  ?Abdominal:  ?   General: There is no distension.  ?   Palpations: Abdomen is soft.  ?   Tenderness: There is no abdominal tenderness.  ?Musculoskeletal:     ?   General: No tenderness.  ?   Cervical back: Neck supple.  ?Lymphadenopathy:  ?   Cervical: No cervical adenopathy.  ?Skin: ?   General: Skin is warm and dry.  ?  Capillary Refill: Capillary refill takes less than 2 seconds.  ?   Findings: No rash.  ?Neurological:  ?   Mental Status: He is alert and oriented to person, place, and time.  ?   Motor: Weakness present.  ?   Gait: Gait abnormal.  ?Psychiatric:     ?   Behavior: Behavior normal.  ? ? ? ?Vitals:  ? 03/21/22 0936  ?BP: 126/62  ?Pulse: 71  ?Temp: 98.3 ?F (36.8 ?C)  ?TempSrc: Oral  ?SpO2: 98%  ?Weight: 203 lb 9.6 oz (92.4 kg)  ?Height: 6\' 1"  (1.854 m)  ? ?98% on  RA ?BMI Readings from Last 3 Encounters:  ?03/21/22 26.86 kg/m?  ?02/22/22 26.78 kg/m?  ?02/15/22 26.25 kg/m?  ? ?Wt Readings from Last 3 Encounters:  ?03/21/22 203 lb 9.6 oz (92.4 kg)  ?02/22/22 203 lb (92.1 kg)  ?02/15/22 199 lb (90.3 kg)  ? ? ? ?CBC ?   ?Component Value Date/Time  ? WBC 10.4 02/15/2022 1133  ? RBC 4.67 02/15/2022 1133  ? HGB 13.0 02/15/2022 1133  ? HGB 15.4 09/29/2021 1518  ? HCT 38.8 (L) 02/15/2022 1133  ? HCT 45.3 09/29/2021 1518  ? PLT 198 02/15/2022 1133  ? MCV 83.1 02/15/2022 1133  ? MCV 85 09/29/2021 1518  ? MCH 27.8 02/15/2022 1133  ? MCHC 33.5 02/15/2022 1133  ? RDW 12.9 02/15/2022 1133  ? RDW 12.5 09/29/2021 1518  ? LYMPHSABS 1.6 09/29/2021 1518  ? EOSABS 0.1 09/29/2021 1518  ? BASOSABS 0.0 09/29/2021 1518  ? ? ? ?Chest Imaging: ?February 2023: ?CT chest: ?2.3 x 2.1 cm groundglass opacity within the right upper lobe concerning for malignancy. ?The patient's images have been independently reviewed by me.   ? ?Pulmonary Functions Testing Results: ?   ? View : No data to display.  ?  ?  ?  ? ? ?FeNO:   ? ?Pathology:  ? ?Echocardiogram:  ? ?Heart Catheterization:  ?   ?Assessment & Plan:  ? ?  ICD-10-CM   ?1. Lung nodule  R91.1 CT Super D Chest Wo Contrast  ?  ?2. Ground glass opacity present on imaging of lun

## 2022-03-21 NOTE — Patient Instructions (Signed)
Thank you for visiting Dr. Valeta Harms at [facility]. ?Today we recommend the following: ? ?Orders Placed This Encounter  ?Procedures  ? CT Super D Chest Wo Contrast  ? ?Follow up with me after your ct chest complete.  ? ?Return in about 2 months (around 05/21/2022) for with Eric Form, NP, or Dr. Valeta Harms. ? ? ? ?Please do your part to reduce the spread of COVID-19.  ?

## 2022-03-22 ENCOUNTER — Encounter (HOSPITAL_BASED_OUTPATIENT_CLINIC_OR_DEPARTMENT_OTHER): Payer: Self-pay | Admitting: Emergency Medicine

## 2022-03-22 ENCOUNTER — Ambulatory Visit (HOSPITAL_BASED_OUTPATIENT_CLINIC_OR_DEPARTMENT_OTHER): Payer: Medicare HMO | Admitting: Family Medicine

## 2022-03-22 ENCOUNTER — Emergency Department (HOSPITAL_BASED_OUTPATIENT_CLINIC_OR_DEPARTMENT_OTHER)
Admission: EM | Admit: 2022-03-22 | Discharge: 2022-03-22 | Disposition: A | Discharge status: Home / Self Care | Payer: Medicare HMO | Attending: Emergency Medicine | Admitting: Emergency Medicine

## 2022-03-22 ENCOUNTER — Other Ambulatory Visit: Payer: Self-pay

## 2022-03-22 DIAGNOSIS — W19XXXA Unspecified fall, initial encounter: Secondary | ICD-10-CM | POA: Diagnosis not present

## 2022-03-22 DIAGNOSIS — S0181XA Laceration without foreign body of other part of head, initial encounter: Secondary | ICD-10-CM | POA: Insufficient documentation

## 2022-03-22 DIAGNOSIS — S0993XA Unspecified injury of face, initial encounter: Secondary | ICD-10-CM | POA: Diagnosis present

## 2022-03-22 DIAGNOSIS — G20 Parkinson's disease: Secondary | ICD-10-CM | POA: Diagnosis not present

## 2022-03-22 NOTE — ED Triage Notes (Signed)
Pt via ems from [place] after a mechanical fall this morning. Pt has parkinson's and has frequent falls. Fall was witnessed; denies loc, hitting his head, dizziness. Pt alert & at baseline mental status per ems. NAD noted.  ?

## 2022-03-22 NOTE — ED Notes (Signed)
Patient does not have anyone that can transport him home. [facility] called. ?

## 2022-03-22 NOTE — ED Notes (Signed)
Dc instructions reviewed with patient. Patient voiced understanding. Dc with belongings.  °

## 2022-03-22 NOTE — ED Notes (Signed)
[facility] called for transport 10:19 AOM ?

## 2022-03-22 NOTE — ED Provider Notes (Signed)
?[facility] ?Provider Note ? ? ?CSN: [id number] ?Arrival date & time: 03/22/22  4098 ? ?  ? ?History ? ?Chief Complaint  ?Patient presents with  ? Fall  ? ? ?Brandon Vargas is a 66 y.o. male. ? ?Six 57-year-old male presents had mechanical fall just prior to arrival.  Has history of Parkinson's and states that he was using his walker and lost his balance.  No LOC.  Did sustain an abrasion to the left side of his face.  Denies any trauma to his chest abdomen or pelvis.  No extremity or hip discomfort.  Some bleeding was noted that was controlled direct pressure.  He does not take any blood thinners ? ? ?  ? ?Home Medications ?Prior to Admission medications   ?Medication Sig Start Date End Date Taking? Authorizing Provider  ?ACCU-CHEK GUIDE test strip  05/15/20   [provider]  ?acetaminophen (TYLENOL) 325 MG tablet Take 650 mg by mouth every 6 (six) hours as needed.    [provider]  ?calcium carbonate (TUMS - DOSED IN MG ELEMENTAL CALCIUM) 500 MG chewable tablet Chew 2 tablets by mouth daily as needed for indigestion or heartburn.    [provider]  ?carbidopa-levodopa (SINEMET IR) 25-100 MG tablet Take 1 tablet by mouth 3 (three) times daily. 12/14/21   Ludwig Clarks, DO  ?Cholecalciferol (VITAMIN D3) 25 MCG (1000 UT) CAPS Take 1 capsule (1,000 Units total) by mouth daily. 12/03/21   [place], Raymond J, MD  ?cyclobenzaprine (FLEXERIL) 5 MG tablet Take 1 tablet (5 mg total) by mouth at bedtime as needed for muscle spasms. 02/01/22   [place], Raymond J, MD  ?donepezil (ARICEPT) 10 MG tablet Take 1 tablet (10 mg total) by mouth daily. 12/06/21   [place], Raymond J, MD  ?gabapentin (NEURONTIN) 300 MG capsule Take 300 mg by mouth 3 (three) times daily.    [provider]  ?glipiZIDE (GLUCOTROL XL) 10 MG 24 hr tablet Take 1 tablet (10 mg total) by mouth daily. 02/01/22   [place], Raymond J, MD  ?mirtazapine (REMERON) 7.5 MG tablet Take 7.5 mg by mouth at  bedtime. 09/02/21   [provider]  ?oxyCODONE-acetaminophen (PERCOCET/ROXICET) 5-325 MG tablet Take 1 tablet by mouth every 6 (six) hours as needed for severe pain. 02/15/22   Regan Lemming, MD  ?pantoprazole (PROTONIX) 40 MG tablet Take 1 tablet (40 mg total) by mouth daily. 02/22/22   [place], Raymond J, MD  ?tamsulosin (FLOMAX) 0.4 MG CAPS capsule Take 0.4 mg by mouth daily.    [provider]  ?   ? ?Allergies    ?Patient has no known allergies.   ? ?Review of Systems   ?Review of Systems  ?All other systems reviewed and are negative. ? ?Physical Exam ?Updated Vital Signs ?BP (!) 143/80 (BP Location: Right Arm)   Pulse 67   Temp 98.2 ?F (36.8 ?C) (Oral)   Resp 20   Ht 1.854 m (6\' 1" )   Wt 86.2 kg   SpO2 99%   BMI 25.07 kg/m?  ?Physical Exam ?Vitals and nursing note reviewed.  ?Constitutional:   ?   General: He is not in acute distress. ?   Appearance: Normal appearance. He is well-developed. He is not toxic-appearing.  ?HENT:  ?   Head:  ? ?Eyes:  ?   General: Lids are normal.  ?   Conjunctiva/sclera: Conjunctivae normal.  ?   Pupils: Pupils are equal, round, and reactive  to light.  ?Neck:  ?   Thyroid: No thyroid mass.  ?   Trachea: No tracheal deviation.  ?Cardiovascular:  ?   Rate and Rhythm: Normal rate and regular rhythm.  ?   Heart sounds: Normal heart sounds. No murmur heard. ?  No gallop.  ?Pulmonary:  ?   Effort: Pulmonary effort is normal. No respiratory distress.  ?   Breath sounds: Normal breath sounds. No stridor. No decreased breath sounds, wheezing, rhonchi or rales.  ?Abdominal:  ?   General: There is no distension.  ?   Palpations: Abdomen is soft.  ?   Tenderness: There is no abdominal tenderness. There is no rebound.  ?Musculoskeletal:     ?   General: No tenderness. Normal range of motion.  ?   Cervical back: Normal range of motion and neck supple.  ?Skin: ?   General: Skin is warm and dry.  ?   Findings: No abrasion or rash.  ?Neurological:  ?   Mental Status: He is  alert and oriented to person, place, and time. Mental status is at baseline.  ?   GCS: GCS eye subscore is 4. GCS verbal subscore is 5. GCS motor subscore is 6.  ?   Cranial Nerves: No cranial nerve deficit.  ?   Sensory: No sensory deficit.  ?   Motor: Motor function is intact.  ?Psychiatric:     ?   Attention and Perception: Attention normal.     ?   Speech: Speech normal.     ?   Behavior: Behavior normal.  ? ? ?ED Results / Procedures / Treatments   ?Labs ?(all labs ordered are listed, but only abnormal results are displayed) ?Labs Reviewed - No data to display ? ?EKG ?None ? ?Radiology ?No results found. ? ?Procedures ?Procedures  ? ? ?Medications Ordered in ED ?Medications - No data to display ? ?ED Course/ Medical Decision Making/ A&P ?  ?                        ?Medical Decision Making ? ?Patient does not have any LOC.  He has no signs of trauma to the skull.  Does have abrasion noted to the left side of his face.  These were repaired with Dermabond.  Do not feel that he requires intracranial imaging at this time.  His neurological symptoms at baseline.  Will discharge with return precautions ? ?LACERATION REPAIR ?Performed by: Leota Jacobsen ?Authorized by: Leota Jacobsen ?Consent: Verbal consent obtained. ?Risks and benefits: risks, benefits and alternatives were discussed ?Consent given by: patient ?Patient identity confirmed: provided demographic data ?Prepped and Draped in normal sterile fashion ?Wound explored ? ?Laceration Location: Face ? ?Laceration Length: 1 cm ? ?No Foreign Bodies seen or palpated ? ?Anesthesia: local infiltration ? ? ?Irrigation method: syringe ?Amount of cleaning: standard ? ?Skin closure: Dermabond ? ? ? ?Patient tolerance: Patient tolerated the procedure well with no immediate complications.  ? ? ? ? ? ? ?Final Clinical Impression(s) / ED Diagnoses ?Final diagnoses:  ?None  ? ? ?Rx / DC Orders ?ED Discharge Orders   ? ? None  ? ?  ? ? ?  ?Lacretia Leigh, MD ?03/22/22  1014 ? ?

## 2022-03-22 NOTE — ED Notes (Signed)
[facility] transportation called and will transport patient back to facility. ?

## 2022-03-24 ENCOUNTER — Ambulatory Visit (INDEPENDENT_AMBULATORY_CARE_PROVIDER_SITE_OTHER): Payer: Medicare HMO | Admitting: Family Medicine

## 2022-03-24 ENCOUNTER — Encounter (HOSPITAL_BASED_OUTPATIENT_CLINIC_OR_DEPARTMENT_OTHER): Payer: Self-pay | Admitting: Family Medicine

## 2022-03-24 VITALS — BP 130/72 | HR 86 | Ht 73.0 in | Wt 217.0 lb

## 2022-03-24 DIAGNOSIS — G4723 Circadian rhythm sleep disorder, irregular sleep wake type: Secondary | ICD-10-CM | POA: Diagnosis not present

## 2022-03-24 DIAGNOSIS — G4700 Insomnia, unspecified: Secondary | ICD-10-CM

## 2022-03-24 DIAGNOSIS — E119 Type 2 diabetes mellitus without complications: Secondary | ICD-10-CM

## 2022-03-24 DIAGNOSIS — S32009D Unspecified fracture of unspecified lumbar vertebra, subsequent encounter for fracture with routine healing: Secondary | ICD-10-CM

## 2022-03-24 NOTE — Assessment & Plan Note (Addendum)
Pain has improved since last visit with me, reports that he is doing well overall, no further symptoms related to this.  Was prescribed pain medication through emergency department, has completed prescription and does not needing any further medications for pain control ?

## 2022-03-24 NOTE — Progress Notes (Signed)
? ? ?  Procedures performed today:   ? ?None. ? ?Independent interpretation of notes and tests performed by another provider:  ? ?None. ? ?Brief History, Exam, Impression, and Recommendations:   ? ?BP 130/72   Pulse 86   Ht 6\' 1"  (1.854 m)   Wt 217 lb (98.4 kg)   SpO2 97%   BMI 28.63 kg/m?  ? ?Patient accompanied today by home health nurse she has some concerns regarding medication list and reports that patient has irregular sleep, is restless and awake for most of the night ? ?Diabetes ([place]) ?Patient continues with glipizide XL at 10 mg dose, tolerating well.  Most recent hemoglobin A1c was at goal at 6.9%.  No reported issues of low blood sugar readings. ?Continue with current medication regimen, likely recheck A1c at next visit in about 6 weeks ? ?Lumbar transverse process fracture (HCC) ?Pain has improved since last visit with me, reports that he is doing well overall, no further symptoms related to this.  Was prescribed pain medication through emergency department, has completed prescription and does not needing any further medications for pain control ? ?Irregular sleep-wake rhythm ?Patient is generally restless at night, home health nurse who accompanied patient today indicates that he does not get good rest at night and generally his recent falls have been while getting up from the bed during the night.  He does follow with Dr. 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  Continues with Sinemet and Aricept related to this. 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  He is also following with psychiatry currently.  Feel that he would be well served by further evaluation with Dr. Carles Collet as well as possible evaluation with sleep medicine specialist.  Referral placed today for further evaluation with sleep medicine specialist ? ?Insomnia ?Recommend continue follow-up with current specialist including psychiatry and  neurology ?We will also place referral to sleep medicine specialist for further evaluation and recommendations ? ?Plan for follow-up in about 6 weeks to monitor above ? ? ?___________________________________________ ?Jadesola Poynter de [place], MD, ABFM, CAQSM ?Primary Care and Sports Medicine ?[place] ?

## 2022-03-24 NOTE — Assessment & Plan Note (Signed)
Patient continues with glipizide XL at 10 mg dose, tolerating well.  Most recent hemoglobin A1c was at goal at 6.9%.  No reported issues of low blood sugar readings. ?Continue with current medication regimen, likely recheck A1c at next visit in about 6 weeks ?

## 2022-03-24 NOTE — Patient Instructions (Signed)
?  Medication Instructions:  ?Your physician recommends that you continue on your current medications as directed. Please refer to the Current Medication list given to you today. ?--If you need a refill on any your medications before your next appointment, please call your pharmacy first. If no refills are authorized on file call the office.-- ? ? ?Referrals/Procedures/Imaging: ?Sleep Medicine ? ?Follow-Up: ?Your next appointment:   ?Your physician recommends that you schedule a follow-up appointment in: 6 week follow up back  with Dr. Tennis Must [place] ? ?You will receive a text message or e-mail with a link to a survey about your care and experience with [place] today! We would greatly appreciate your feedback!  ? ?Thanks for letting us be apart of your health journey!!  ?Primary Care and Sports Medicine  ? ?Dr. Kyung Rudd de [place]  ? ?We encourage you to activate your patient portal called "MyChart".  Sign up information is provided on this After Visit Summary.  MyChart is used to connect with patients for Virtual Visits (Telemedicine).  Patients are able to view lab/test results, encounter notes, upcoming appointments, etc.  Non-urgent messages can be sent to your provider as well. To learn more about what you can do with MyChart, please visit --  [email or web address].    ?

## 2022-03-24 NOTE — Assessment & Plan Note (Signed)
Patient is generally restless at night, home health nurse who accompanied patient today indicates that he does not get good rest at night and generally his recent falls have been while getting up from the bed during the night.  He does follow with Dr. 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  Continues with Sinemet and Aricept related to this. 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  He is also following with psychiatry currently.  Feel that he would be well served by further evaluation with Dr. Carles Collet as well as possible evaluation with sleep medicine specialist.  Referral placed today for further evaluation with sleep medicine specialist ?

## 2022-03-24 NOTE — Assessment & Plan Note (Signed)
Recommend continue follow-up with current specialist including psychiatry and neurology ?We will also place referral to sleep medicine specialist for further evaluation and recommendations ?

## 2022-03-28 DIAGNOSIS — R488 Other symbolic dysfunctions: Secondary | ICD-10-CM | POA: Diagnosis not present

## 2022-03-28 DIAGNOSIS — R2681 Unsteadiness on feet: Secondary | ICD-10-CM | POA: Diagnosis not present

## 2022-03-28 DIAGNOSIS — M6281 Muscle weakness (generalized): Secondary | ICD-10-CM | POA: Diagnosis not present

## 2022-03-28 DIAGNOSIS — R2689 Other abnormalities of gait and mobility: Secondary | ICD-10-CM | POA: Diagnosis not present

## 2022-03-28 DIAGNOSIS — R296 Repeated falls: Secondary | ICD-10-CM | POA: Diagnosis not present

## 2022-03-29 DIAGNOSIS — R4789 Other speech disturbances: Secondary | ICD-10-CM | POA: Diagnosis not present

## 2022-03-29 DIAGNOSIS — R2689 Other abnormalities of gait and mobility: Secondary | ICD-10-CM | POA: Diagnosis not present

## 2022-03-29 DIAGNOSIS — R41841 Cognitive communication deficit: Secondary | ICD-10-CM | POA: Diagnosis not present

## 2022-03-29 DIAGNOSIS — R2681 Unsteadiness on feet: Secondary | ICD-10-CM | POA: Diagnosis not present

## 2022-03-29 DIAGNOSIS — R296 Repeated falls: Secondary | ICD-10-CM | POA: Diagnosis not present

## 2022-03-29 DIAGNOSIS — R488 Other symbolic dysfunctions: Secondary | ICD-10-CM | POA: Diagnosis not present

## 2022-03-30 DIAGNOSIS — R488 Other symbolic dysfunctions: Secondary | ICD-10-CM | POA: Diagnosis not present

## 2022-03-30 DIAGNOSIS — R41841 Cognitive communication deficit: Secondary | ICD-10-CM | POA: Diagnosis not present

## 2022-03-30 DIAGNOSIS — R4789 Other speech disturbances: Secondary | ICD-10-CM | POA: Diagnosis not present

## 2022-03-31 ENCOUNTER — Ambulatory Visit: Payer: Medicare HMO | Admitting: Psychology

## 2022-03-31 DIAGNOSIS — R4789 Other speech disturbances: Secondary | ICD-10-CM | POA: Diagnosis not present

## 2022-03-31 DIAGNOSIS — R488 Other symbolic dysfunctions: Secondary | ICD-10-CM | POA: Diagnosis not present

## 2022-03-31 DIAGNOSIS — M6281 Muscle weakness (generalized): Secondary | ICD-10-CM | POA: Diagnosis not present

## 2022-03-31 DIAGNOSIS — R41841 Cognitive communication deficit: Secondary | ICD-10-CM | POA: Diagnosis not present

## 2022-04-01 DIAGNOSIS — R488 Other symbolic dysfunctions: Secondary | ICD-10-CM | POA: Diagnosis not present

## 2022-04-01 DIAGNOSIS — M6281 Muscle weakness (generalized): Secondary | ICD-10-CM | POA: Diagnosis not present

## 2022-04-03 ENCOUNTER — Other Ambulatory Visit (HOSPITAL_BASED_OUTPATIENT_CLINIC_OR_DEPARTMENT_OTHER): Payer: Self-pay

## 2022-04-04 ENCOUNTER — Ambulatory Visit (HOSPITAL_BASED_OUTPATIENT_CLINIC_OR_DEPARTMENT_OTHER): Payer: Medicare HMO | Admitting: Family Medicine

## 2022-04-04 DIAGNOSIS — M6281 Muscle weakness (generalized): Secondary | ICD-10-CM | POA: Diagnosis not present

## 2022-04-04 DIAGNOSIS — R2689 Other abnormalities of gait and mobility: Secondary | ICD-10-CM | POA: Diagnosis not present

## 2022-04-04 DIAGNOSIS — R296 Repeated falls: Secondary | ICD-10-CM | POA: Diagnosis not present

## 2022-04-04 DIAGNOSIS — R2681 Unsteadiness on feet: Secondary | ICD-10-CM | POA: Diagnosis not present

## 2022-04-04 DIAGNOSIS — R488 Other symbolic dysfunctions: Secondary | ICD-10-CM | POA: Diagnosis not present

## 2022-04-05 DIAGNOSIS — R41841 Cognitive communication deficit: Secondary | ICD-10-CM | POA: Diagnosis not present

## 2022-04-05 DIAGNOSIS — R4789 Other speech disturbances: Secondary | ICD-10-CM | POA: Diagnosis not present

## 2022-04-05 DIAGNOSIS — M6281 Muscle weakness (generalized): Secondary | ICD-10-CM | POA: Diagnosis not present

## 2022-04-05 DIAGNOSIS — R488 Other symbolic dysfunctions: Secondary | ICD-10-CM | POA: Diagnosis not present

## 2022-04-06 DIAGNOSIS — R2681 Unsteadiness on feet: Secondary | ICD-10-CM | POA: Diagnosis not present

## 2022-04-06 DIAGNOSIS — R2689 Other abnormalities of gait and mobility: Secondary | ICD-10-CM | POA: Diagnosis not present

## 2022-04-06 DIAGNOSIS — R296 Repeated falls: Secondary | ICD-10-CM | POA: Diagnosis not present

## 2022-04-06 NOTE — Telephone Encounter (Signed)
Will close encounter since there is no documentation.  ?

## 2022-04-07 DIAGNOSIS — R488 Other symbolic dysfunctions: Secondary | ICD-10-CM | POA: Diagnosis not present

## 2022-04-07 DIAGNOSIS — R41841 Cognitive communication deficit: Secondary | ICD-10-CM | POA: Diagnosis not present

## 2022-04-07 DIAGNOSIS — R4789 Other speech disturbances: Secondary | ICD-10-CM | POA: Diagnosis not present

## 2022-04-08 DIAGNOSIS — R4789 Other speech disturbances: Secondary | ICD-10-CM | POA: Diagnosis not present

## 2022-04-08 DIAGNOSIS — R41841 Cognitive communication deficit: Secondary | ICD-10-CM | POA: Diagnosis not present

## 2022-04-08 DIAGNOSIS — M6281 Muscle weakness (generalized): Secondary | ICD-10-CM | POA: Diagnosis not present

## 2022-04-08 DIAGNOSIS — R488 Other symbolic dysfunctions: Secondary | ICD-10-CM | POA: Diagnosis not present

## 2022-04-11 DIAGNOSIS — R4789 Other speech disturbances: Secondary | ICD-10-CM | POA: Diagnosis not present

## 2022-04-11 DIAGNOSIS — R488 Other symbolic dysfunctions: Secondary | ICD-10-CM | POA: Diagnosis not present

## 2022-04-11 DIAGNOSIS — R2681 Unsteadiness on feet: Secondary | ICD-10-CM | POA: Diagnosis not present

## 2022-04-11 DIAGNOSIS — R296 Repeated falls: Secondary | ICD-10-CM | POA: Diagnosis not present

## 2022-04-11 DIAGNOSIS — R2689 Other abnormalities of gait and mobility: Secondary | ICD-10-CM | POA: Diagnosis not present

## 2022-04-11 DIAGNOSIS — R41841 Cognitive communication deficit: Secondary | ICD-10-CM | POA: Diagnosis not present

## 2022-04-12 DIAGNOSIS — R2681 Unsteadiness on feet: Secondary | ICD-10-CM | POA: Diagnosis not present

## 2022-04-12 DIAGNOSIS — R2689 Other abnormalities of gait and mobility: Secondary | ICD-10-CM | POA: Diagnosis not present

## 2022-04-12 DIAGNOSIS — R488 Other symbolic dysfunctions: Secondary | ICD-10-CM | POA: Diagnosis not present

## 2022-04-12 DIAGNOSIS — R41841 Cognitive communication deficit: Secondary | ICD-10-CM | POA: Diagnosis not present

## 2022-04-12 DIAGNOSIS — R296 Repeated falls: Secondary | ICD-10-CM | POA: Diagnosis not present

## 2022-04-12 DIAGNOSIS — M6281 Muscle weakness (generalized): Secondary | ICD-10-CM | POA: Diagnosis not present

## 2022-04-12 DIAGNOSIS — R4789 Other speech disturbances: Secondary | ICD-10-CM | POA: Diagnosis not present

## 2022-04-13 DIAGNOSIS — M6281 Muscle weakness (generalized): Secondary | ICD-10-CM | POA: Diagnosis not present

## 2022-04-13 DIAGNOSIS — R488 Other symbolic dysfunctions: Secondary | ICD-10-CM | POA: Diagnosis not present

## 2022-04-14 DIAGNOSIS — M6281 Muscle weakness (generalized): Secondary | ICD-10-CM | POA: Diagnosis not present

## 2022-04-14 DIAGNOSIS — R488 Other symbolic dysfunctions: Secondary | ICD-10-CM | POA: Diagnosis not present

## 2022-04-15 DIAGNOSIS — R2689 Other abnormalities of gait and mobility: Secondary | ICD-10-CM | POA: Diagnosis not present

## 2022-04-15 DIAGNOSIS — R2681 Unsteadiness on feet: Secondary | ICD-10-CM | POA: Diagnosis not present

## 2022-04-15 DIAGNOSIS — R296 Repeated falls: Secondary | ICD-10-CM | POA: Diagnosis not present

## 2022-04-18 DIAGNOSIS — R2681 Unsteadiness on feet: Secondary | ICD-10-CM | POA: Diagnosis not present

## 2022-04-18 DIAGNOSIS — R296 Repeated falls: Secondary | ICD-10-CM | POA: Diagnosis not present

## 2022-04-18 DIAGNOSIS — R2689 Other abnormalities of gait and mobility: Secondary | ICD-10-CM | POA: Diagnosis not present

## 2022-04-19 DIAGNOSIS — R4789 Other speech disturbances: Secondary | ICD-10-CM | POA: Diagnosis not present

## 2022-04-19 DIAGNOSIS — R2689 Other abnormalities of gait and mobility: Secondary | ICD-10-CM | POA: Diagnosis not present

## 2022-04-19 DIAGNOSIS — M6281 Muscle weakness (generalized): Secondary | ICD-10-CM | POA: Diagnosis not present

## 2022-04-19 DIAGNOSIS — R2681 Unsteadiness on feet: Secondary | ICD-10-CM | POA: Diagnosis not present

## 2022-04-19 DIAGNOSIS — R488 Other symbolic dysfunctions: Secondary | ICD-10-CM | POA: Diagnosis not present

## 2022-04-19 DIAGNOSIS — R41841 Cognitive communication deficit: Secondary | ICD-10-CM | POA: Diagnosis not present

## 2022-04-19 DIAGNOSIS — R296 Repeated falls: Secondary | ICD-10-CM | POA: Diagnosis not present

## 2022-04-20 DIAGNOSIS — R488 Other symbolic dysfunctions: Secondary | ICD-10-CM | POA: Diagnosis not present

## 2022-04-20 DIAGNOSIS — M6281 Muscle weakness (generalized): Secondary | ICD-10-CM | POA: Diagnosis not present

## 2022-04-20 DIAGNOSIS — R4789 Other speech disturbances: Secondary | ICD-10-CM | POA: Diagnosis not present

## 2022-04-20 DIAGNOSIS — R41841 Cognitive communication deficit: Secondary | ICD-10-CM | POA: Diagnosis not present

## 2022-04-21 DIAGNOSIS — M6281 Muscle weakness (generalized): Secondary | ICD-10-CM | POA: Diagnosis not present

## 2022-04-21 DIAGNOSIS — R488 Other symbolic dysfunctions: Secondary | ICD-10-CM | POA: Diagnosis not present

## 2022-04-22 DIAGNOSIS — R41841 Cognitive communication deficit: Secondary | ICD-10-CM | POA: Diagnosis not present

## 2022-04-22 DIAGNOSIS — R488 Other symbolic dysfunctions: Secondary | ICD-10-CM | POA: Diagnosis not present

## 2022-04-22 DIAGNOSIS — R4789 Other speech disturbances: Secondary | ICD-10-CM | POA: Diagnosis not present

## 2022-04-25 DIAGNOSIS — R2689 Other abnormalities of gait and mobility: Secondary | ICD-10-CM | POA: Diagnosis not present

## 2022-04-25 DIAGNOSIS — R296 Repeated falls: Secondary | ICD-10-CM | POA: Diagnosis not present

## 2022-04-25 DIAGNOSIS — R2681 Unsteadiness on feet: Secondary | ICD-10-CM | POA: Diagnosis not present

## 2022-04-25 DIAGNOSIS — R488 Other symbolic dysfunctions: Secondary | ICD-10-CM | POA: Diagnosis not present

## 2022-04-25 DIAGNOSIS — M6281 Muscle weakness (generalized): Secondary | ICD-10-CM | POA: Diagnosis not present

## 2022-04-26 DIAGNOSIS — R488 Other symbolic dysfunctions: Secondary | ICD-10-CM | POA: Diagnosis not present

## 2022-04-26 DIAGNOSIS — R2689 Other abnormalities of gait and mobility: Secondary | ICD-10-CM | POA: Diagnosis not present

## 2022-04-26 DIAGNOSIS — R2681 Unsteadiness on feet: Secondary | ICD-10-CM | POA: Diagnosis not present

## 2022-04-26 DIAGNOSIS — M6281 Muscle weakness (generalized): Secondary | ICD-10-CM | POA: Diagnosis not present

## 2022-04-26 DIAGNOSIS — R296 Repeated falls: Secondary | ICD-10-CM | POA: Diagnosis not present

## 2022-04-26 NOTE — Progress Notes (Signed)
? ? ?Assessment/Plan:  ? ? ? PSP ?            -Patient in physical therapy currently at [facility] but is almost completed ?            -Patient has had multiple falls, which is one of the hallmarks of PSP.  Discussed with patient that I really do not want him walking at all, unless directly supervised. ? -MBE in November, 2022 was normal. ? -d/c levodopa.  Don't think even higher dosages will be of value. ? -Needs higher level of care.  This was discussed last visit as well.  We will have my social worker try to contact his POA, which is his brother.  My social worker did meet with the patient as well today. ?  ?  ?2.  Eyelid opening apraxia ?            -This is another one of the "red flags" that can be associated with the atypical state.  Discussed nature and pathophysiology.  Discussed the value of Botox.  He is not interested in that. ?  ?  ?3.  Dementia ?            -As above, needs higher level of care.  Mostly concerned about his physical state in living alone. ?    ? ?4.  Lung nodule ? -Following with Dr. Valeta Harms.  Imaging was concerning for potential malignancy.  They are going to repeat imaging in 3 months from March, 2023. ? ?5.  Insomnia ? -pcp has consult pending with dr. Annamaria Boots ? ?Subjective:  ? ?Brandon Vargas was seen today in follow up for PSP, dx last visit.  My previous records were reviewed prior to todays visit as well as outside records available to me.  No one accompanies the patient today.  Dropped off by [employer].    Patient has been in the emergency room multiple times since our last visit with falls.  He was in the emergency room January, February, and twice in March with falls, the most recent requires Dermabond repair to the left side of the face.  Pt states that he falls to the left.  He does not always have a walker when he falls, but sometimes does.  Last visit, we decided to trial levodopa, although I discussed with them I was not convinced it would really help.   Unfortunately, it has not.  Pt states that he takes it at 11am, 1pm, 3:30pm.   He states that meds are distributed to him.  He did have a swallow study November 11, 2021.  This was unremarkable.  Patient has seen Dr. Valeta Harms since last visit for lung nodule with irregular margins concerning for potential malignancy.  They are going to repeat imaging in 3 months.  Patient's primary care physician has put in consult for Dr. Annamaria Boots regarding insomnia.  Last visit, we discussed that patient needs higher level of care.  He was living independently at [facility] last visit.  That remains the living situation. ? ? ? ?ALLERGIES:  No Known Allergies ? ?CURRENT MEDICATIONS:  ?Outpatient Encounter Medications as of 04/28/2022  ?Medication Sig  ? ACCU-CHEK GUIDE test strip   ? acetaminophen (TYLENOL) 325 MG tablet Take 650 mg by mouth every 6 (six) hours as needed.  ? AMBULATORY NON FORMULARY MEDICATION DISCONTINUE CARBIDOPA LEVODOPA  ? calcium carbonate (TUMS - DOSED IN MG ELEMENTAL CALCIUM) 500 MG chewable tablet Chew 2 tablets by mouth daily  as needed for indigestion or heartburn.  ? Cholecalciferol (VITAMIN D3) 25 MCG (1000 UT) CAPS Take 1 capsule (1,000 Units total) by mouth daily.  ? cyclobenzaprine (FLEXERIL) 5 MG tablet Take 1 tablet (5 mg total) by mouth at bedtime as needed for muscle spasms.  ? donepezil (ARICEPT) 10 MG tablet Take 1 tablet (10 mg total) by mouth daily.  ? gabapentin (NEURONTIN) 300 MG capsule Take 300 mg by mouth 3 (three) times daily.  ? glipiZIDE (GLUCOTROL XL) 10 MG 24 hr tablet Take 1 tablet (10 mg total) by mouth daily.  ? mirtazapine (REMERON) 7.5 MG tablet Take 7.5 mg by mouth at bedtime.  ? pantoprazole (PROTONIX) 40 MG tablet Take 1 tablet (40 mg total) by mouth daily.  ? tamsulosin (FLOMAX) 0.4 MG CAPS capsule Take 0.4 mg by mouth daily.  ? [DISCONTINUED] carbidopa-levodopa (SINEMET IR) 25-100 MG tablet Take 1 tablet by mouth 3 (three) times daily.  ? ?No facility-administered encounter  medications on file as of 04/28/2022.  ? ? ?Objective:  ? ?PHYSICAL EXAMINATION:   ? ?VITALS:   ?Vitals:  ? 04/28/22 1010  ?BP: 116/62  ?Pulse: 62  ?SpO2: 98%  ?Weight: 207 lb (93.9 kg)  ?Height: 6\' 1"  (1.854 m)  ? ? ?GEN:  The patient appears stated age and is in NAD. ?HEENT:  Normocephalic, atraumatic.  The mucous membranes are moist. The superficial temporal arteries are without ropiness or tenderness. ?CV:  RRR ?Lungs:  CTAB ?Neck/HEME:  There are no carotid bruits bilaterally. ? ?Neurological examination: ? ?Orientation: The patient is alert and oriented x3.  ?Cranial nerves: There is good facial symmetry.  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  There is upgaze and downgaze paresis today and mild restriction in the horizontal directions.  The visual fields are full to confrontational testing. The speech is fluent, but hesitant, and clear, and minimally pseudobulbar. Soft palate rises symmetrically and there is no tongue deviation. Hearing is intact to conversational tone. ?Sensation: Sensation is intact to light touch throughout. ?Motor: Strength is at least antigravity x4. ? ? ?Movement examination: ?Tone: There is mod increased tone in the LUE.  There is more gegenhalten on the right.  This is the same as last visit. ?Abnormal movements: none ?Coordination:  There is decremation with RAM's, with any form of RAMS, including alternating supination and pronation of the forearm, hand opening and closing, finger taps, heel taps and toe taps, L>R ?Gait and Station: The patient requires assistance out of the chair, after 2 attempts in trying to get out of the chair by himself.  He falls to the left when he first gets up, but the examiner catches him.  He grabs his walker and then ambulates fairly well in the hall with a walker, with the examiner holding onto him.   ? ?I have reviewed and interpreted the following labs independently ? ?  Chemistry   ?   ?Component Value Date/Time  ? NA 140 02/15/2022 1133  ? NA 134  09/29/2021 1518  ? K 3.9 02/15/2022 1133  ? CL 105 02/15/2022 1133  ? CO2 27 02/15/2022 1133  ? BUN 14 02/15/2022 1133  ? BUN 16 09/29/2021 1518  ? CREATININE 0.95 02/15/2022 1133  ?    ?Component Value Date/Time  ? CALCIUM 9.0 02/15/2022 1133  ? ALKPHOS 72 02/15/2022 1133  ? AST 18 02/15/2022 1133  ? ALT 17 02/15/2022 1133  ? BILITOT 0.4 02/15/2022 1133  ? BILITOT 0.3 09/29/2021 1518  ?  ? ? ? ?  Lab Results  ?Component Value Date  ? WBC 10.4 02/15/2022  ? HGB 13.0 02/15/2022  ? HCT 38.8 (L) 02/15/2022  ? MCV 83.1 02/15/2022  ? PLT 198 02/15/2022  ? ? ?Lab Results  ?Component Value Date  ? TSH 0.989 09/29/2021  ? ? ? ?Total time spent on today's visit was 31 minutes, including both face-to-face time and nonface-to-face time.  Time included that spent on review of records (prior notes available to me/labs/imaging if pertinent), discussing treatment and goals, answering patient's questions and coordinating care. ? ?Cc:  de [place], Blondell Reveal, MD ? ?

## 2022-04-27 DIAGNOSIS — R488 Other symbolic dysfunctions: Secondary | ICD-10-CM | POA: Diagnosis not present

## 2022-04-27 DIAGNOSIS — R296 Repeated falls: Secondary | ICD-10-CM | POA: Diagnosis not present

## 2022-04-27 DIAGNOSIS — R41841 Cognitive communication deficit: Secondary | ICD-10-CM | POA: Diagnosis not present

## 2022-04-27 DIAGNOSIS — R2689 Other abnormalities of gait and mobility: Secondary | ICD-10-CM | POA: Diagnosis not present

## 2022-04-27 DIAGNOSIS — R2681 Unsteadiness on feet: Secondary | ICD-10-CM | POA: Diagnosis not present

## 2022-04-28 ENCOUNTER — Telehealth: Payer: Self-pay | Admitting: Licensed Clinical Social Worker

## 2022-04-28 ENCOUNTER — Encounter: Payer: Self-pay | Admitting: Neurology

## 2022-04-28 ENCOUNTER — Ambulatory Visit (INDEPENDENT_AMBULATORY_CARE_PROVIDER_SITE_OTHER): Payer: Medicare HMO | Admitting: Neurology

## 2022-04-28 VITALS — BP 116/62 | HR 62 | Ht 73.0 in | Wt 207.0 lb

## 2022-04-28 DIAGNOSIS — G231 Progressive supranuclear ophthalmoplegia [Steele-Richardson-Olszewski]: Secondary | ICD-10-CM

## 2022-04-28 DIAGNOSIS — R41841 Cognitive communication deficit: Secondary | ICD-10-CM | POA: Diagnosis not present

## 2022-04-28 DIAGNOSIS — R488 Other symbolic dysfunctions: Secondary | ICD-10-CM | POA: Diagnosis not present

## 2022-04-28 MED ORDER — AMBULATORY NON FORMULARY MEDICATION: 0 refills | Status: DC

## 2022-04-28 NOTE — Telephone Encounter (Signed)
LCSW made telephone contact with Gene Raimondo, Pt's brother and POA.  LCSW provided updated and concern with Pt with recent and numerous falls .  In addition, that it is recommended that Pt have higher level of care and use a wheel chair.  Pt's brother verbalized understanding and stated that he was going to be in town next week and would start finding higher level of care and follow up on wheel chair for patient.  Pt's brother verbalize appreciation of call . ?

## 2022-04-28 NOTE — Patient Instructions (Signed)
STOP carbidopa/levodopa 25/100 ? ?You need to stay seated, in wheelchair or otherwise, at all times.  Falls will lead to hip fractures or brain bleeds.   ?

## 2022-04-29 DIAGNOSIS — R41841 Cognitive communication deficit: Secondary | ICD-10-CM | POA: Diagnosis not present

## 2022-04-29 DIAGNOSIS — M6281 Muscle weakness (generalized): Secondary | ICD-10-CM | POA: Diagnosis not present

## 2022-04-29 DIAGNOSIS — R488 Other symbolic dysfunctions: Secondary | ICD-10-CM | POA: Diagnosis not present

## 2022-05-02 ENCOUNTER — Telehealth: Payer: Self-pay | Admitting: Licensed Clinical Social Worker

## 2022-05-02 DIAGNOSIS — R488 Other symbolic dysfunctions: Secondary | ICD-10-CM | POA: Diagnosis not present

## 2022-05-02 DIAGNOSIS — R41841 Cognitive communication deficit: Secondary | ICD-10-CM | POA: Diagnosis not present

## 2022-05-02 NOTE — Telephone Encounter (Signed)
Ms. Brandon Vargas with Rehab dept. With [employer] left voice mail message inquiring about wheel chair for Pt and inquiring if Dr. Carles Collet was going to order the wheel chair since he is needing one full time and will be providing the prescription for one?   ?

## 2022-05-03 DIAGNOSIS — R41841 Cognitive communication deficit: Secondary | ICD-10-CM | POA: Diagnosis not present

## 2022-05-03 DIAGNOSIS — R488 Other symbolic dysfunctions: Secondary | ICD-10-CM | POA: Diagnosis not present

## 2022-05-03 DIAGNOSIS — M6281 Muscle weakness (generalized): Secondary | ICD-10-CM | POA: Diagnosis not present

## 2022-05-04 DIAGNOSIS — R296 Repeated falls: Secondary | ICD-10-CM | POA: Diagnosis not present

## 2022-05-04 DIAGNOSIS — R488 Other symbolic dysfunctions: Secondary | ICD-10-CM | POA: Diagnosis not present

## 2022-05-04 DIAGNOSIS — R2689 Other abnormalities of gait and mobility: Secondary | ICD-10-CM | POA: Diagnosis not present

## 2022-05-04 DIAGNOSIS — M6281 Muscle weakness (generalized): Secondary | ICD-10-CM | POA: Diagnosis not present

## 2022-05-04 DIAGNOSIS — R2681 Unsteadiness on feet: Secondary | ICD-10-CM | POA: Diagnosis not present

## 2022-05-05 ENCOUNTER — Ambulatory Visit (INDEPENDENT_AMBULATORY_CARE_PROVIDER_SITE_OTHER): Payer: Medicare HMO | Admitting: Family Medicine

## 2022-05-05 ENCOUNTER — Encounter (HOSPITAL_BASED_OUTPATIENT_CLINIC_OR_DEPARTMENT_OTHER): Payer: Self-pay | Admitting: Family Medicine

## 2022-05-05 VITALS — BP 139/71 | HR 69 | Ht 73.0 in | Wt 200.4 lb

## 2022-05-05 DIAGNOSIS — E119 Type 2 diabetes mellitus without complications: Secondary | ICD-10-CM | POA: Diagnosis not present

## 2022-05-05 DIAGNOSIS — R419 Unspecified symptoms and signs involving cognitive functions and awareness: Secondary | ICD-10-CM | POA: Diagnosis not present

## 2022-05-05 DIAGNOSIS — R911 Solitary pulmonary nodule: Secondary | ICD-10-CM | POA: Diagnosis not present

## 2022-05-05 DIAGNOSIS — M6281 Muscle weakness (generalized): Secondary | ICD-10-CM | POA: Diagnosis not present

## 2022-05-05 DIAGNOSIS — R488 Other symbolic dysfunctions: Secondary | ICD-10-CM | POA: Diagnosis not present

## 2022-05-05 LAB — POCT GLYCOSYLATED HEMOGLOBIN (HGB A1C): Hemoglobin A1C: 6.6 % — AB (ref 4.0–5.6)

## 2022-05-05 MED ORDER — DICLOFENAC SODIUM 1 % EX GEL: 2.0000 g | Freq: Four times a day (QID) | CUTANEOUS | 1 refills | Status: AC

## 2022-05-05 NOTE — Addendum Note (Signed)
Addended by: Lowella Bandy on: 05/05/2022 01:00 PM ? ? Modules accepted: Orders ? ?

## 2022-05-05 NOTE — Assessment & Plan Note (Signed)
Has follow up CT scan scheduled for later this month in about 2 weeks. He will then be following up with Pulmonology after that ?

## 2022-05-05 NOTE — Assessment & Plan Note (Signed)
Did see Dr. Carles Collet recently - she discussed with patient her concerns regarding ambulation and risk of falling. Also discussed his need for further assistance with ADLs. She has a Education officer, museum through her office that has been able to assist with this - they are assisting with getting patient a wheelchair for mobility as well as having patient move into an area at [place] that offers additional assistance with ADLs - he reports that he will be moving into an assisted living portion. ?

## 2022-05-05 NOTE — Assessment & Plan Note (Signed)
Has continued with glipizide 10 mg once daily. No issues with this. Not checking blood sugars at home. Is due for check on A1c to monitor. Given status, loose control of blood sugars is adequate to reduce risk of hypoglycemia. ?

## 2022-05-05 NOTE — Progress Notes (Signed)
? ? ?  Procedures performed today:   ? ?None. ? ?Independent interpretation of notes and tests performed by another provider:  ? ?None. ? ?Brief History, Exam, Impression, and Recommendations:   ? ?BP 139/71   Pulse 69   Ht 6\' 1"  (1.854 m)   Wt 200 lb 6.4 oz (90.9 kg)   SpO2 97%   BMI 26.44 kg/m?  ? ?Pulmonary nodule ?Has follow up CT scan scheduled for later this month in about 2 weeks. He will then be following up with Pulmonology after that ? ?Diabetes ([place]) ?Has continued with glipizide 10 mg once daily. No issues with this. Not checking blood sugars at home. Is due for check on A1c to monitor. Given status, loose control of blood sugars is adequate to reduce risk of hypoglycemia. ? ?Neurocognitive disorder ?Did see Dr. Carles Collet recently - she discussed with patient her concerns regarding ambulation and risk of falling. Also discussed his need for further assistance with ADLs. She has a Education officer, museum through her office that has been able to assist with this - they are assisting with getting patient a wheelchair for mobility as well as having patient move into an area at [place] that offers additional assistance with ADLs - he reports that he will be moving into an assisted living portion. ? ?Plan for close follow-up in about 4-6 weeks or sooner as needed ? ? ?___________________________________________ ?Brandon Mavis [place], MD, ABFM, CAQSM ?Primary Care and Sports Medicine ?[place] ?

## 2022-05-06 DIAGNOSIS — R296 Repeated falls: Secondary | ICD-10-CM | POA: Diagnosis not present

## 2022-05-06 DIAGNOSIS — R488 Other symbolic dysfunctions: Secondary | ICD-10-CM | POA: Diagnosis not present

## 2022-05-06 DIAGNOSIS — R2681 Unsteadiness on feet: Secondary | ICD-10-CM | POA: Diagnosis not present

## 2022-05-06 DIAGNOSIS — R2689 Other abnormalities of gait and mobility: Secondary | ICD-10-CM | POA: Diagnosis not present

## 2022-05-06 DIAGNOSIS — R41841 Cognitive communication deficit: Secondary | ICD-10-CM | POA: Diagnosis not present

## 2022-05-06 NOTE — Progress Notes (Signed)
Pt was called and notified of his hgb a1c.  ? ?Thanks

## 2022-05-09 ENCOUNTER — Other Ambulatory Visit: Payer: Self-pay

## 2022-05-09 ENCOUNTER — Emergency Department (HOSPITAL_BASED_OUTPATIENT_CLINIC_OR_DEPARTMENT_OTHER)
Admission: EM | Admit: 2022-05-09 | Discharge: 2022-05-09 | Disposition: A | Discharge status: Home / Self Care | Payer: Medicare HMO | Attending: Emergency Medicine | Admitting: Emergency Medicine

## 2022-05-09 ENCOUNTER — Emergency Department (HOSPITAL_BASED_OUTPATIENT_CLINIC_OR_DEPARTMENT_OTHER): Payer: Medicare HMO

## 2022-05-09 ENCOUNTER — Encounter (HOSPITAL_BASED_OUTPATIENT_CLINIC_OR_DEPARTMENT_OTHER): Payer: Self-pay | Admitting: Obstetrics and Gynecology

## 2022-05-09 DIAGNOSIS — R58 Hemorrhage, not elsewhere classified: Secondary | ICD-10-CM | POA: Diagnosis not present

## 2022-05-09 DIAGNOSIS — S161XXA Strain of muscle, fascia and tendon at neck level, initial encounter: Secondary | ICD-10-CM | POA: Insufficient documentation

## 2022-05-09 DIAGNOSIS — R269 Unspecified abnormalities of gait and mobility: Secondary | ICD-10-CM | POA: Diagnosis not present

## 2022-05-09 DIAGNOSIS — G20 Parkinson's disease: Secondary | ICD-10-CM | POA: Insufficient documentation

## 2022-05-09 DIAGNOSIS — W1830XA Fall on same level, unspecified, initial encounter: Secondary | ICD-10-CM | POA: Insufficient documentation

## 2022-05-09 DIAGNOSIS — S065X0A Traumatic subdural hemorrhage without loss of consciousness, initial encounter: Secondary | ICD-10-CM | POA: Diagnosis not present

## 2022-05-09 DIAGNOSIS — I6200 Nontraumatic subdural hemorrhage, unspecified: Secondary | ICD-10-CM

## 2022-05-09 DIAGNOSIS — W19XXXA Unspecified fall, initial encounter: Secondary | ICD-10-CM

## 2022-05-09 DIAGNOSIS — M6281 Muscle weakness (generalized): Secondary | ICD-10-CM | POA: Diagnosis not present

## 2022-05-09 DIAGNOSIS — R2689 Other abnormalities of gait and mobility: Secondary | ICD-10-CM | POA: Diagnosis not present

## 2022-05-09 DIAGNOSIS — R296 Repeated falls: Secondary | ICD-10-CM | POA: Diagnosis not present

## 2022-05-09 DIAGNOSIS — Z7401 Bed confinement status: Secondary | ICD-10-CM | POA: Diagnosis not present

## 2022-05-09 DIAGNOSIS — R2681 Unsteadiness on feet: Secondary | ICD-10-CM | POA: Diagnosis not present

## 2022-05-09 DIAGNOSIS — R41841 Cognitive communication deficit: Secondary | ICD-10-CM | POA: Diagnosis not present

## 2022-05-09 DIAGNOSIS — S0990XA Unspecified injury of head, initial encounter: Secondary | ICD-10-CM | POA: Diagnosis not present

## 2022-05-09 DIAGNOSIS — R531 Weakness: Secondary | ICD-10-CM | POA: Diagnosis not present

## 2022-05-09 DIAGNOSIS — R488 Other symbolic dysfunctions: Secondary | ICD-10-CM | POA: Diagnosis not present

## 2022-05-09 NOTE — ED Notes (Signed)
Pt attempted to get out of bed. Pt unhappy with the c-collar. Pt informed that he needed to go to CT to get scanned. Pt redirected. TV was turned on and pt's phone unlocked. Kae Heller - PA informed.  ?

## 2022-05-09 NOTE — ED Triage Notes (Signed)
Patient reports to the ER for a fall. Patient has a laceration that was caused by a fall yesterday, Patient is not on blood thinners. Patient had another fall again today that reopened the wound that was bleeding. Patient is from [place] per EMS.  ?

## 2022-05-09 NOTE — ED Provider Notes (Signed)
?[facility] EMERGENCY DEPT ?Provider Note ? ? ?CSN: [id number] ?Arrival date & time: 05/09/22  1048 ? ?  ? ?History ?Chief Complaint  ?Patient presents with  ? Fall  ? Laceration  ? ? ?Brandon Vargas is a 66 y.o. male with history of Parkinson's with frequent falls who presents to the emergency room today after having 2 falls in the last 24 hours.  Patient states he fell yesterday morning backwards and sustained a laceration to the vertex of the skull.  Patient was not evaluated at that time.  This morning, patient fell again and reopened the wound.  He denies any loss of consciousness, nausea, vomiting, diarrhea. ? ? ?Fall ? ?Laceration ? ?  ? ?Home Medications ?Prior to Admission medications   ?Medication Sig Start Date End Date Taking? Authorizing Provider  ?ACCU-CHEK GUIDE test strip  05/15/20   [provider]  ?acetaminophen (TYLENOL) 325 MG tablet Take 650 mg by mouth every 6 (six) hours as needed.    [provider]  ?AMBULATORY NON FORMULARY MEDICATION DISCONTINUE CARBIDOPA LEVODOPA 04/28/22   Tat, Eustace Quail, DO  ?calcium carbonate (TUMS - DOSED IN MG ELEMENTAL CALCIUM) 500 MG chewable tablet Chew 2 tablets by mouth daily as needed for indigestion or heartburn.    [provider]  ?Cholecalciferol (VITAMIN D3) 25 MCG (1000 UT) CAPS Take 1 capsule (1,000 Units total) by mouth daily. 12/03/21   de [place], Raymond J, MD  ?cyclobenzaprine (FLEXERIL) 5 MG tablet Take 1 tablet (5 mg total) by mouth at bedtime as needed for muscle spasms. 02/01/22   [place], Raymond J, MD  ?diclofenac Sodium (VOLTAREN) 1 % GEL Apply 2 g topically 4 (four) times daily. To affected joint. 05/05/22   [place], Raymond J, MD  ?donepezil (ARICEPT) 10 MG tablet Take 1 tablet (10 mg total) by mouth daily. 12/06/21   [place], Raymond J, MD  ?gabapentin (NEURONTIN) 300 MG capsule Take 300 mg by mouth 3 (three) times daily.    [provider]  ?glipiZIDE (GLUCOTROL XL) 10 MG 24 hr tablet Take 1 tablet  (10 mg total) by mouth daily. 02/01/22   [place], Raymond J, MD  ?mirtazapine (REMERON) 7.5 MG tablet Take 7.5 mg by mouth at bedtime. 09/02/21   [provider]  ?pantoprazole (PROTONIX) 40 MG tablet Take 1 tablet (40 mg total) by mouth daily. 02/22/22   de [place], Raymond J, MD  ?tamsulosin (FLOMAX) 0.4 MG CAPS capsule Take 0.4 mg by mouth daily.    [provider]  ?   ? ?Allergies    ?Patient has no known allergies.   ? ?Review of Systems   ?Review of Systems  ?All other systems reviewed and are negative. ? ?Physical Exam ?Updated Vital Signs ?BP 125/61   Pulse 74   Temp 98.7 ?F (37.1 ?C) (Oral)   Resp 18   Ht 6\' 1"  (1.854 m)   Wt 90.7 kg   SpO2 98%   BMI 26.39 kg/m?  ?Physical Exam ?Vitals and nursing note reviewed.  ?Constitutional:   ?   Appearance: Normal appearance.  ?HENT:  ?   Head: Normocephalic.  ? ?Eyes:  ?   General:     ?   Right eye: No discharge.     ?   Left eye: No discharge.  ?   Conjunctiva/sclera: Conjunctivae normal.  ?Pulmonary:  ?   Effort: Pulmonary effort is normal.  ?Skin: ?   General: Skin is warm and dry.  ?  Findings: No rash.  ?Neurological:  ?   General: No focal deficit present.  ?   Mental Status: He is alert.  ?Psychiatric:     ?   Mood and Affect: Mood normal.     ?   Behavior: Behavior normal.  ? ? ?ED Results / Procedures / Treatments   ?Labs ?(all labs ordered are listed, but only abnormal results are displayed) ?Labs Reviewed - No data to display ? ?EKG ?None ? ?Radiology ?CT Head Wo Contrast ? ?Result Date: 05/09/2022 ?CLINICAL DATA:  Head trauma, moderate-severe fall and head injury EXAM: CT HEAD WITHOUT CONTRAST TECHNIQUE: Contiguous axial images were obtained from the base of the skull through the vertex without intravenous contrast. RADIATION DOSE REDUCTION: This exam was performed according to the departmental dose-optimization program which includes automated exposure control, adjustment of the mA and/or kV according to patient size and/or use of  iterative reconstruction technique. COMPARISON:  CT head February 15, 2022. FINDINGS: Brain: Suspected small hypodense right subdural fluid collection, which measures approximately 4 mm in thickness (for example see series 4, image 33; series 3, image 24; series 5, image 17) and appears new since February 15, 2022. There appears to be some displacement of traversing cortical veins. No midline shift. Vascular: No hyperdense vessel identified. Skull: Left forehead contusion.  No acute fracture. Sinuses/Orbits: Clear visualized sinuses. No visible acute orbital findings. Other: No mastoid effusions. IMPRESSION: 1. Suspected small hypodense right subdural fluid collection, which measures approximately 4 mm in thickness and appears new since February 15, 2022. No midline shift. 2. Left forehead contusion without acute calvarial fracture. Electronically Signed   By: Margaretha Sheffield M.D.   On: 05/09/2022 12:24  ? ?CT Cervical Spine Wo Contrast ? ?Result Date: 05/09/2022 ?CLINICAL DATA:  Multiple falls since yesterday EXAM: CT CERVICAL SPINE WITHOUT CONTRAST TECHNIQUE: Multidetector CT imaging of the cervical spine was performed without intravenous contrast. Multiplanar CT image reconstructions were also generated. RADIATION DOSE REDUCTION: This exam was performed according to the departmental dose-optimization program which includes automated exposure control, adjustment of the mA and/or kV according to patient size and/or use of iterative reconstruction technique. COMPARISON:  Cervical spine CT 02/15/2022 FINDINGS: Alignment: There is reversal of the normal cervical spine lordosis, unchanged. There is no antero or retrolisthesis. There is no jumped or perched facets or other evidence of traumatic malalignment. Skull base and vertebrae: Skull base alignment is maintained. Vertebral body heights are preserved. There is no evidence of acute fracture. Soft tissues and spinal canal: No prevertebral fluid or swelling. No  visible canal hematoma. Disc levels: Disc heights are overall preserved, but there is degenerative endplate change with bulky anterior osteophytes throughout the cervical spine beginning at C4. There is advanced multilevel facet arthropathy throughout the cervical spine. Postsurgical changes reflecting posterior decompression at C3-C4 C4-C5, C5-C6, and C6-C7 are noted. There is no evidence of high-grade spinal canal stenosis. There is multilevel neural foraminal stenosis, unchanged. Upper chest: Imaged lung apices are clear. Other: None. IMPRESSION: No acute fracture or traumatic malalignment of the cervical spine. Electronically Signed   By: Valetta Mole M.D.   On: 05/09/2022 12:23   ? ?Procedures ?Marland KitchenCritical Care ?Performed by: Hendricks Limes, PA-C ?Authorized by: Hendricks Limes, PA-C  ? ?Critical care provider statement:  ?  Critical care time (minutes):  35 ?  Critical care time was exclusive of:  Separately billable procedures and treating other patients ?  Critical care was necessary to treat or prevent imminent or life-threatening  deterioration of the following conditions:  CNS failure or compromise ?  Critical care was time spent personally by me on the following activities:  Discussions with consultants, pulse oximetry, ordering and review of radiographic studies, ordering and review of laboratory studies, development of treatment plan with patient or surrogate, discussions with primary provider, evaluation of patient's response to treatment and re-evaluation of patient's condition  ? ? ?Medications Ordered in ED ?Medications - No data to display ? ?ED Course/ Medical Decision Making/ A&P ?Clinical Course as of 05/09/22 1348  ?Mon May 09, 2022  ?1344 I spoke with Dr. Zada Finders with neurosurgery who reviewed the images of the CT scan and states that the patient is safe to go home. [CF]  ?  ?Clinical Course User Index ?[CF] Myna Bright M, PA-C  ? ?                        ?Medical Decision  Making ?Luz Burcher Nienaber is a 66 y.o. male who presents to the emergency department today for further evaluation of a fall and head injury.  These wounds are over 24 hours old on the skull and I do not feel that suturing or stapli

## 2022-05-09 NOTE — ED Notes (Signed)
Discharge paperwork given and understood. 

## 2022-05-09 NOTE — ED Notes (Signed)
Pt's c-collar removed, per Kae Heller - PA ?

## 2022-05-09 NOTE — Discharge Instructions (Addendum)
Please keep wound clean and dry.  We were unable to close it today considering when the wound happened.  There is a high risk of infection if we continue and try and close this.  Please follow-up with your primary care doctor or return to the emergency department for any worsening symptoms you might have. ?

## 2022-05-09 NOTE — ED Notes (Signed)
Patient transported to CT 

## 2022-05-09 NOTE — ED Notes (Signed)
Report attempted to receiving staff. Staff stated that there was no need bc they are strictly independent. ?

## 2022-05-09 NOTE — ED Notes (Signed)
Holding due to PTAR. ?

## 2022-05-10 ENCOUNTER — Telehealth (HOSPITAL_BASED_OUTPATIENT_CLINIC_OR_DEPARTMENT_OTHER): Payer: Self-pay

## 2022-05-10 ENCOUNTER — Encounter (HOSPITAL_BASED_OUTPATIENT_CLINIC_OR_DEPARTMENT_OTHER): Payer: Self-pay | Admitting: Family Medicine

## 2022-05-10 ENCOUNTER — Telehealth: Payer: Self-pay | Admitting: Neurology

## 2022-05-10 ENCOUNTER — Encounter: Payer: Self-pay | Admitting: Neurology

## 2022-05-10 DIAGNOSIS — R41841 Cognitive communication deficit: Secondary | ICD-10-CM | POA: Diagnosis not present

## 2022-05-10 DIAGNOSIS — R2689 Other abnormalities of gait and mobility: Secondary | ICD-10-CM | POA: Diagnosis not present

## 2022-05-10 DIAGNOSIS — R2681 Unsteadiness on feet: Secondary | ICD-10-CM | POA: Diagnosis not present

## 2022-05-10 DIAGNOSIS — R296 Repeated falls: Secondary | ICD-10-CM | POA: Diagnosis not present

## 2022-05-10 DIAGNOSIS — R488 Other symbolic dysfunctions: Secondary | ICD-10-CM | POA: Diagnosis not present

## 2022-05-10 NOTE — Telephone Encounter (Signed)
Spoke with patient's nurse Lelon Frohlich, was given permission to speak with her from patient's brother Romie Minus [place]. FL2 form was completed and signed, she is aware of $25.00 fee. Signed was placed in basket to be scanned into patients chart.  ?

## 2022-05-10 NOTE — Telephone Encounter (Signed)
Brandon Vargas, Brandon Vargas nurse called in about the [facility] form that needs to be completed. She stated his PCP said Tat has to fill it out. His PCP wont fill it out. Brandon Vargas is very frustrated, she stated she cant get anyone to fill this form out. The patient is falling daily and has cut his head etc. He needs care 24/7. She wants a call back from Tat.  ?Ann's number is [phone] ?

## 2022-05-10 NOTE — Telephone Encounter (Signed)
Brandon Vargas is not on patients DPR. Will send message to patient via Mychart informing him that we do not complete FL2 forms.  ?

## 2022-05-11 ENCOUNTER — Other Ambulatory Visit (HOSPITAL_BASED_OUTPATIENT_CLINIC_OR_DEPARTMENT_OTHER): Payer: Self-pay

## 2022-05-11 ENCOUNTER — Other Ambulatory Visit (HOSPITAL_BASED_OUTPATIENT_CLINIC_OR_DEPARTMENT_OTHER): Payer: Self-pay | Admitting: Family Medicine

## 2022-05-11 DIAGNOSIS — R488 Other symbolic dysfunctions: Secondary | ICD-10-CM | POA: Diagnosis not present

## 2022-05-11 DIAGNOSIS — M6281 Muscle weakness (generalized): Secondary | ICD-10-CM | POA: Diagnosis not present

## 2022-05-11 DIAGNOSIS — R41841 Cognitive communication deficit: Secondary | ICD-10-CM | POA: Diagnosis not present

## 2022-05-11 MED ORDER — GLIPIZIDE ER 10 MG PO TB24
10.0000 mg | ORAL_TABLET | Freq: Every day | ORAL | 0 refills | Status: DC
  Filled 2022-05-11: qty 90, 90d supply, fill #0

## 2022-05-11 NOTE — Telephone Encounter (Signed)
$  25 payment was made on 5/17 @ 11:43 am. ?

## 2022-05-11 NOTE — Telephone Encounter (Signed)
Pt's home health aide came to pick up documentation on 5/17 @ 12:00 pm. ?

## 2022-05-12 ENCOUNTER — Other Ambulatory Visit (HOSPITAL_BASED_OUTPATIENT_CLINIC_OR_DEPARTMENT_OTHER): Payer: Self-pay | Admitting: Family Medicine

## 2022-05-12 ENCOUNTER — Ambulatory Visit (INDEPENDENT_AMBULATORY_CARE_PROVIDER_SITE_OTHER): Payer: Medicare HMO | Admitting: Family Medicine

## 2022-05-12 DIAGNOSIS — R419 Unspecified symptoms and signs involving cognitive functions and awareness: Secondary | ICD-10-CM

## 2022-05-13 ENCOUNTER — Other Ambulatory Visit (HOSPITAL_BASED_OUTPATIENT_CLINIC_OR_DEPARTMENT_OTHER): Payer: Self-pay

## 2022-05-13 DIAGNOSIS — R488 Other symbolic dysfunctions: Secondary | ICD-10-CM | POA: Diagnosis not present

## 2022-05-13 DIAGNOSIS — M6281 Muscle weakness (generalized): Secondary | ICD-10-CM | POA: Diagnosis not present

## 2022-05-14 ENCOUNTER — Emergency Department (HOSPITAL_BASED_OUTPATIENT_CLINIC_OR_DEPARTMENT_OTHER): Payer: Medicare HMO | Admitting: Radiology

## 2022-05-14 ENCOUNTER — Other Ambulatory Visit: Payer: Self-pay

## 2022-05-14 ENCOUNTER — Observation Stay (HOSPITAL_BASED_OUTPATIENT_CLINIC_OR_DEPARTMENT_OTHER)
Admission: EM | Admit: 2022-05-14 | Discharge: 2022-05-19 | Disposition: A | Discharge status: Home Health | Payer: Medicare HMO | Attending: Student | Admitting: Student

## 2022-05-14 ENCOUNTER — Emergency Department (HOSPITAL_BASED_OUTPATIENT_CLINIC_OR_DEPARTMENT_OTHER): Payer: Medicare HMO

## 2022-05-14 DIAGNOSIS — W19XXXA Unspecified fall, initial encounter: Secondary | ICD-10-CM | POA: Diagnosis present

## 2022-05-14 DIAGNOSIS — W01198A Fall on same level from slipping, tripping and stumbling with subsequent striking against other object, initial encounter: Secondary | ICD-10-CM | POA: Insufficient documentation

## 2022-05-14 DIAGNOSIS — S065X0A Traumatic subdural hemorrhage without loss of consciousness, initial encounter: Secondary | ICD-10-CM | POA: Insufficient documentation

## 2022-05-14 DIAGNOSIS — R262 Difficulty in walking, not elsewhere classified: Secondary | ICD-10-CM | POA: Diagnosis present

## 2022-05-14 DIAGNOSIS — R296 Repeated falls: Secondary | ICD-10-CM | POA: Diagnosis not present

## 2022-05-14 DIAGNOSIS — S199XXA Unspecified injury of neck, initial encounter: Secondary | ICD-10-CM | POA: Diagnosis not present

## 2022-05-14 DIAGNOSIS — D649 Anemia, unspecified: Secondary | ICD-10-CM | POA: Diagnosis not present

## 2022-05-14 DIAGNOSIS — Z7984 Long term (current) use of oral hypoglycemic drugs: Secondary | ICD-10-CM | POA: Diagnosis not present

## 2022-05-14 DIAGNOSIS — Z79899 Other long term (current) drug therapy: Secondary | ICD-10-CM | POA: Diagnosis not present

## 2022-05-14 DIAGNOSIS — R2243 Localized swelling, mass and lump, lower limb, bilateral: Secondary | ICD-10-CM | POA: Diagnosis not present

## 2022-05-14 DIAGNOSIS — N400 Enlarged prostate without lower urinary tract symptoms: Secondary | ICD-10-CM | POA: Diagnosis not present

## 2022-05-14 DIAGNOSIS — R911 Solitary pulmonary nodule: Secondary | ICD-10-CM | POA: Diagnosis present

## 2022-05-14 DIAGNOSIS — F0390 Unspecified dementia without behavioral disturbance: Secondary | ICD-10-CM

## 2022-05-14 DIAGNOSIS — G231 Progressive supranuclear ophthalmoplegia [Steele-Richardson-Olszewski]: Secondary | ICD-10-CM

## 2022-05-14 DIAGNOSIS — R079 Chest pain, unspecified: Secondary | ICD-10-CM | POA: Diagnosis not present

## 2022-05-14 DIAGNOSIS — R9431 Abnormal electrocardiogram [ECG] [EKG]: Secondary | ICD-10-CM | POA: Diagnosis not present

## 2022-05-14 DIAGNOSIS — E119 Type 2 diabetes mellitus without complications: Secondary | ICD-10-CM | POA: Diagnosis not present

## 2022-05-14 DIAGNOSIS — M7989 Other specified soft tissue disorders: Secondary | ICD-10-CM | POA: Diagnosis not present

## 2022-05-14 DIAGNOSIS — I10 Essential (primary) hypertension: Secondary | ICD-10-CM | POA: Diagnosis not present

## 2022-05-14 DIAGNOSIS — K219 Gastro-esophageal reflux disease without esophagitis: Secondary | ICD-10-CM

## 2022-05-14 DIAGNOSIS — G9608 Other cranial cerebrospinal fluid leak: Secondary | ICD-10-CM

## 2022-05-14 DIAGNOSIS — R2689 Other abnormalities of gait and mobility: Secondary | ICD-10-CM | POA: Diagnosis not present

## 2022-05-14 DIAGNOSIS — G4700 Insomnia, unspecified: Secondary | ICD-10-CM | POA: Diagnosis present

## 2022-05-14 DIAGNOSIS — S0990XA Unspecified injury of head, initial encounter: Secondary | ICD-10-CM | POA: Diagnosis not present

## 2022-05-14 LAB — URINALYSIS, ROUTINE W REFLEX MICROSCOPIC
Bilirubin Urine: NEGATIVE
Glucose, UA: NEGATIVE mg/dL
Hgb urine dipstick: NEGATIVE
Ketones, ur: NEGATIVE mg/dL
Leukocytes,Ua: NEGATIVE
Nitrite: NEGATIVE
Specific Gravity, Urine: 1.019 (ref 1.005–1.030)
pH: 7 (ref 5.0–8.0)

## 2022-05-14 LAB — COMPREHENSIVE METABOLIC PANEL
ALT: 22 U/L (ref 0–44)
AST: 21 U/L (ref 15–41)
Albumin: 3.7 g/dL (ref 3.5–5.0)
Alkaline Phosphatase: 69 U/L (ref 38–126)
Anion gap: 9 (ref 5–15)
BUN: 18 mg/dL (ref 8–23)
CO2: 27 mmol/L (ref 22–32)
Calcium: 8.7 mg/dL — ABNORMAL LOW (ref 8.9–10.3)
Chloride: 105 mmol/L (ref 98–111)
Creatinine, Ser: 0.88 mg/dL (ref 0.61–1.24)
GFR, Estimated: >60 mL/min (ref >60)
Glucose, Bld: 105 mg/dL — ABNORMAL HIGH (ref 70–99)
Potassium: 4.1 mmol/L (ref 3.5–5.1)
Sodium: 141 mmol/L (ref 135–145)
Total Bilirubin: 0.9 mg/dL (ref 0.3–1.2)
Total Protein: 6.5 g/dL (ref 6.5–8.1)

## 2022-05-14 LAB — CBC WITH DIFFERENTIAL/PLATELET
Abs Immature Granulocytes: 0.05 10*3/uL (ref 0.00–0.07)
Basophils Absolute: 0 10*3/uL (ref 0.0–0.1)
Basophils Relative: 0 %
Eosinophils Absolute: 0 10*3/uL (ref 0.0–0.5)
Eosinophils Relative: 0 %
HCT: 36.8 % — ABNORMAL LOW (ref 39.0–52.0)
Hemoglobin: 11.9 g/dL — ABNORMAL LOW (ref 13.0–17.0)
Immature Granulocytes: 1 %
Lymphocytes Relative: 10 %
Lymphs Abs: 1.1 10*3/uL (ref 0.7–4.0)
MCH: 27 pg (ref 26.0–34.0)
MCHC: 32.3 g/dL (ref 30.0–36.0)
MCV: 83.6 fL (ref 80.0–100.0)
Monocytes Absolute: 0.6 10*3/uL (ref 0.1–1.0)
Monocytes Relative: 6 %
Neutro Abs: 8.9 10*3/uL — ABNORMAL HIGH (ref 1.7–7.7)
Neutrophils Relative %: 83 %
Platelets: 297 10*3/uL (ref 150–400)
RBC: 4.4 MIL/uL (ref 4.22–5.81)
RDW: 13.7 % (ref 11.5–15.5)
WBC: 10.7 10*3/uL — ABNORMAL HIGH (ref 4.0–10.5)
nRBC: 0 % (ref 0.0–0.2)

## 2022-05-14 LAB — IRON AND TIBC
Iron: 52 ug/dL (ref 45–182)
Saturation Ratios: 16 % — ABNORMAL LOW (ref 17.9–39.5)
TIBC: 317 ug/dL (ref 250–450)
UIBC: 265 ug/dL

## 2022-05-14 LAB — GLUCOSE, CAPILLARY
Glucose-Capillary: 96 mg/dL (ref 70–99)
Glucose-Capillary: 95 mg/dL (ref 70–99)

## 2022-05-14 LAB — TSH: TSH: 1.131 u[IU]/mL (ref 0.350–4.500)

## 2022-05-14 LAB — CBG MONITORING, ED: Glucose-Capillary: 74 mg/dL (ref 70–99)

## 2022-05-14 LAB — VITAMIN B12: Vitamin B-12: 336 pg/mL (ref 180–914)

## 2022-05-14 LAB — FERRITIN: Ferritin: 147 ng/mL (ref 24–336)

## 2022-05-14 MED ORDER — MIRTAZAPINE 15 MG PO TABS
7.5000 mg | ORAL_TABLET | Freq: Every day | ORAL | Status: DC
  Administered 2022-05-14 – 2022-05-19 (×6): 7.5 mg via ORAL
  Filled 2022-05-14 (×6): qty 1

## 2022-05-14 MED ORDER — CYCLOBENZAPRINE HCL 5 MG PO TABS
5.0000 mg | ORAL_TABLET | Freq: Every evening | ORAL | Status: DC | PRN
  Administered 2022-05-14 – 2022-05-18 (×2): 5 mg via ORAL
  Filled 2022-05-14 (×2): qty 1

## 2022-05-14 MED ORDER — INSULIN ASPART 100 UNIT/ML IJ SOLN
0.0000 [IU] | Freq: Three times a day (TID) | INTRAMUSCULAR | Status: DC
  Administered 2022-05-16: 1 [IU] via SUBCUTANEOUS
  Administered 2022-05-17: 2 [IU] via SUBCUTANEOUS
  Administered 2022-05-17 – 2022-05-18 (×2): 1 [IU] via SUBCUTANEOUS
  Administered 2022-05-18: 2 [IU] via SUBCUTANEOUS
  Administered 2022-05-19: 5 [IU] via SUBCUTANEOUS

## 2022-05-14 MED ORDER — TAMSULOSIN HCL 0.4 MG PO CAPS
0.4000 mg | ORAL_CAPSULE | Freq: Every day | ORAL | Status: DC
  Administered 2022-05-15 – 2022-05-19 (×5): 0.4 mg via ORAL
  Filled 2022-05-14 (×5): qty 1

## 2022-05-14 MED ORDER — ACETAMINOPHEN 650 MG RE SUPP: 650.0000 mg | Freq: Four times a day (QID) | RECTAL | Status: DC | PRN

## 2022-05-14 MED ORDER — DONEPEZIL HCL 10 MG PO TABS
10.0000 mg | ORAL_TABLET | Freq: Every day | ORAL | Status: DC
  Administered 2022-05-14 – 2022-05-19 (×6): 10 mg via ORAL
  Filled 2022-05-14 (×6): qty 1

## 2022-05-14 MED ORDER — LORAZEPAM 2 MG/ML IJ SOLN
0.2500 mg | Freq: Once | INTRAMUSCULAR | Status: AC
  Administered 2022-05-14: 0.25 mg via INTRAVENOUS
  Filled 2022-05-14: qty 1

## 2022-05-14 MED ORDER — SODIUM CHLORIDE 0.9% FLUSH
3.0000 mL | Freq: Two times a day (BID) | INTRAVENOUS | Status: DC
  Administered 2022-05-15 – 2022-05-19 (×10): 3 mL via INTRAVENOUS

## 2022-05-14 MED ORDER — SODIUM CHLORIDE 0.9% FLUSH
3.0000 mL | INTRAVENOUS | Status: DC | PRN
  Administered 2022-05-14: 3 mL via INTRAVENOUS

## 2022-05-14 MED ORDER — GABAPENTIN 300 MG PO CAPS: 300.0000 mg | ORAL_CAPSULE | Freq: Three times a day (TID) | ORAL | Status: DC

## 2022-05-14 MED ORDER — SODIUM CHLORIDE 0.9 % IV SOLN: 250.0000 mL | INTRAVENOUS | Status: DC | PRN

## 2022-05-14 MED ORDER — PANTOPRAZOLE SODIUM 40 MG PO TBEC
40.0000 mg | DELAYED_RELEASE_TABLET | Freq: Every day | ORAL | Status: DC
  Administered 2022-05-15 – 2022-05-19 (×5): 40 mg via ORAL
  Filled 2022-05-14 (×5): qty 1

## 2022-05-14 MED ORDER — ACETAMINOPHEN 325 MG PO TABS
650.0000 mg | ORAL_TABLET | Freq: Four times a day (QID) | ORAL | Status: DC | PRN
  Administered 2022-05-18 – 2022-05-19 (×3): 650 mg via ORAL
  Filled 2022-05-14 (×3): qty 2

## 2022-05-14 NOTE — Assessment & Plan Note (Addendum)
Recent Labs    09/29/21 1518 02/15/22 1133 05/14/22 0740 05/15/22 0327 05/16/22 1237  HGB 15.4 13.0 11.9* 11.4* 12.1*  H&H stable.  No signs of bleeding anemia

## 2022-05-14 NOTE — Assessment & Plan Note (Addendum)
He could also have autonomic dysregulation given his advanced dementia but orthostatic vitals negative.  TSH within normal. -Discontinued glipizide due to risk of hypoglycemia and fall -Fall precaution -Continue therapy -Outpatient follow-up with neurology

## 2022-05-14 NOTE — ED Notes (Signed)
Had dificulty obtaining EKG, lot of artifacts,then had trouble with leads and export. EKG obtained and given to MD

## 2022-05-14 NOTE — ED Triage Notes (Addendum)
BIBA, pt from indept living, multiples falls over the past few days, hx of same, sts that he is tripping and falling, uses walker at baseline, pt denies LOC, no thinners on med list. Pt alert and oriented x 4, bruising noted on right arm and left hip that is in different stages of healing but appears old, abrasions to left side of head. Pt denies pain.

## 2022-05-14 NOTE — Assessment & Plan Note (Signed)
Subdural hygromas have increase din interval; however, neurosurgery did not fell these were clinically significant and does not require further work up.

## 2022-05-14 NOTE — Progress Notes (Signed)
Plan of Care Note for accepted transfer   Patient: Brandon Vargas MRN: [id number]   DOA: 05/14/2022  Facility requesting transfer: [facility] Requesting Provider: Dr. Roslynn Amble  Reason for transfer: progressive PSP Facility course: 66 year old male with hx of T2DM, insomnia, progressive supranuclear palsy with repetitive falls, dementia, lung nodule followed by pulmonology who presented to ED with complaints of fall this AM. He has progressive subdural hygromas, neurosurgery was called and stated there was nothing to do regarding this. He can not even stand up or walk. Waiting on placement, has no family to to assist at this time.    Vitals: stable,  Pertinent labs: wbc: 10.7, hgb: 11.9, UA unremarkable.  CT head: subdural hygromas, increased in the interval. Soft tissue swelling over the forehead.  Cervical spine CT: no fx or acute finding Left elbow: no fx CXR: no active disease Hip xray: no fx  Plan of care: Observation for PT/OT eval as he needs placement and not safe to go home.   Author: Orma Flaming, MD 05/14/2022  Check [email or web address] for on-call coverage.  Nursing staff, Please call Hendricks number on Amion as soon as patient's arrival, so appropriate admitting provider can evaluate the pt.

## 2022-05-14 NOTE — Care Management (Addendum)
Called Daughter Shirlee Limerick and son at listed numbers. Left confidential messages on both phones to return call.   The patient is having trouble ambulating, wheelchair has been ordered for delivery to his Independent living, however the patient has no- one there with him, and the likelihood of fall  with a decrease in mobility and function is elevated  making this a complicated discharge. The patient is  being set up with assisted living as mentioned earlier.  73 Called and spoke to Gene, brother who is POA. He has a appointment to evaluate at [facility] for admission Wed. He cannot pay for caretaker at this time. Brother is currently in [place]. The nurse he hired was going  to take him to  the appointment on Monday at [facility].  Gene stated that the assessment from the NH could come assess him Monday..  Brother asked if he could be admitted,  discussed admission criteria. Discussed transfer for PT recommendations..  Brother cannot pick patient up today.He is in [place], He cannot afford to hire a sitter, last time they sat with him for 44 hours and he paid 2300 out of pocket. Patient has check for 2600 ma month. He has started Medicaid process, but had paused it when he thought he would be able to go to [place]. He will restart process. Discussed  caveats, PT recommendation, Medicare /source of payment etc.   Daughter called back Shirlee Limerick) she cannot come, she lives in [place] and he car is in the shop.  TOC  will continue to follow 1455 The patient has been accepted for admit to hospitalist  service and  transfer to [facility]. Gene, MPOA notified of transfer.  Cory from [facility] notified that patient may go to SNF. Awaiting PT evaluation in AM

## 2022-05-14 NOTE — Care Management (Signed)
    Durable Medical Equipment  (From admission, onward)           Start     Ordered   05/14/22 0904  For home use only DME standard manual wheelchair with seat cushion  Once       Comments: Patient suffers from weakness, falls which impairs their ability to perform daily activities like toileting in the home.  A walker will not resolve issue with performing activities of daily living. A wheelchair will allow patient to safely perform daily activities. Patient can safely propel the wheelchair in the home or has a caregiver who can provide assistance. Length of need Lifetime. Accessories: elevating leg rests (ELRs), wheel locks, extensions and anti-tippers.   05/14/22 8335

## 2022-05-14 NOTE — TOC Transition Note (Signed)
Transition of Care [facility]) - CM/SW Discharge Note   Patient Details  Name: Brandon Vargas MRN: [id number] Date of Birth: October 26, 1956  [facility] CM/SW Contact:  Verdell Carmine, RN Phone Number: 05/14/2022, 9:13 AM   Clinical Narrative:      [facility] will accept for home health and can carry through to Assisted living. .Wheelchair ordered via [employer], they will deliver to current address.        Patient Goals and CMS Choice        Discharge Placement               Home [place] with [facility]        Discharge Plan and Services                DME Arranged: Wheelchair manual DME Agency: [employer]       HH Arranged: RN, PT, OT, Nurse's Aide, Social Work [employer] Agency: [place] Date [facility] Agency Contacted: 05/14/22 Time [place]: [phone] Representative spoke with at [place]: [place] Interventions     Readmission Risk Interventions     View : No data to display.

## 2022-05-14 NOTE — H&P (Signed)
History and Physical    Patient: Brandon Vargas UVO:[id number] DOB: 28-Aug-1956 DOA: 05/14/2022 DOS: the patient was seen and examined on 05/14/2022 PCP: [place], Blondell Reveal, MD  Patient coming from:  [facility]  - lives in [place] in independent living    Chief Complaint: frequent falls  HPI: Brandon Vargas is a 66 y.o. male with medical history significant of  T2DM, insomnia, progressive supranuclear palsy with repetitive falls, dementia, lung nodule followed by pulmonology who presented to ED with complaints of fall this AM. He states he fell down about 4 times and he had a hard time getting up. History is difficult. Someone from group home called EMS. He doesn't remember if he hit his head. He denies any prodromal symptoms, never lost consciousness.   He has progressive subdural hygromas, neurosurgery was called and stated there was nothing to do regarding this. He can not even stand up or walk. Waiting on placement, has no family to to assist at this time.  I spoke with his brother, Brandon Vargas, who is his POA who tells me he is in independent living where all meals are provided, but there is no nursing care. In the mast few months alone he has been taking to the ER for falls over 5 times due to weakness and poor balance from his PSP. His balance by the week has gotten worse and worse. He has fallen almost once a day so he has been trying to get him into a SNF. His brother did hire a nurse that comes in and gives him all of his medication. She also helps with bathing three times a week.    Denies any fever/chills, vision changes/headaches, chest pain or palpitations, shortness of breath or cough, abdominal pain, N/V/D, dysuria. His leg swelling is at baseline.   He does not smoke or drink alcohol.   ER Course:  vitals: afebrile, bp: 127/76, HR: 78, RR: 20, oxygen: 98%RA Pertinent labs: wbc: 10.7, hgb: 11.9,  CT head: subdural hygromas, increased in the interval. Soft tissue swelling over the  forehead.  Cervical spine CT: no fx or acute finding Left elbow: no fx CXR: no active disease Hip xray: no fx In ED: neurosurgery was called and did not feel this was clinically significant and does not require andy additional work up or any specific follow up.  He was going to go home; however, when staff got him to ambulate he required max assist and became very unsteady with just a few steps. Concern for patient's safety to independent living facility so admitted for obs/PT/ambulatory dysfunction.     Review of Systems: As mentioned in the history of present illness. All other systems reviewed and are negative. Past Medical History:  Diagnosis Date   Anxiety    Arthritis    Depression    Diabetes mellitus without complication ([place])    GERD (gastroesophageal reflux disease)    Insomnia    Pre-diabetes    Wears glasses    Past Surgical History:  Procedure Laterality Date   CERVICAL LAMINOPLASTY  02/27/2020   COLONOSCOPY  2016   KNEE ARTHROSCOPY W/ MENISCAL REPAIR Left    RADIOLOGY WITH ANESTHESIA N/A 10/03/2019   Procedure: MRI WITH ANESTHESIA BRAIN AND CERVICAL SPINE WITHOUT CONTRAST;  Surgeon: Radiologist, Medication, MD;  Location: [facility];  Service: Radiology;  Laterality: N/A;   ROTATOR CUFF REPAIR     VASECTOMY     Social History:  reports that he has never smoked. He has never been  exposed to tobacco smoke. He has never used smokeless tobacco. He reports current alcohol use. He reports that he does not currently use drugs after having used the following drugs: Marijuana.  No Known Allergies  Family History  Problem Relation Age of Onset   Lung cancer Mother    Colon cancer Father 18   Diabetes Father    Cancer - Colon Father    Colon polyps Father    Diabetes Sister    Diabetes Brother    Lung cancer Brother    Rectal cancer Neg Hx    Stomach cancer Neg Hx     Prior to Admission medications   Medication Sig Start Date End Date Taking? Authorizing Provider   ACCU-CHEK GUIDE test strip  05/15/20   [provider]  acetaminophen (TYLENOL) 325 MG tablet Take 650 mg by mouth every 6 (six) hours as needed.    [provider]  AMBULATORY NON FORMULARY MEDICATION DISCONTINUE CARBIDOPA LEVODOPA 04/28/22   Tat, Eustace Quail, DO  calcium carbonate (TUMS - DOSED IN MG ELEMENTAL CALCIUM) 500 MG chewable tablet Chew 2 tablets by mouth daily as needed for indigestion or heartburn.    [provider]  Cholecalciferol (VITAMIN D3) 25 MCG (1000 UT) CAPS Take 1 capsule (1,000 Units total) by mouth daily. 12/03/21   [place], Blondell Reveal, MD  cyclobenzaprine (FLEXERIL) 5 MG tablet Take 1 tablet (5 mg total) by mouth at bedtime as needed for muscle spasms. 02/01/22   [place], Blondell Reveal, MD  diclofenac Sodium (VOLTAREN) 1 % GEL Apply 2 g topically 4 (four) times daily. To affected joint. 05/05/22   [place], Raymond J, MD  donepezil (ARICEPT) 10 MG tablet Take 1 tablet (10 mg total) by mouth daily. 12/06/21   [place], Raymond J, MD  gabapentin (NEURONTIN) 300 MG capsule Take 300 mg by mouth 3 (three) times daily.    [provider]  glipiZIDE (GLUCOTROL XL) 10 MG 24 hr tablet Take 1 tablet (10 mg total) by mouth daily. 05/11/22   Orma Render, NP  mirtazapine (REMERON) 7.5 MG tablet Take 7.5 mg by mouth at bedtime. 09/02/21   [provider]  pantoprazole (PROTONIX) 40 MG tablet Take 1 tablet (40 mg total) by mouth daily. 02/22/22   [place], Blondell Reveal, MD  tamsulosin (FLOMAX) 0.4 MG CAPS capsule Take 0.4 mg by mouth daily.    [provider]    Physical Exam: Vitals:   05/14/22 1045 05/14/22 1300 05/14/22 1451 05/14/22 1700  BP: (!) 149/88  139/62 116/62  Pulse: 78 73 84 64  Resp: 17  16 16   Temp:   98.2 F (36.8 C) 98.4 F (36.9 C)  TempSrc:    Oral  SpO2: 98% 96% 98% 100%  Weight:      Height:       General:  Appears calm and comfortable and is in NAD. Eyes closed.  Eyes:  PERRL-pin point, EOMI, normal lids, iris ENT:   grossly normal hearing, lips & tongue, mmm; appropriate dentition Neck:  no LAD, masses or thyromegaly; no carotid bruits Cardiovascular:  RRR, no m/r/g. 2+ pitting edema in bilateral LE Respiratory:   CTA bilaterally with no wheezes/rales/rhonchi.  Normal respiratory effort. Abdomen:  soft, NT, ND, NABS Back:   normal alignment, no CVAT Skin:  no rash or induration seen on limited exam. He has skin tears around left elbow and large bruise on left bicep. Mild skin tear on frontal lobe.  Musculoskeletal:  BUE: 5/5, BLE: 2-3/5, slow movement.  no bony abnormality Lower extremity:  2+ pitting edema bilaterally with bilateral erythema.   Limited foot exam with no ulcerations.  2+ distal pulses. Psychiatric:  flat mood and affect, poor memory.  A&Ox3 Neurologic:  would not move eyes to follow finger. CN 7-12 grossly intact, moves all extremities in coordinated fashion, sensation intact   Radiological Exams on Admission: Independently reviewed - see discussion in A/P where applicable  DG Chest 1 View  Result Date: 05/14/2022 CLINICAL DATA:  Multiple falls, pain EXAM: CHEST  1 VIEW COMPARISON:  Previous studies including the examination of 06/06/2017 FINDINGS: Cardiac size is within normal limits. There are no signs of pulmonary edema or focal pulmonary consolidation. There is no pleural effusion or pneumothorax. Deformities are noted in the left seventh, eighth and ninth ribs suggesting possible healed fractures. IMPRESSION: No active disease. Electronically Signed   By: Elmer Picker M.D.   On: 05/14/2022 09:10   DG Elbow Complete Left  Result Date: 05/14/2022 CLINICAL DATA:  Multiple falls, pain EXAM: LEFT ELBOW - COMPLETE 3+ VIEW COMPARISON:  03/15/2022 FINDINGS: No recent fracture or dislocation is seen. There are tiny submillimeter calcific density along the medial and lateral aspect of distal humerus possibly residual calcifications from previous injury. This finding has not changed.  There is no displacement of posterior fat pad. There is soft tissue swelling and subcutaneous stranding along the posterior aspect suggesting contusion/hematoma. IMPRESSION: No recent fracture or dislocation is seen in the left elbow. Electronically Signed   By: Elmer Picker M.D.   On: 05/14/2022 09:07   CT Head Wo Contrast  Result Date: 05/14/2022 CLINICAL DATA:  Moderate to severe head trauma.  Multiple falls. EXAM: CT HEAD WITHOUT CONTRAST CT CERVICAL SPINE WITHOUT CONTRAST TECHNIQUE: Multidetector CT imaging of the head and cervical spine was performed following the standard protocol without intravenous contrast. Multiplanar CT image reconstructions of the cervical spine were also generated. RADIATION DOSE REDUCTION: This exam was performed according to the departmental dose-optimization program which includes automated exposure control, adjustment of the mA and/or kV according to patient size and/or use of iterative reconstruction technique. COMPARISON:  CT scan of the brain and cervical spine May 09, 2022. CT scan of the brain and cervical spine February 15, 2022. FINDINGS: CT HEAD FINDINGS Brain: There is increasing low-density fluid over the convexities within the subdural space bilaterally. The maximum thickness of this fluid on the right is seen on coronal image 31 of series 8 measuring 11 mm in thickness today versus 9 mm previously. The low-attenuation fluid in the left subdural space is also increased but is less prominent compared to the right. There is mild effacement of underlying sulci. No midline shift. The cerebellum, brainstem, and basal cisterns are normal. No acute cortical ischemia or infarct. Vascular: No hyperdense vessel or unexpected calcification. Skull: Normal. Negative for fracture or focal lesion. Sinuses/Orbits: No acute finding. Other: Soft tissue swelling over the left forehead. CT CERVICAL SPINE FINDINGS Alignment: Reversal of normal lordosis. Skull base and vertebrae:  Prior left laminectomies at C4 and C5. No acute fractures. Soft tissues and spinal canal: No prevertebral fluid or swelling. No visible canal hematoma. Disc levels: Multilevel degenerative disc disease and facet degenerative changes. Upper chest: Negative. Other: No other abnormalities IMPRESSION: 1. Low density subdural fluid collections bilaterally. The thickness of the low-density fluid on the right has increased from 9 mm on May 09, 2022 to 11 mm on today's study. The left-sided subdural low-density  fluid has also increased in the interval from 4 mm on series 4, image 33 on the May 15 study to 8 mm today. The findings are consistent with subdural hygromas, increased in the interval. Recommend clinical correlation. 2. Soft tissue swelling over the left forehead. 3. No fracture or traumatic malalignment in the cervical spine. Degenerative changes as above. Electronically Signed   By: Dorise Bullion III M.D.   On: 05/14/2022 09:16   CT Cervical Spine Wo Contrast  Result Date: 05/14/2022 CLINICAL DATA:  Moderate to severe head trauma.  Multiple falls. EXAM: CT HEAD WITHOUT CONTRAST CT CERVICAL SPINE WITHOUT CONTRAST TECHNIQUE: Multidetector CT imaging of the head and cervical spine was performed following the standard protocol without intravenous contrast. Multiplanar CT image reconstructions of the cervical spine were also generated. RADIATION DOSE REDUCTION: This exam was performed according to the departmental dose-optimization program which includes automated exposure control, adjustment of the mA and/or kV according to patient size and/or use of iterative reconstruction technique. COMPARISON:  CT scan of the brain and cervical spine May 09, 2022. CT scan of the brain and cervical spine February 15, 2022. FINDINGS: CT HEAD FINDINGS Brain: There is increasing low-density fluid over the convexities within the subdural space bilaterally. The maximum thickness of this fluid on the right is seen on coronal image  31 of series 8 measuring 11 mm in thickness today versus 9 mm previously. The low-attenuation fluid in the left subdural space is also increased but is less prominent compared to the right. There is mild effacement of underlying sulci. No midline shift. The cerebellum, brainstem, and basal cisterns are normal. No acute cortical ischemia or infarct. Vascular: No hyperdense vessel or unexpected calcification. Skull: Normal. Negative for fracture or focal lesion. Sinuses/Orbits: No acute finding. Other: Soft tissue swelling over the left forehead. CT CERVICAL SPINE FINDINGS Alignment: Reversal of normal lordosis. Skull base and vertebrae: Prior left laminectomies at C4 and C5. No acute fractures. Soft tissues and spinal canal: No prevertebral fluid or swelling. No visible canal hematoma. Disc levels: Multilevel degenerative disc disease and facet degenerative changes. Upper chest: Negative. Other: No other abnormalities IMPRESSION: 1. Low density subdural fluid collections bilaterally. The thickness of the low-density fluid on the right has increased from 9 mm on May 09, 2022 to 11 mm on today's study. The left-sided subdural low-density fluid has also increased in the interval from 4 mm on series 4, image 33 on the May 15 study to 8 mm today. The findings are consistent with subdural hygromas, increased in the interval. Recommend clinical correlation. 2. Soft tissue swelling over the left forehead. 3. No fracture or traumatic malalignment in the cervical spine. Degenerative changes as above. Electronically Signed   By: Dorise Bullion III M.D.   On: 05/14/2022 09:16   DG Hip Unilat W or Wo Pelvis 2-3 Views Left  Result Date: 05/14/2022 CLINICAL DATA:  Multiple falls, pain EXAM: DG HIP (WITH OR WITHOUT PELVIS) 2-3V LEFT COMPARISON:  None Available. FINDINGS: No recent fracture or dislocation is seen. Degenerative changes are noted in both hips, more so on the left side. IMPRESSION: No fracture or dislocation is  seen in the pelvis and left hip. Degenerative changes are noted in both hips, more so in the left hip. Electronically Signed   By: Elmer Picker M.D.   On: 05/14/2022 09:09    EKG: Independently reviewed.  NSR with rate 76; nonspecific ST changes with no evidence of acute ischemia   Labs on Admission: I  have personally reviewed the available labs and imaging studies at the time of the admission.  Pertinent labs:   wbc: 10.7 hgb: 11.9,  Assessment and Plan: Principal Problem:   Ambulatory dysfunction with frequent falls likely secondary to progressing PSP Active Problems:   Dementia without behavioral disturbance (HCC)   Normocytic anemia   Diabetes (HCC)   Pulmonary nodule   Benign prostatic hyperplasia without lower urinary tract symptoms   Insomnia   Falls   Progressive supranuclear palsy (HCC)   Subdural hygroma   Localized swelling of both lower legs   GERD (gastroesophageal reflux disease)    Assessment and Plan: * Ambulatory dysfunction with frequent falls likely secondary to progressing PSP 66 year old male with history of progressive supranuclear palsy with frequent falls who was seen for another fall found to have progressive decline in overall function/ambulation and unable to ambulate without max assist. Concern that he is not safe to return home alone.  -obs to medical telemetry (watch rhythm to make sure not contributing to falls) -falls a hallmark of PSP and thought to be cause of frequent falls and decline. Per neurology note recommended higher level of care which family has been working on getting him placed; however, he was unsafe to return home alone today -check orthostatics -hold his gabapentin, could be contributing -TSH  -PT/OT has been consulted. Brother states he has been receiving PT with no benefit  -SW for placement  -fall precautions   Dementia without behavioral disturbance ([place]) Seen by his neurologist who recommended higher level of  care SW consulted and hopes to get him placed delirium precautions   Normocytic anemia Check iron studies Hx of b12 deficiency, will check although would expect a more macrocytic picture Trend   Diabetes ([place]) Recent A1C of 6.6 Hold glipizide, start SSI and accucheks per protocol Consider different oral agent to prevent hypoglycemic events that could contribute to falls   Pulmonary nodule Following with Dr. Valeta Harms.  Imaging was concerning for potential malignancy.  They are going to repeat imaging in 3 months from March, 2023-scheduled for May 26th   GERD (gastroesophageal reflux disease) Continue protonix   Localized swelling of both lower legs Likely secondary to venous stasis and sedentary lifestyle  Can't put on TED hose himself, will order while here  Subdural hygroma Subdural hygromas have increase din interval; however, neurosurgery did not fell these were clinically significant and does not require further work up.   Insomnia Continue remeron, has been referred to see Dr. Annamaria Boots   Benign prostatic hyperplasia without lower urinary tract symptoms Continue flomax     Advance Care Planning:   Code Status: Full Code   Consults: PT/OT/SW  DVT Prophylaxis: SCD/TED  Family Communication: discussed with brother, POA, on phone: Brandon Vargas: [phone]  Severity of Illness: The appropriate patient status for this patient is OBSERVATION. Observation status is judged to be reasonable and necessary in order to provide the required intensity of service to ensure the patient's safety. The patient's presenting symptoms, physical exam findings, and initial radiographic and laboratory data in the context of their medical condition is felt to place them at decreased risk for further clinical deterioration. Furthermore, it is anticipated that the patient will be medically stable for discharge from the hospital within 2 midnights of admission.   Author: Orma Flaming, MD 05/14/2022  6:03 PM  For on call review [email or web address].

## 2022-05-14 NOTE — Assessment & Plan Note (Addendum)
Recent A1c 6.6%. -Discontinue glipizide due to risk of hypoglycemia -Start low-dose metformin -Liberate diet

## 2022-05-14 NOTE — ED Notes (Signed)
Walked by pt's room and saw that pt had climbed out of bed and was attempting to urinate in an urinal. Pt urinated all over the floor. Assited pt back into bed and requested that Pt used call light when needing assistance to urinate.

## 2022-05-14 NOTE — TOC Progression Note (Signed)
Transition of Care [facility]) - Progression Note    Patient Details  Name: Brandon Vargas MRN: [id number] Date of Birth: [birth year]  [facility] of [facility] CM/SW [place], [place] Phone Number: 05/14/2022, 8:43 AM  Clinical Narrative:    [facility] CSW spoke with [facility], attending provider, and RN regarding patient's disposition plan. Initial plan concluded for HHPT and social work to assist transfer to SNF if needed. CSW spoke with patient's brother, his 36, and per brother patient was being evaluated Monday by PT already and has plans to transfer to an assisted living facility on Wednesday. Brother requested a copy of the HHPT order if needed at the assisted living facility. Patient is not aware of this and brother is requesting for [place] not to tell him at this time.         Expected Discharge Plan and Services                                                 Social Determinants of Health (SDOH) Interventions    Readmission Risk Interventions     View : No data to display.

## 2022-05-14 NOTE — TOC Initial Note (Signed)
Transition of Care [facility]) - Initial/Assessment Note    Patient Details  Name: Brandon Vargas MRN: [id number] Date of Birth: 09-27-1956  [facility] CM/SW Contact:    Verdell Carmine, RN Phone Number: 05/14/2022, 8:39 AM  Clinical Narrative:                  Patient is from [facility]. He is being worked up by his PCP for placement. He currently has Loss adjuster, chartered, PT needs CSW.  Ca;;ed [place] and spoke to [facility] who states they do not have in house staff and we need to go through [facility] agency.  Spoke to patients brother Gene. The patient has a Therapist, sports and PT. He still needs a wheelchair. He is going to assisted living on Wed, however the patient is not aware of this yet, so he will have round the clock care. The RN and PT are currently private pay.   Wheelchair ordered for patient. And will have it delivered to his current residence via [employer]     Patient Goals and CMS Choice        Expected Discharge Plan and Services                                                Prior Living Arrangements/Services                       Activities of Daily Living      Permission Sought/Granted                  Emotional Assessment              Admission diagnosis:  Fall Patient Active Problem List   Diagnosis Date Noted   Irregular sleep-wake rhythm 03/24/2022   Falls 02/22/2022   Lumbar transverse process fracture ([place]) 02/22/2022   Pulmonary nodule 02/22/2022   Diabetes ([place]) 09/29/2021   Vitamin B12 deficiency 09/29/2021   Neurocognitive disorder 09/29/2021   Insomnia 09/29/2021   Abnormality of gait 11/02/2018   Balance problem 11/02/2018   Elevated PSA 04/09/2018   Dermatophytosis of groin 12/24/2017   Hyperglycemia 05/29/2017   IFG (impaired fasting glucose) 05/29/2017   Anxiety 04/17/2017   Benign prostatic hyperplasia without lower urinary tract symptoms 04/17/2017   PCP:  [place], Raymond J,  MD Pharmacy:   [id number] - [place], [place] - [address] [place] [address] Phone: [phone] Fax: [phone]  [facility] - [address], [place] [address] Phone: [phone] Fax: [phone]  [facility] [place] [address] Phone: [phone] Fax: [phone]     Social Determinants of Health (SDOH) Interventions    Readmission Risk Interventions     View : No data to display.

## 2022-05-14 NOTE — Assessment & Plan Note (Addendum)
Following with Dr. Valeta Harms.  Imaging was concerning for potential malignancy.   -Outpatient follow-up with pulmonology

## 2022-05-14 NOTE — Assessment & Plan Note (Addendum)
Seen by his neurologist who recommended higher level of care.  He has recurrent fall with injuries including head laceration and bruises.  He is only oriented to self, his date of birth and place -Delirium precaution -Avoid or minimize sedating medications

## 2022-05-14 NOTE — ED Provider Notes (Addendum)
[facility] Provider Note   CSN: [id number] Arrival date & time: 05/14/22  6568     History  Chief Complaint  Patient presents with   Brandon Vargas is a 66 y.o. male.  Presented to the ER due to concern for fall.  Patient reports that he has had frequent falls lately.  Most recent fall was this morning.  States that he was unsteady/imbalanced and fell into his television.  He does not think that he hit his head but is unsure.  Does report hitting his left hip and left elbow.  Is having pain at both the left hip and elbow.  Mild to moderate.  Worse with movement and improved with rest.  Reviewed chart, found to have likely small SDH.  Also per chart review patient has been in contact with his primary care doctor regarding placement.  Per PCP note patient has history of diabetes as well as a neurocognitive disorder and patient has had issues with ambulation and risk of falling, per that note he was supposed to move into assisted living.  HPI     Home Medications Prior to Admission medications   Medication Sig Start Date End Date Taking? Authorizing Provider  ACCU-CHEK GUIDE test strip  05/15/20   [provider]  acetaminophen (TYLENOL) 325 MG tablet Take 650 mg by mouth every 6 (six) hours as needed.    [provider]  AMBULATORY NON FORMULARY MEDICATION DISCONTINUE CARBIDOPA LEVODOPA 04/28/22   Tat, Eustace Quail, DO  calcium carbonate (TUMS - DOSED IN MG ELEMENTAL CALCIUM) 500 MG chewable tablet Chew 2 tablets by mouth daily as needed for indigestion or heartburn.    [provider]  Cholecalciferol (VITAMIN D3) 25 MCG (1000 UT) CAPS Take 1 capsule (1,000 Units total) by mouth daily. 12/03/21   de [place], Blondell Reveal, MD  cyclobenzaprine (FLEXERIL) 5 MG tablet Take 1 tablet (5 mg total) by mouth at bedtime as needed for muscle spasms. 02/01/22   [place], Blondell Reveal, MD  diclofenac Sodium (VOLTAREN) 1 % GEL Apply 2 g topically 4 (four)  times daily. To affected joint. 05/05/22   [place], Raymond J, MD  donepezil (ARICEPT) 10 MG tablet Take 1 tablet (10 mg total) by mouth daily. 12/06/21   [place], Raymond J, MD  gabapentin (NEURONTIN) 300 MG capsule Take 300 mg by mouth 3 (three) times daily.    [provider]  glipiZIDE (GLUCOTROL XL) 10 MG 24 hr tablet Take 1 tablet (10 mg total) by mouth daily. 05/11/22   Orma Render, NP  mirtazapine (REMERON) 7.5 MG tablet Take 7.5 mg by mouth at bedtime. 09/02/21   [provider]  pantoprazole (PROTONIX) 40 MG tablet Take 1 tablet (40 mg total) by mouth daily. 02/22/22   [place], Blondell Reveal, MD  tamsulosin (FLOMAX) 0.4 MG CAPS capsule Take 0.4 mg by mouth daily.    [provider]      Allergies    Patient has no known allergies.    Review of Systems   Review of Systems  Constitutional:  Negative for chills and fever.  HENT:  Negative for ear pain and sore throat.   Eyes:  Negative for pain and visual disturbance.  Respiratory:  Negative for cough and shortness of breath.   Cardiovascular:  Negative for chest pain and palpitations.  Gastrointestinal:  Negative for abdominal pain and vomiting.  Genitourinary:  Negative for dysuria and hematuria.  Musculoskeletal:  Positive for arthralgias. Negative for back pain.  Skin:  Negative for color change and rash.  Neurological:  Negative for seizures and syncope.  All other systems reviewed and are negative.  Physical Exam Updated Vital Signs BP (!) 149/88   Pulse 78   Temp 98.1 F (36.7 C) (Oral)   Resp 17   Ht 6\' 1"  (1.854 m)   Wt 90.7 kg   SpO2 98%   BMI 26.39 kg/m  Physical Exam Vitals and nursing note reviewed.  Constitutional:      General: He is not in acute distress.    Appearance: He is well-developed.  HENT:     Head: Normocephalic and atraumatic.  Eyes:     Conjunctiva/sclera: Conjunctivae normal.  Cardiovascular:     Rate and Rhythm: Normal rate and regular rhythm.     Heart sounds:  No murmur heard. Pulmonary:     Effort: Pulmonary effort is normal. No respiratory distress.     Breath sounds: Normal breath sounds.  Abdominal:     Palpations: Abdomen is soft.     Tenderness: There is no abdominal tenderness.  Musculoskeletal:     Cervical back: Neck supple.     Comments: Left upper extremity: There is some tenderness to the left elbow but no significant deformity Left lower extremity: There is some tenderness to the hip but again no significant deformity Back: There is no tenderness along the T or L-spine  Skin:    General: Skin is warm and dry.     Capillary Refill: Capillary refill takes less than 2 seconds.     Comments: Multiple scattered healing abrasions and bruises over arms and legs  Neurological:     Mental Status: He is alert.  Psychiatric:        Mood and Affect: Mood normal.    ED Results / Procedures / Treatments   Labs (all labs ordered are listed, but only abnormal results are displayed) Labs Reviewed  URINALYSIS, ROUTINE W REFLEX MICROSCOPIC - Abnormal; Notable for the following components:      Result Value   Protein, ur TRACE (*)    All other components within normal limits  CBC WITH DIFFERENTIAL/PLATELET - Abnormal; Notable for the following components:   WBC 10.7 (*)    Hemoglobin 11.9 (*)    HCT 36.8 (*)    Neutro Abs 8.9 (*)    All other components within normal limits  COMPREHENSIVE METABOLIC PANEL - Abnormal; Notable for the following components:   Glucose, Bld 105 (*)    Calcium 8.7 (*)    All other components within normal limits    EKG EKG Interpretation  Date/Time:  Saturday May 14 2022 08:23:31 EDT Ventricular Rate:  76 PR Interval:  202 QRS Duration: 87 QT Interval:  391 QTC Calculation: 440 R Axis:   4 Text Interpretation: Sinus rhythm Confirmed by Madalyn Rob [id number]) on 05/14/2022 8:31:15 AM  Radiology DG Chest 1 View  Result Date: 05/14/2022 CLINICAL DATA:  Multiple falls, pain EXAM: CHEST  1 VIEW  COMPARISON:  Previous studies including the examination of 06/06/2017 FINDINGS: Cardiac size is within normal limits. There are no signs of pulmonary edema or focal pulmonary consolidation. There is no pleural effusion or pneumothorax. Deformities are noted in the left seventh, eighth and ninth ribs suggesting possible healed fractures. IMPRESSION: No active disease. Electronically Signed   By: Elmer Picker M.D.   On: 05/14/2022 09:10   DG Elbow Complete Left  Result Date: 05/14/2022 CLINICAL DATA:  Multiple falls, pain EXAM: LEFT ELBOW - COMPLETE 3+ VIEW COMPARISON:  03/15/2022 FINDINGS: No recent fracture or dislocation is seen. There are tiny submillimeter calcific density along the medial and lateral aspect of distal humerus possibly residual calcifications from previous injury. This finding has not changed. There is no displacement of posterior fat pad. There is soft tissue swelling and subcutaneous stranding along the posterior aspect suggesting contusion/hematoma. IMPRESSION: No recent fracture or dislocation is seen in the left elbow. Electronically Signed   By: Elmer Picker M.D.   On: 05/14/2022 09:07   CT Head Wo Contrast  Result Date: 05/14/2022 CLINICAL DATA:  Moderate to severe head trauma.  Multiple falls. EXAM: CT HEAD WITHOUT CONTRAST CT CERVICAL SPINE WITHOUT CONTRAST TECHNIQUE: Multidetector CT imaging of the head and cervical spine was performed following the standard protocol without intravenous contrast. Multiplanar CT image reconstructions of the cervical spine were also generated. RADIATION DOSE REDUCTION: This exam was performed according to the departmental dose-optimization program which includes automated exposure control, adjustment of the mA and/or kV according to patient size and/or use of iterative reconstruction technique. COMPARISON:  CT scan of the brain and cervical spine May 09, 2022. CT scan of the brain and cervical spine February 15, 2022. FINDINGS: CT  HEAD FINDINGS Brain: There is increasing low-density fluid over the convexities within the subdural space bilaterally. The maximum thickness of this fluid on the right is seen on coronal image 31 of series 8 measuring 11 mm in thickness today versus 9 mm previously. The low-attenuation fluid in the left subdural space is also increased but is less prominent compared to the right. There is mild effacement of underlying sulci. No midline shift. The cerebellum, brainstem, and basal cisterns are normal. No acute cortical ischemia or infarct. Vascular: No hyperdense vessel or unexpected calcification. Skull: Normal. Negative for fracture or focal lesion. Sinuses/Orbits: No acute finding. Other: Soft tissue swelling over the left forehead. CT CERVICAL SPINE FINDINGS Alignment: Reversal of normal lordosis. Skull base and vertebrae: Prior left laminectomies at C4 and C5. No acute fractures. Soft tissues and spinal canal: No prevertebral fluid or swelling. No visible canal hematoma. Disc levels: Multilevel degenerative disc disease and facet degenerative changes. Upper chest: Negative. Other: No other abnormalities IMPRESSION: 1. Low density subdural fluid collections bilaterally. The thickness of the low-density fluid on the right has increased from 9 mm on May 09, 2022 to 11 mm on today's study. The left-sided subdural low-density fluid has also increased in the interval from 4 mm on series 4, image 33 on the May 15 study to 8 mm today. The findings are consistent with subdural hygromas, increased in the interval. Recommend clinical correlation. 2. Soft tissue swelling over the left forehead. 3. No fracture or traumatic malalignment in the cervical spine. Degenerative changes as above. Electronically Signed   By: Dorise Bullion III M.D.   On: 05/14/2022 09:16   CT Cervical Spine Wo Contrast  Result Date: 05/14/2022 CLINICAL DATA:  Moderate to severe head trauma.  Multiple falls. EXAM: CT HEAD WITHOUT CONTRAST CT  CERVICAL SPINE WITHOUT CONTRAST TECHNIQUE: Multidetector CT imaging of the head and cervical spine was performed following the standard protocol without intravenous contrast. Multiplanar CT image reconstructions of the cervical spine were also generated. RADIATION DOSE REDUCTION: This exam was performed according to the departmental dose-optimization program which includes automated exposure control, adjustment of the mA and/or kV according to patient size and/or use of iterative reconstruction technique. COMPARISON:  CT scan of the brain  and cervical spine May 09, 2022. CT scan of the brain and cervical spine February 15, 2022. FINDINGS: CT HEAD FINDINGS Brain: There is increasing low-density fluid over the convexities within the subdural space bilaterally. The maximum thickness of this fluid on the right is seen on coronal image 31 of series 8 measuring 11 mm in thickness today versus 9 mm previously. The low-attenuation fluid in the left subdural space is also increased but is less prominent compared to the right. There is mild effacement of underlying sulci. No midline shift. The cerebellum, brainstem, and basal cisterns are normal. No acute cortical ischemia or infarct. Vascular: No hyperdense vessel or unexpected calcification. Skull: Normal. Negative for fracture or focal lesion. Sinuses/Orbits: No acute finding. Other: Soft tissue swelling over the left forehead. CT CERVICAL SPINE FINDINGS Alignment: Reversal of normal lordosis. Skull base and vertebrae: Prior left laminectomies at C4 and C5. No acute fractures. Soft tissues and spinal canal: No prevertebral fluid or swelling. No visible canal hematoma. Disc levels: Multilevel degenerative disc disease and facet degenerative changes. Upper chest: Negative. Other: No other abnormalities IMPRESSION: 1. Low density subdural fluid collections bilaterally. The thickness of the low-density fluid on the right has increased from 9 mm on May 09, 2022 to 11 mm on  today's study. The left-sided subdural low-density fluid has also increased in the interval from 4 mm on series 4, image 33 on the May 15 study to 8 mm today. The findings are consistent with subdural hygromas, increased in the interval. Recommend clinical correlation. 2. Soft tissue swelling over the left forehead. 3. No fracture or traumatic malalignment in the cervical spine. Degenerative changes as above. Electronically Signed   By: Dorise Bullion III M.D.   On: 05/14/2022 09:16   DG Hip Unilat W or Wo Pelvis 2-3 Views Left  Result Date: 05/14/2022 CLINICAL DATA:  Multiple falls, pain EXAM: DG HIP (WITH OR WITHOUT PELVIS) 2-3V LEFT COMPARISON:  None Available. FINDINGS: No recent fracture or dislocation is seen. Degenerative changes are noted in both hips, more so on the left side. IMPRESSION: No fracture or dislocation is seen in the pelvis and left hip. Degenerative changes are noted in both hips, more so in the left hip. Electronically Signed   By: Elmer Picker M.D.   On: 05/14/2022 09:09    Procedures Procedures    Medications Ordered in ED Medications - No data to display  ED Course/ Medical Decision Making/ A&P Clinical Course as of 05/14/22 1215  Sat May 14, 2022  1021 Discussed CT findings of increased hygromas with Dr. Cyndy Freeze - no significant clinical significance, does not need follow up with NSGY or further workup/tx for this finding, he does not believe this is contributing to the overall clinical picture [RD]    Clinical Course User Index [RD] Lucrezia Starch, MD                           Medical Decision Making Problems Addressed: Frequent falls: chronic illness or injury PSP (progressive supranuclear palsy) [facility]): chronic illness or injury Subdural hygroma: chronic illness or injury  Amount and/or Complexity of Data Reviewed External Data Reviewed: labs, radiology and notes. Labs: ordered. Radiology: ordered and independent interpretation  performed. ECG/medicine tests: ordered and independent interpretation performed. Discussion of management or test interpretation with external provider(s): Plan discussed with TOC SW and TOC CM who communicated with staff at patient's current facility  Risk Decision regarding hospitalization.   66 year old  gentleman presents to ER for fall.  Today patient reported hitting his left elbow and left hip.  On exam patient does have multiple scattered healing abrasions and bruises.  Checked CT of his head due to patient's uncertainty about whether or not he hit his head this morning and his recent SDH.  Additionally checked x-ray of elbow and hip.  I independently reviewed and interpreted the CT images as well as the x-rays.  Per radiology, and the CT scan demonstrated findings concerning for increasing hygroma bilaterally.  I reviewed these findings in detail with neurosurgery on-call.  He does not feel this is clinically significant and does not require any additional work-up or any specific follow-up with neurosurgery.  I completed extensive chart review including review of patient's recent PCP notes, neurology notes.  For the last few weeks they have been working on placement.  I consulted TOC.  Discussed case with the case Freight forwarder and Education officer, [employer].  They discussed with patient's brother.  Apparently patient is planning to go to a facility on Wednesday.  Did place order for home health PT in case this is needed for getting the PT eval done prior to going to the facility.  Patient has walker already.  His neurologist a few weeks ago had advised minimizing walking and utilizing wheelchair when possible.  TOC informing that they were able to arrange for wheelchair to be delivered to his current residence.  Based on my review of patient's chart, these issues with his mobility, frequent falls appear to be ongoing for weeks to months.  He appears well and has normal vital signs.  His basic labs are grossly stable,  no anemia or electrolyte derangement and no UTI.  Ekg shows sinus rhythm, no events on tele monitoring. Given these findings and the broader clinical picture, feel patient is stable to go back home at this time and does not require medical admission.  Reviewed return precautions and discharged.   Additional history was obtained from extensive chart review including review of neurology outpatient note on 5/4, PCP note on 5/11, social work notes, recent ER visits.  Patient has history of PSP, dementia.  Addendum -staff attempted ambulation trial however patient required significant assistance and became very unsteady after taking only a couple steps.  Concern regarding safety of patient's discharge plan to independent level facility.  I had lengthy conversation with both patient's son (who lives in [place]) as well as patient's brother (who lives in [place]).  Both of them state that they are unable to provide any assistance this weekend.   Due to patient's significant ambulation issues, difficulty walking, concern that this has progressed to point where patient is unsafe to go back to independent living without any extra help, will consult hospitalist service to see if patient would qualify for admission for further observation, working with physical therapy and placement.     Final Clinical Impression(s) / ED Diagnoses Final diagnoses:  Subdural hygroma  Frequent falls  PSP (progressive supranuclear palsy) ([place])    Rx / DC Orders ED Discharge Orders     None         Lucrezia Starch, MD 05/14/22 1033    Lucrezia Starch, MD 05/14/22 1215

## 2022-05-14 NOTE — Assessment & Plan Note (Signed)
Continue protonix  

## 2022-05-14 NOTE — Assessment & Plan Note (Signed)
Continue remeron, has been referred to see Dr. Annamaria Boots

## 2022-05-14 NOTE — Plan of Care (Signed)
  Problem: Health Behavior/Discharge Planning: Goal: Ability to manage health-related needs will improve Outcome: Progressing   Problem: Activity: Goal: Risk for activity intolerance will decrease Outcome: Progressing   Problem: Nutrition: Goal: Adequate nutrition will be maintained Outcome: Progressing   

## 2022-05-14 NOTE — ED Notes (Signed)
RT Note: Pt. taken to XRAY/CT prior to EKG being obtained.

## 2022-05-14 NOTE — Discharge Instructions (Addendum)
Follow-up with home health, your neurologist and your primary care doctor.  Come back to ER as needed for any additional falls or other new concerning symptoms.

## 2022-05-14 NOTE — Assessment & Plan Note (Deleted)
Likely secondary to venous stasis and sedentary lifestyle  Can't put on TED hose himself, will order while here

## 2022-05-14 NOTE — ED Notes (Signed)
Pt attempted to get out of bed. Pt changed, bed cleaned with new sheets. Pt used urinal. Food and drink given.

## 2022-05-14 NOTE — ED Notes (Signed)
RT Note: EKG obtained after pt. returned from [place], [place] aware.

## 2022-05-14 NOTE — ED Notes (Addendum)
Report called to [facility] °

## 2022-05-14 NOTE — ED Notes (Signed)
Pt having trouble ambulating with assistance. MD notified

## 2022-05-14 NOTE — Assessment & Plan Note (Signed)
Continue flomax

## 2022-05-15 ENCOUNTER — Encounter (HOSPITAL_COMMUNITY): Payer: Self-pay | Admitting: Family Medicine

## 2022-05-15 DIAGNOSIS — R262 Difficulty in walking, not elsewhere classified: Secondary | ICD-10-CM | POA: Diagnosis not present

## 2022-05-15 LAB — CBC
HCT: 35.3 % — ABNORMAL LOW (ref 39.0–52.0)
Hemoglobin: 11.4 g/dL — ABNORMAL LOW (ref 13.0–17.0)
MCH: 27.5 pg (ref 26.0–34.0)
MCHC: 32.3 g/dL (ref 30.0–36.0)
MCV: 85.1 fL (ref 80.0–100.0)
Platelets: 273 10*3/uL (ref 150–400)
RBC: 4.15 MIL/uL — ABNORMAL LOW (ref 4.22–5.81)
RDW: 13.7 % (ref 11.5–15.5)
WBC: 7.6 10*3/uL (ref 4.0–10.5)
nRBC: 0 % (ref 0.0–0.2)

## 2022-05-15 LAB — BASIC METABOLIC PANEL
Anion gap: 7 (ref 5–15)
BUN: 13 mg/dL (ref 8–23)
CO2: 25 mmol/L (ref 22–32)
Calcium: 8.8 mg/dL — ABNORMAL LOW (ref 8.9–10.3)
Chloride: 111 mmol/L (ref 98–111)
Creatinine, Ser: 0.84 mg/dL (ref 0.61–1.24)
GFR, Estimated: >60 mL/min (ref >60)
Glucose, Bld: 109 mg/dL — ABNORMAL HIGH (ref 70–99)
Potassium: 4.3 mmol/L (ref 3.5–5.1)
Sodium: 143 mmol/L (ref 135–145)

## 2022-05-15 LAB — GLUCOSE, CAPILLARY
Glucose-Capillary: 47 mg/dL — ABNORMAL LOW (ref 70–99)
Glucose-Capillary: 63 mg/dL — ABNORMAL LOW (ref 70–99)
Glucose-Capillary: 143 mg/dL — ABNORMAL HIGH (ref 70–99)
Glucose-Capillary: 89 mg/dL (ref 70–99)
Glucose-Capillary: 103 mg/dL — ABNORMAL HIGH (ref 70–99)
Glucose-Capillary: 144 mg/dL — ABNORMAL HIGH (ref 70–99)

## 2022-05-15 LAB — HIV ANTIBODY (ROUTINE TESTING W REFLEX): HIV Screen 4th Generation wRfx: NONREACTIVE

## 2022-05-15 NOTE — Plan of Care (Signed)
  Problem: Activity: Goal: Risk for activity intolerance will decrease Outcome: Progressing   Problem: Nutrition: Goal: Adequate nutrition will be maintained Outcome: Progressing   Problem: Coping: Goal: Level of anxiety will decrease Outcome: Progressing   

## 2022-05-15 NOTE — Evaluation (Signed)
Occupational Therapy Evaluation Patient Details Name: Brandon Vargas MRN: [id number] DOB: 06-12-1956 Today's Date: 05/15/2022   History of Present Illness Brandon Vargas is a 66 y.o. male with medical history significant of  T2DM, insomnia, progressive supranuclear palsy with repetitive falls, dementia, lung nodule followed by pulmonology who presented to ED with complaints of fall this AM. He states he fell down about 4 times and he had a hard time getting up   Clinical Impression   Brandon Vargas is a 66 year old man who lives alone in an independent living facility who presents with recurrent falls dues to progressive supranuclear palsy. He is modified independent with a rolator at home, mostly independent with ADLs - he has assistance with baths and medication. On evaluation he demonstrates good upper body strength. His grip strength is mildly weaker and has decreased but still functional fine motor coordination of bilateral hands. He was supervision to transfer to edge of bed, min guard to stand and ambulate in hall with RW. He can perform most ADLS in seated position - able to don sock with difficulty. During evaluation he had no overt loss of balance. He was able to stand and take hands off of walker as he would for clothing management during ADLs and resist anterior and posterior nudge in standing. He reports falling almost daily and he is riddled with bruises and fell getting out of hospital bed this morning. Will follow acutely to continue to assess patient and try to determine mechanism of falls in order to rehabilitate or learn compensatory strategies to reduce falls. Due to frequency of falls and progressive disease patient needs to learn to be modified independent at wheelchair level prior to returning home. Recommend discharge to SNF and would recommend family and patient pursue high level of care for near future.      Recommendations for follow up therapy are one component of a  multi-disciplinary discharge planning process, led by the attending physician.  Recommendations may be updated based on patient status, additional functional criteria and insurance authorization.   Follow Up Recommendations  SNF   Assistance Recommended at Discharge Frequent or constant Supervision/Assistance  Patient can return home with the following Direct supervision/assist for medications management;Direct supervision/assist for financial management;Assistance with cooking/housework;A little help with bathing/dressing/bathroom;A little help with walking and/or transfers    Functional Status Assessment     Equipment Recommendations  None recommended by OT    Recommendations for Other Services       Precautions / Restrictions Precautions Precautions: Fall Precaution Comments: Falls almost daily Restrictions Weight Bearing Restrictions: No      Mobility Bed Mobility Overal bed mobility: Needs Assistance Bed Mobility: Supine to Sit     Supine to sit: Supervision          Transfers Overall transfer level: Needs assistance Equipment used: Rolling walker (2 wheels) Transfers: Sit to/from Stand Sit to Stand: Min guard           General transfer comment: Min guard to ambulate in hall with RW. No overt loss of balance      Balance Overall balance assessment: Needs assistance Sitting-balance support: No upper extremity supported, Feet supported Sitting balance-Leahy Scale: Fair     Standing balance support: During functional activity Standing balance-Leahy Scale: Fair Standing balance comment: Able to take hands off of walker, able to resist anterior and posterior nudge, able to high step holding on to walker,  ADL either performed or assessed with clinical judgement   ADL Overall ADL's : Needs assistance/impaired Eating/Feeding: Independent   Grooming: Set up;Sitting   Upper Body Bathing: Set up;Sitting   Lower Body  Bathing: Set up;Min guard   Upper Body Dressing : Set up;Sitting   Lower Body Dressing: Min guard;Sit to/from stand   Toilet Transfer: Min guard;Rolling walker (2 wheels);Comfort height toilet   Toileting- Clothing Manipulation and Hygiene: Min guard;Sit to/from stand       Functional mobility during ADLs: Min guard;Rolling walker (2 wheels)       Vision Patient Visual Report: No change from baseline       Perception     Praxis      Pertinent Vitals/Pain Pain Assessment Pain Assessment: No/denies pain     Hand Dominance Right   Extremity/Trunk Assessment Upper Extremity Assessment Upper Extremity Assessment: RUE deficits/detail;LUE deficits/detail RUE Deficits / Details: WFL ROM; 5/5 shoulder, elbow 5/5, wrist 5/5, grip 4/5 RUE Coordination: decreased fine motor (functional coordination) LUE Deficits / Details: WFL ROM; 5/5 shoulder, elbow 5/5, wrist 5/5, grip 4/5 LUE Coordination: decreased fine motor (functional coordination)   Lower Extremity Assessment Lower Extremity Assessment: Defer to PT evaluation   Cervical / Trunk Assessment Cervical / Trunk Assessment: Neck Surgery;Other exceptions Cervical / Trunk Exceptions: cervical kyphosis   Communication Communication Communication: Expressive difficulties (tends to repeat what therapist says then answer the question)   Cognition Arousal/Alertness: Awake/alert Behavior During Therapy: Flat affect Overall Cognitive Status: Within Functional Limits for tasks assessed                                 General Comments: Mostly functional. Able to follow commands and answer PLOF questions appropriately. Has had speech in the past.     General Comments       Exercises     Shoulder Instructions      Home Living Family/patient expects to be discharged to:: Other (Comment)                                 Additional Comments: Independent Living      Prior Functioning/Environment  Prior Level of Function : Independent/Modified Independent             Mobility Comments: ambulates with rolator ADLs Comments: independent with ADLs, brother has someone come in and give him his medication and baths 3 x a week        OT Problem List: Impaired balance (sitting and/or standing);Decreased safety awareness;Decreased knowledge of use of DME or AE;Decreased cognition      OT Treatment/Interventions: Self-care/ADL training;DME and/or AE instruction;Therapeutic activities;Balance training;Patient/family education    OT Goals(Current goals can be found in the care plan section) Acute Rehab OT Goals Time For Goal Achievement: 05/29/22 Potential to Achieve Goals: Good  OT Frequency: Min 2X/week    Co-evaluation              AM-PAC OT "6 Clicks" Daily Activity     Outcome Measure Help from another person eating meals?: None Help from another person taking care of personal grooming?: A Little Help from another person toileting, which includes using toliet, bedpan, or urinal?: A Little Help from another person bathing (including washing, rinsing, drying)?: A Little Help from another person to put on and taking off regular upper body clothing?: A Little Help from another person  to put on and taking off regular lower body clothing?: A Little 6 Click Score: 19   End of Session Equipment Utilized During Treatment: Rolling walker (2 wheels);Gait belt Nurse Communication: Mobility status  Activity Tolerance: Patient tolerated treatment well Patient left: in chair;with call bell/phone within reach;with chair alarm set  OT Visit Diagnosis: Repeated falls (R29.6)                Time: 7255-0016 OT Time Calculation (min): 33 min Charges:  OT General Charges $OT Visit: 1 Visit OT Evaluation $OT Eval Low Complexity: 1 Low  Jenefer Woerner, OTR/L [place]  Office [phone] Pager: [place] 05/15/2022, 12:35 PM

## 2022-05-15 NOTE — Progress Notes (Addendum)
PROGRESS NOTE    Brandon Vargas  MEQ:[id number] DOB: 1956/06/20 DOA: 05/14/2022 PCP: de [place], Raymond J, MD   Brief Narrative: 66 year old with past medical history significant for diabetes type 2, insomnia, progressive supranuclear palsy with recurrent fall, dementia, lung nodule followed by Dr. Katy Fitch presents to the ED complaining of fall the morning of admission.  Patient reported that he has fall, about 4 times and he had a hard time getting up.  CT showed progressive subdural hygroma, neurosurgery was called and stated there was nothing to do regarding this.  Patient was going to be discharged from the ED, however when the staff got him to ambulate he required max assist and became very unsteady with just a few steps.  Patient was admitted for placement.   Assessment & Plan:   Principal Problem:   Ambulatory dysfunction with frequent falls likely secondary to progressing PSP Active Problems:   Dementia without behavioral disturbance (HCC)   Normocytic anemia   Diabetes (HCC)   Pulmonary nodule   Benign prostatic hyperplasia without lower urinary tract symptoms   Insomnia   Falls   Progressive supranuclear palsy (HCC)   Subdural hygroma   Localized swelling of both lower legs   GERD (gastroesophageal reflux disease)   1-Ambulatory dysfunction with frequent fall, likely secondary to progressive PSP -Patient with progressive decline in overall function is/ambulation and unable to ambulate without max assist.  He is not safe to return home alone. -Likely related to progression of PSP -PT OT consulted -Orthostatic vitals: PT will check orthostatic  -Gabapentin on hold,  thought this  might be contributing -TSH: -Fall precaution -Needs placement   2-Dementia without behavioral disturbance: -Delirium precaution -Needs higher  level of care  3-Normocytic anemia: of chronic diseases.  Anemia panel: Ferritin 147, iron 52, Sat ratio: 16, B 12 336.    Diabetes type 2:  Hypoglycemia.  -Recent A1c 6.6 -Hold glipizide, will need different medication with less risk for hypoglycemia.  -Agree with considering a different agent to prevent hypoglycemia. -Sliding scale insulin while in the hospital   Pulmonary nodule: -Follow-up with Dr. Katy Fitch. -Plan to repeat imaging in 3 months, as scheduled for May 26.  GERD: Continue with Protonix.  Bilateral lower extremity edema: TED hose compression.  Subdural hygroma: -Subdural hygroma increase in interval.  However neurosurgery did not feel these were of clinically significant and does not require further work-up.  Insomnia: -Continue with Remeron.   Benign prostatic hyperplasia without lower urinary tract symptoms -Continue with Flomax.     Estimated body mass index is 26.39 kg/m as calculated from the following:   Height as of this encounter: 6\' 1"  (1.854 m).   Weight as of this encounter: 90.7 kg.   DVT prophylaxis: SCD Code Status: Full code Family Communication: Disposition Plan:  Status is: Observation The patient remains OBS appropriate and will d/c before 2 midnights.    Consultants:  Neurosurgery consulted by ED, phone consultation.   Procedures:  None  Antimicrobials:    Subjective: He is alert, report weakness if worse legs.  He is ready to work with PT.    Objective: Vitals:   05/14/22 1700 05/14/22 1900 05/15/22 0110 05/15/22 0414  BP: 116/62 128/64 137/76 120/76  Pulse: 64 82 63 (!) 54  Resp: 16 17 (!) 29 18  Temp: 98.4 F (36.9 C) 98.7 F (37.1 C) 98.3 F (36.8 C) 97.8 F (36.6 C)  TempSrc: Oral Oral Oral Oral  SpO2: 100%   99%  Weight:  Height:        Intake/Output Summary (Last 24 hours) at 05/15/2022 0727 Last data filed at 05/14/2022 1830 Gross per 24 hour  Intake 140 ml  Output 100 ml  Net 40 ml   Filed Weights   05/14/22 0625  Weight: 90.7 kg    Examination:  General exam: Appears calm and comfortable  Respiratory system: Clear to  auscultation. Respiratory effort normal. Cardiovascular system: S1 & S2 heard, RRR.  Gastrointestinal system: Abdomen is nondistended, soft and nontender. No organomegaly or masses felt. Normal bowel sounds heard. Central nervous system: Alert and oriented. Follows command no focal weakness.  Extremities:    Data Reviewed: I have personally reviewed following labs and imaging studies  CBC: Recent Labs  Lab 05/14/22 0740 05/15/22 0327  WBC 10.7* 7.6  NEUTROABS 8.9*  --   HGB 11.9* 11.4*  HCT 36.8* 35.3*  MCV 83.6 85.1  PLT 297 622   Basic Metabolic Panel: Recent Labs  Lab 05/14/22 0740 05/15/22 0327  NA 141 143  K 4.1 4.3  CL 105 111  CO2 27 25  GLUCOSE 105* 109*  BUN 18 13  CREATININE 0.88 0.84  CALCIUM 8.7* 8.8*   GFR: Estimated Creatinine Clearance: 95.1 mL/min (by C-G formula based on SCr of 0.84 mg/dL). Liver Function Tests: Recent Labs  Lab 05/14/22 0740  AST 21  ALT 22  ALKPHOS 69  BILITOT 0.9  PROT 6.5  ALBUMIN 3.7   No results for input(s): LIPASE, AMYLASE in the last 168 hours. No results for input(s): AMMONIA in the last 168 hours. Coagulation Profile: No results for input(s): INR, PROTIME in the last 168 hours. Cardiac Enzymes: No results for input(s): CKTOTAL, CKMB, CKMBINDEX, TROPONINI in the last 168 hours. BNP (last 3 results) No results for input(s): PROBNP in the last 8760 hours. HbA1C: No results for input(s): HGBA1C in the last 72 hours. CBG: Recent Labs  Lab 05/14/22 1401 05/14/22 1750 05/14/22 2025  GLUCAP 74 96 95   Lipid Profile: No results for input(s): CHOL, HDL, LDLCALC, TRIG, CHOLHDL, LDLDIRECT in the last 72 hours. Thyroid Function Tests: Recent Labs    05/14/22 1807  TSH 1.131   Anemia Panel: Recent Labs    05/14/22 1807  VITAMINB12 336  FERRITIN 147  TIBC 317  IRON 52   Sepsis Labs: No results for input(s): PROCALCITON, LATICACIDVEN in the last 168 hours.  No results found for this or any previous  visit (from the past 240 hour(s)).       Radiology Studies: DG Chest 1 View  Result Date: 05/14/2022 CLINICAL DATA:  Multiple falls, pain EXAM: CHEST  1 VIEW COMPARISON:  Previous studies including the examination of 06/06/2017 FINDINGS: Cardiac size is within normal limits. There are no signs of pulmonary edema or focal pulmonary consolidation. There is no pleural effusion or pneumothorax. Deformities are noted in the left seventh, eighth and ninth ribs suggesting possible healed fractures. IMPRESSION: No active disease. Electronically Signed   By: Elmer Picker M.D.   On: 05/14/2022 09:10   DG Elbow Complete Left  Result Date: 05/14/2022 CLINICAL DATA:  Multiple falls, pain EXAM: LEFT ELBOW - COMPLETE 3+ VIEW COMPARISON:  03/15/2022 FINDINGS: No recent fracture or dislocation is seen. There are tiny submillimeter calcific density along the medial and lateral aspect of distal humerus possibly residual calcifications from previous injury. This finding has not changed. There is no displacement of posterior fat pad. There is soft tissue swelling and subcutaneous stranding along the posterior aspect  suggesting contusion/hematoma. IMPRESSION: No recent fracture or dislocation is seen in the left elbow. Electronically Signed   By: Elmer Picker M.D.   On: 05/14/2022 09:07   CT Head Wo Contrast  Result Date: 05/14/2022 CLINICAL DATA:  Moderate to severe head trauma.  Multiple falls. EXAM: CT HEAD WITHOUT CONTRAST CT CERVICAL SPINE WITHOUT CONTRAST TECHNIQUE: Multidetector CT imaging of the head and cervical spine was performed following the standard protocol without intravenous contrast. Multiplanar CT image reconstructions of the cervical spine were also generated. RADIATION DOSE REDUCTION: This exam was performed according to the departmental dose-optimization program which includes automated exposure control, adjustment of the mA and/or kV according to patient size and/or use of iterative  reconstruction technique. COMPARISON:  CT scan of the brain and cervical spine May 09, 2022. CT scan of the brain and cervical spine February 15, 2022. FINDINGS: CT HEAD FINDINGS Brain: There is increasing low-density fluid over the convexities within the subdural space bilaterally. The maximum thickness of this fluid on the right is seen on coronal image 31 of series 8 measuring 11 mm in thickness today versus 9 mm previously. The low-attenuation fluid in the left subdural space is also increased but is less prominent compared to the right. There is mild effacement of underlying sulci. No midline shift. The cerebellum, brainstem, and basal cisterns are normal. No acute cortical ischemia or infarct. Vascular: No hyperdense vessel or unexpected calcification. Skull: Normal. Negative for fracture or focal lesion. Sinuses/Orbits: No acute finding. Other: Soft tissue swelling over the left forehead. CT CERVICAL SPINE FINDINGS Alignment: Reversal of normal lordosis. Skull base and vertebrae: Prior left laminectomies at C4 and C5. No acute fractures. Soft tissues and spinal canal: No prevertebral fluid or swelling. No visible canal hematoma. Disc levels: Multilevel degenerative disc disease and facet degenerative changes. Upper chest: Negative. Other: No other abnormalities IMPRESSION: 1. Low density subdural fluid collections bilaterally. The thickness of the low-density fluid on the right has increased from 9 mm on May 09, 2022 to 11 mm on today's study. The left-sided subdural low-density fluid has also increased in the interval from 4 mm on series 4, image 33 on the May 15 study to 8 mm today. The findings are consistent with subdural hygromas, increased in the interval. Recommend clinical correlation. 2. Soft tissue swelling over the left forehead. 3. No fracture or traumatic malalignment in the cervical spine. Degenerative changes as above. Electronically Signed   By: Dorise Bullion III M.D.   On: 05/14/2022 09:16    CT Cervical Spine Wo Contrast  Result Date: 05/14/2022 CLINICAL DATA:  Moderate to severe head trauma.  Multiple falls. EXAM: CT HEAD WITHOUT CONTRAST CT CERVICAL SPINE WITHOUT CONTRAST TECHNIQUE: Multidetector CT imaging of the head and cervical spine was performed following the standard protocol without intravenous contrast. Multiplanar CT image reconstructions of the cervical spine were also generated. RADIATION DOSE REDUCTION: This exam was performed according to the departmental dose-optimization program which includes automated exposure control, adjustment of the mA and/or kV according to patient size and/or use of iterative reconstruction technique. COMPARISON:  CT scan of the brain and cervical spine May 09, 2022. CT scan of the brain and cervical spine February 15, 2022. FINDINGS: CT HEAD FINDINGS Brain: There is increasing low-density fluid over the convexities within the subdural space bilaterally. The maximum thickness of this fluid on the right is seen on coronal image 31 of series 8 measuring 11 mm in thickness today versus 9 mm previously. The low-attenuation fluid in  the left subdural space is also increased but is less prominent compared to the right. There is mild effacement of underlying sulci. No midline shift. The cerebellum, brainstem, and basal cisterns are normal. No acute cortical ischemia or infarct. Vascular: No hyperdense vessel or unexpected calcification. Skull: Normal. Negative for fracture or focal lesion. Sinuses/Orbits: No acute finding. Other: Soft tissue swelling over the left forehead. CT CERVICAL SPINE FINDINGS Alignment: Reversal of normal lordosis. Skull base and vertebrae: Prior left laminectomies at C4 and C5. No acute fractures. Soft tissues and spinal canal: No prevertebral fluid or swelling. No visible canal hematoma. Disc levels: Multilevel degenerative disc disease and facet degenerative changes. Upper chest: Negative. Other: No other abnormalities IMPRESSION: 1.  Low density subdural fluid collections bilaterally. The thickness of the low-density fluid on the right has increased from 9 mm on May 09, 2022 to 11 mm on today's study. The left-sided subdural low-density fluid has also increased in the interval from 4 mm on series 4, image 33 on the May 15 study to 8 mm today. The findings are consistent with subdural hygromas, increased in the interval. Recommend clinical correlation. 2. Soft tissue swelling over the left forehead. 3. No fracture or traumatic malalignment in the cervical spine. Degenerative changes as above. Electronically Signed   By: Dorise Bullion III M.D.   On: 05/14/2022 09:16   DG Hip Unilat W or Wo Pelvis 2-3 Views Left  Result Date: 05/14/2022 CLINICAL DATA:  Multiple falls, pain EXAM: DG HIP (WITH OR WITHOUT PELVIS) 2-3V LEFT COMPARISON:  None Available. FINDINGS: No recent fracture or dislocation is seen. Degenerative changes are noted in both hips, more so on the left side. IMPRESSION: No fracture or dislocation is seen in the pelvis and left hip. Degenerative changes are noted in both hips, more so in the left hip. Electronically Signed   By: Elmer Picker M.D.   On: 05/14/2022 09:09        Scheduled Meds:  donepezil  10 mg Oral QHS   insulin aspart  0-9 Units Subcutaneous TID WC   mirtazapine  7.5 mg Oral QHS   pantoprazole  40 mg Oral Daily   sodium chloride flush  3 mL Intravenous Q12H   tamsulosin  0.4 mg Oral Daily   Continuous Infusions:  sodium chloride       LOS: 0 days    Time spent: 35 minutes.     Elmarie Shiley, MD [facility]   If 7PM-7AM, please contact night-coverage [email or web address]  05/15/2022, 7:27 AM

## 2022-05-15 NOTE — Progress Notes (Signed)
CBG check for 0800 was 47, patient did not show signs of hypoglycemia. Gave patient 2 orange juice cups and CBG came up to 63. Shortly after the patient's breakfast tray arrived. Patient ate 100% of breakfast and CBG was rechecked and it was 143.

## 2022-05-15 NOTE — Progress Notes (Signed)
   05/15/22 0918  What Happened  Was fall witnessed? No  Was patient injured? No  Patient found on floor  Found by Staff-comment (RN)  Stated prior activity to/from bed, chair, or stretcher (pt knocked urinal on to the floor and reached for it)  Follow Up  MD notified Yes  Time MD notified 1100  Family notified No - patient refusal  Additional tests No  Simple treatment Other (comment) (none patient did not have any injuries from the fall)  Progress note created (see row info) Yes  Adult Fall Risk Assessment  Risk Factor Category (scoring not indicated) High fall risk per protocol (document High fall risk)  Patient Fall Risk Level High fall risk  Adult Fall Risk Interventions  Required Bundle Interventions *See Row Information* High fall risk - low, moderate, and high requirements implemented  Additional Interventions Reorient/diversional activities with confused patients;Room near nurses station;PT/OT need assessed if change in mobility from baseline;Use of appropriate toileting equipment (bedpan, BSC, etc.)  Screening for Fall Injury Risk (To be completed on HIGH fall risk patients) - Assessing Need for Floor Mats  Risk For Fall Injury- Criteria for Floor Mats Admitted as a result of a fall;Noncompliant with safety precautions  Will Implement Floor Mats Yes  Vitals  Temp 97.7 F (36.5 C)  Temp Source Oral  BP (!) 157/76  MAP (mmHg) 94  BP Location Left Arm  BP Method Automatic  Patient Position (if appropriate) Lying  Pulse Rate 69  Pulse Rate Source Monitor  Resp 20  Oxygen Therapy  SpO2 98 %  O2 Device Room Air  Pain Assessment  Pain Scale 0-10  Pain Score 0  PCA/Epidural/Spinal Assessment  Respiratory Pattern Regular;Unlabored  Neurological  Neuro (WDL) X  Level of Consciousness Alert  Orientation Level Oriented to person;Oriented to place;Oriented to situation;Disoriented to time (Has intermitten confusion)  Cognition Impulsive;Poor  attention/concentration;Poor judgement;Poor safety awareness;Memory impairment  Speech Clear  Neuro Symptoms Forgetful  Glasgow Coma Scale  Eye Opening 4  Best Verbal Response (NON-intubated) 5  Best Motor Response 6  Musculoskeletal  Musculoskeletal (WDL) X  Assistive Device BSC  Generalized Weakness Yes  Weight Bearing Restrictions No  Musculoskeletal Details  RLE Weakness  LLE Weakness  Integumentary  Integumentary (WDL) X  Skin Color Appropriate for ethnicity  Skin Condition Dry  Skin Integrity Abrasion;Other (Comment) (Bruises all over body mostly bilaterale legs and arms)  Abrasion Location Head;Arm  Abrasion Location Orientation Left  Skin Turgor Non-tenting   Patient's bed alarm was going off, upon arrival to responding to bed alarm patient was found sitting on the ground beside the bed. Patient assessed and had no injuries. Patient denied any pain and also denied hitting his head. His orientation level was at his baseline which is alert and oriented w/ intermittent confusion. Vitals signs were checked and stable. Patient returned to the bed and educated again on not getting up without using the calling light. MD made aware.

## 2022-05-15 NOTE — Evaluation (Signed)
Physical Therapy Evaluation Patient Details Name: Brandon Vargas MRN: [id number] DOB: 02-11-1956 Today's Date: 05/15/2022  History of Present Illness  Brandon Vargas is a 66 y.o. male with medical history significant of  T2DM, insomnia, progressive supranuclear palsy with repetitive falls, dementia, lung nodule followed by pulmonology who presented to ED with complaints of fall this AM. He states he fell down about 4 times and he had a hard time getting up  Clinical Impression  Brandon Vargas is a 66 year old man who lives alone in an independent living facility and presents with recurrent falls dues to progressive supranuclear palsy. He is modified independent with a rollator at home, mostly independent with ADLs - he has assistance with baths and medication. On evaluation he demonstrates good overall strength. He was supervision to transfer to edge of bed, min guard to stand and ambulate in hall with RW. Marland Kitchen During evaluation he had no overt loss of balance and able to resist anterior and posterior nudge in standing. He reports falling almost daily and presents with multiple bruises.  Pt also fell getting out of hospital bed this morning. Will follow acutely to continue to assess patient and try to determine mechanism of falls in order to rehabilitate or learn compensatory strategies to reduce falls. Due to frequency of falls and progressive disease patient may need to learn to be modified independent at wheelchair level prior to returning home. Recommend discharge to SNF and would recommend family and patient pursue high level of care for near future.        Recommendations for follow up therapy are one component of a multi-disciplinary discharge planning process, led by the attending physician.  Recommendations may be updated based on patient status, additional functional criteria and insurance authorization.  Follow Up Recommendations Skilled nursing-short term rehab (<3 hours/day)    Assistance  Recommended at Discharge Frequent or constant Supervision/Assistance  Patient can return home with the following  A little help with walking and/or transfers;A little help with bathing/dressing/bathroom;Assistance with cooking/housework;Assist for transportation;Help with stairs or ramp for entrance    Equipment Recommendations None recommended by PT  Recommendations for Other Services       Functional Status Assessment Patient has had a recent decline in their functional status and demonstrates the ability to make significant improvements in function in a reasonable and predictable amount of time. (increasing number of falls)     Precautions / Restrictions Precautions Precautions: Fall Precaution Comments: Falls almost daily Restrictions Weight Bearing Restrictions: No      Mobility  Bed Mobility Overal bed mobility: Needs Assistance Bed Mobility: Supine to Sit     Supine to sit: Supervision          Transfers Overall transfer level: Needs assistance Equipment used: Rolling walker (2 wheels) Transfers: Sit to/from Stand Sit to Stand: Min guard           General transfer comment: min guard for safety    Ambulation/Gait Ambulation/Gait assistance: Min assist, Min guard Gait Distance (Feet): 400 Feet Assistive device: Rolling walker (2 wheels) Gait Pattern/deviations: Step-through pattern, Decreased step length - right, Decreased step length - left, Shuffle, Trunk flexed       General Gait Details: cues for posture and position from RW; occasional min assist to manage RW  Stairs            Wheelchair Mobility    Modified Rankin (Stroke Patients Only)       Balance Overall balance assessment: Needs assistance  Sitting-balance support: No upper extremity supported, Feet supported Sitting balance-Leahy Scale: Fair     Standing balance support: During functional activity Standing balance-Leahy Scale: Fair Standing balance comment: Able to take  hands off of walker, able to resist anterior and posterior nudge, able to high step holding on to walker,                             Pertinent Vitals/Pain Pain Assessment Pain Assessment: No/denies pain    Home Living Family/patient expects to be discharged to:: Other (Comment)                   Additional Comments: Independent Living    Prior Function Prior Level of Function : Independent/Modified Independent             Mobility Comments: ambulates with rolator but multiple falls - states he falls when the rollator "catches on things....like the elevator gap". ADLs Comments: independent with ADLs, brother has someone come in and give him his medication and baths 3 x a week     Hand Dominance   Dominant Hand: Right    Extremity/Trunk Assessment   Upper Extremity Assessment Upper Extremity Assessment: Defer to OT evaluation    Lower Extremity Assessment Lower Extremity Assessment: Overall WFL for tasks assessed    Cervical / Trunk Assessment Cervical / Trunk Assessment: Neck Surgery;Other exceptions Cervical / Trunk Exceptions: cervical kyphosis  Communication   Communication: Expressive difficulties  Cognition Arousal/Alertness: Awake/alert Behavior During Therapy: Flat affect Overall Cognitive Status: Within Functional Limits for tasks assessed                                 General Comments: Mostly functional. Able to follow commands and answer PLOF questions appropriately. Has had speech in the past.        General Comments      Exercises     Assessment/Plan    PT Assessment Patient needs continued PT services  PT Problem List Decreased balance;Decreased mobility;Decreased safety awareness;Decreased knowledge of use of DME       PT Treatment Interventions DME instruction;Gait training;Functional mobility training;Therapeutic exercise;Therapeutic activities;Balance training;Wheelchair mobility training    PT  Goals (Current goals can be found in the Care Plan section)  Acute Rehab PT Goals Patient Stated Goal: Regain IND PT Goal Formulation: With patient Time For Goal Achievement: 05/29/22 Potential to Achieve Goals: Fair    Frequency Min 3X/week     Co-evaluation               AM-PAC PT "6 Clicks" Mobility  Outcome Measure Help needed turning from your back to your side while in a flat bed without using bedrails?: None Help needed moving from lying on your back to sitting on the side of a flat bed without using bedrails?: A Little Help needed moving to and from a bed to a chair (including a wheelchair)?: A Little Help needed standing up from a chair using your arms (e.g., wheelchair or bedside chair)?: A Little Help needed to walk in hospital room?: A Little Help needed climbing 3-5 steps with a railing? : A Lot 6 Click Score: 18    End of Session Equipment Utilized During Treatment: Gait belt Activity Tolerance: Patient tolerated treatment well Patient left: in chair;with call bell/phone within reach;with chair alarm set Nurse Communication: Mobility status PT Visit Diagnosis: Unsteadiness on feet (  R26.81);Repeated falls (R29.6)    Time: 2694-8546 PT Time Calculation (min) (ACUTE ONLY): 32 min   Charges:   PT Evaluation $PT Eval Low Complexity: [place] [facility] Pager [phone] Office [phone]   Alys Dulak 05/15/2022, 4:54 PM

## 2022-05-16 DIAGNOSIS — R262 Difficulty in walking, not elsewhere classified: Secondary | ICD-10-CM | POA: Diagnosis not present

## 2022-05-16 LAB — CBC
HCT: 36.2 % — ABNORMAL LOW (ref 39.0–52.0)
Hemoglobin: 12.1 g/dL — ABNORMAL LOW (ref 13.0–17.0)
MCH: 28.5 pg (ref 26.0–34.0)
MCHC: 33.4 g/dL (ref 30.0–36.0)
MCV: 85.2 fL (ref 80.0–100.0)
Platelets: 281 10*3/uL (ref 150–400)
RBC: 4.25 MIL/uL (ref 4.22–5.81)
RDW: 13.9 % (ref 11.5–15.5)
WBC: 7.9 10*3/uL (ref 4.0–10.5)
nRBC: 0 % (ref 0.0–0.2)

## 2022-05-16 LAB — BASIC METABOLIC PANEL
Anion gap: 7 (ref 5–15)
BUN: 16 mg/dL (ref 8–23)
CO2: 24 mmol/L (ref 22–32)
Calcium: 8.6 mg/dL — ABNORMAL LOW (ref 8.9–10.3)
Chloride: 108 mmol/L (ref 98–111)
Creatinine, Ser: 0.97 mg/dL (ref 0.61–1.24)
GFR, Estimated: >60 mL/min (ref >60)
Glucose, Bld: 162 mg/dL — ABNORMAL HIGH (ref 70–99)
Potassium: 4.2 mmol/L (ref 3.5–5.1)
Sodium: 139 mmol/L (ref 135–145)

## 2022-05-16 LAB — GLUCOSE, CAPILLARY
Glucose-Capillary: 93 mg/dL (ref 70–99)
Glucose-Capillary: 85 mg/dL (ref 70–99)
Glucose-Capillary: 136 mg/dL — ABNORMAL HIGH (ref 70–99)
Glucose-Capillary: 139 mg/dL — ABNORMAL HIGH (ref 70–99)
Glucose-Capillary: 164 mg/dL — ABNORMAL HIGH (ref 70–99)

## 2022-05-16 NOTE — Progress Notes (Signed)
PROGRESS NOTE    Brandon Vargas  YTK:[id number] DOB: [birth year] DOA: 05/14/2022 PCP: de [place], Raymond J, MD   Brief Narrative: 66 year old with past medical history significant for diabetes type 2, insomnia, progressive supranuclear palsy with recurrent fall, dementia, lung nodule followed by Dr. Katy Fitch presents to the ED complaining of fall the morning of admission.  Patient reported that he has fall, about 4 times and he had a hard time getting up.  CT showed progressive subdural hygroma, neurosurgery was called and stated there was nothing to do regarding this.  Patient was going to be discharged from the ED, however when the staff got him to ambulate he required max assist and became very unsteady with just a few steps.  Patient was admitted for placement.   Assessment & Plan:   Principal Problem:   Ambulatory dysfunction with frequent falls likely secondary to progressing PSP Active Problems:   Dementia without behavioral disturbance (HCC)   Normocytic anemia   Diabetes (HCC)   Pulmonary nodule   Benign prostatic hyperplasia without lower urinary tract symptoms   Insomnia   Falls   Progressive supranuclear palsy (HCC)   Subdural hygroma   Localized swelling of both lower legs   GERD (gastroesophageal reflux disease)   1-Ambulatory dysfunction with frequent fall, likely secondary to progressive PSP -Patient with progressive decline in overall function is/ambulation and unable to ambulate without max assist.  He is not safe to return home alone. -Likely related to progression of PSP -PT OT consulted, recommend rehab.  -Orthostatic vitals: PT will check orthostatic  -Gabapentin on hold,  thought this  might be contributing -TSH: 1.1 -Fall precaution -Needs placement -he will benefit from palliative care consultation at Rehab.   2-Dementia without behavioral disturbance: -Delirium precaution -Needs higher  level of care  3-Normocytic anemia: of chronic diseases.  Anemia  panel: Ferritin 147, iron 52, Sat ratio: 16, B 12 336.    Diabetes type 2: Hypoglycemia.  -Recent A1c 6.6 -Hold glipizide, will need different medication with less risk for hypoglycemia.  -Agree with considering a different agent to prevent hypoglycemia. -Sliding scale insulin while in the hospital   Pulmonary nodule: -Follow-up with Dr. Katy Fitch. -Plan to repeat imaging in 3 months, as scheduled for May 26.  GERD: Continue with Protonix.  Bilateral lower extremity edema: TED hose compression.  Subdural hygroma: -Subdural hygroma increase in interval.  However neurosurgery did not feel these were of clinically significant and does not require further work-up.  Insomnia: -Continue with Remeron.   Benign prostatic hyperplasia without lower urinary tract symptoms -Continue with Flomax.     Estimated body mass index is 26.39 kg/m as calculated from the following:   Height as of this encounter: 6\' 1"  (1.854 m).   Weight as of this encounter: 90.7 kg.   DVT prophylaxis: SCD Code Status: Full code Family Communication: Care discussed with brother over phone  Disposition Plan:  Status is: Observation The patient remains OBS appropriate and will d/c before 2 midnights.    Consultants:  Neurosurgery consulted by ED, phone consultation.   Procedures:  None  Antimicrobials:    Subjective: He is alert, eating lunch. Denies pain    Objective: Vitals:   05/15/22 0918 05/15/22 1221 05/15/22 2119 05/16/22 0539  BP: (!) 157/76 112/62 114/67 123/78  Pulse: 69 60 (!) 59 61  Resp: 20 16 20 20   Temp: 97.7 F (36.5 C) 98.7 F (37.1 C) 98.1 F (36.7 C) 97.6 F (36.4 C)  TempSrc: Oral Oral  Oral Oral  SpO2: 98% 95% 100% 100%  Weight:      Height:        Intake/Output Summary (Last 24 hours) at 05/16/2022 1211 Last data filed at 05/16/2022 1100 Gross per 24 hour  Intake 720 ml  Output 2550 ml  Net -1830 ml    Filed Weights   05/14/22 0625  Weight: 90.7 kg     Examination:  General exam: NAD Respiratory system: CTA Cardiovascular system: S 1, S 2 RRR Gastrointestinal system: BS present, soft nt Central nervous system: alert, follows command Extremities: No edema   Data Reviewed: I have personally reviewed following labs and imaging studies  CBC: Recent Labs  Lab 05/14/22 0740 05/15/22 0327  WBC 10.7* 7.6  NEUTROABS 8.9*  --   HGB 11.9* 11.4*  HCT 36.8* 35.3*  MCV 83.6 85.1  PLT 297 916    Basic Metabolic Panel: Recent Labs  Lab 05/14/22 0740 05/15/22 0327  NA 141 143  K 4.1 4.3  CL 105 111  CO2 27 25  GLUCOSE 105* 109*  BUN 18 13  CREATININE 0.88 0.84  CALCIUM 8.7* 8.8*    GFR: Estimated Creatinine Clearance: 95.1 mL/min (by C-G formula based on SCr of 0.84 mg/dL). Liver Function Tests: Recent Labs  Lab 05/14/22 0740  AST 21  ALT 22  ALKPHOS 69  BILITOT 0.9  PROT 6.5  ALBUMIN 3.7    No results for input(s): LIPASE, AMYLASE in the last 168 hours. No results for input(s): AMMONIA in the last 168 hours. Coagulation Profile: No results for input(s): INR, PROTIME in the last 168 hours. Cardiac Enzymes: No results for input(s): CKTOTAL, CKMB, CKMBINDEX, TROPONINI in the last 168 hours. BNP (last 3 results) No results for input(s): PROBNP in the last 8760 hours. HbA1C: No results for input(s): HGBA1C in the last 72 hours. CBG: Recent Labs  Lab 05/15/22 1158 05/15/22 1648 05/15/22 2121 05/16/22 0830 05/16/22 1201  GLUCAP 89 103* 144* 93 85    Lipid Profile: No results for input(s): CHOL, HDL, LDLCALC, TRIG, CHOLHDL, LDLDIRECT in the last 72 hours. Thyroid Function Tests: Recent Labs    05/14/22 1807  TSH 1.131    Anemia Panel: Recent Labs    05/14/22 1807  VITAMINB12 336  FERRITIN 147  TIBC 317  IRON 52    Sepsis Labs: No results for input(s): PROCALCITON, LATICACIDVEN in the last 168 hours.  No results found for this or any previous visit (from the past 240 hour(s)).        Radiology Studies: No results found.      Scheduled Meds:  donepezil  10 mg Oral QHS   insulin aspart  0-9 Units Subcutaneous TID WC   mirtazapine  7.5 mg Oral QHS   pantoprazole  40 mg Oral Daily   sodium chloride flush  3 mL Intravenous Q12H   tamsulosin  0.4 mg Oral Daily   Continuous Infusions:  sodium chloride       LOS: 0 days    Time spent: 35 minutes.     Elmarie Shiley, MD [facility]   If 7PM-7AM, please contact night-coverage [email or web address]  05/16/2022, 12:11 PM

## 2022-05-17 DIAGNOSIS — R262 Difficulty in walking, not elsewhere classified: Secondary | ICD-10-CM | POA: Diagnosis not present

## 2022-05-17 LAB — QUANTIFERON-TB GOLD PLUS
QuantiFERON Mitogen Value: >10 IU/mL
QuantiFERON Nil Value: 2.25 IU/mL
QuantiFERON TB1 Ag Value: 0.13 IU/mL
QuantiFERON TB2 Ag Value: 0.09 IU/mL
QuantiFERON-TB Gold Plus: NEGATIVE

## 2022-05-17 LAB — GLUCOSE, CAPILLARY
Glucose-Capillary: 110 mg/dL — ABNORMAL HIGH (ref 70–99)
Glucose-Capillary: 125 mg/dL — ABNORMAL HIGH (ref 70–99)
Glucose-Capillary: 167 mg/dL — ABNORMAL HIGH (ref 70–99)
Glucose-Capillary: 142 mg/dL — ABNORMAL HIGH (ref 70–99)

## 2022-05-17 NOTE — Progress Notes (Addendum)
PROGRESS NOTE    Brandon Vargas  OZD:[id number] DOB: [birth year] DOA: 05/14/2022 PCP: [place], Raymond J, MD   Brief Narrative: 66 year old with past medical history significant for diabetes type 2, insomnia, progressive supranuclear palsy with recurrent fall, dementia, lung nodule followed by Dr. Katy Fitch presents to the ED complaining of fall the morning of admission.  Patient reported that he has fall, about 4 times and he had a hard time getting up.  CT showed progressive subdural hygroma, neurosurgery was called and stated there was nothing to do regarding this.  Patient was going to be discharged from the ED, however when the staff got him to ambulate he required max assist and became very unsteady with just a few steps.  Patient was admitted for placement.  Stable, awaiting placement.   Assessment & Plan:   Principal Problem:   Ambulatory dysfunction with frequent falls likely secondary to progressing PSP Active Problems:   Dementia without behavioral disturbance (HCC)   Normocytic anemia   Diabetes (HCC)   Pulmonary nodule   Benign prostatic hyperplasia without lower urinary tract symptoms   Insomnia   Falls   Progressive supranuclear palsy (HCC)   Subdural hygroma   Localized swelling of both lower legs   GERD (gastroesophageal reflux disease)   1-Ambulatory dysfunction with frequent fall, likely secondary to progressive PSP -Patient with progressive decline in overall function is/ambulation and unable to ambulate without max assist.  He is not safe to return home alone. -Likely related to progression of PSP -PT OT consulted, recommend rehab.  -Orthostatic vitals: PT will check orthostatic  -Gabapentin on hold,  thought this  might be contributing -TSH: 1.1 -Fall precaution -Needs placement -He will benefit from palliative care consultation at Rehab.   2-Dementia without behavioral disturbance: -Delirium precaution   3-Normocytic anemia: of chronic diseases.  Anemia  panel: Ferritin 147, iron 52, Sat ratio: 16, B 12 336.  Monitor Hb.   Diabetes type 2: Hypoglycemia.  -Recent A1c 6.6 -Hold glipizide, will need different medication with less risk for hypoglycemia.  -Agree with considering a different agent to prevent hypoglycemia. -Sliding scale insulin while in the hospital   Pulmonary nodule: -Follow-up with Dr. Katy Fitch. -Plan to repeat imaging in 3 months, as scheduled for May 26.  GERD: Continue with Protonix.  Bilateral lower extremity edema: TED hose compression.  Subdural hygroma: -Subdural hygroma increase in interval.  However neurosurgery did not feel these were of clinically significant and does not require further work-up.  Insomnia: -Continue with Remeron.   Benign prostatic hyperplasia without lower urinary tract symptoms -Continue with Flomax.     Estimated body mass index is 26.39 kg/m as calculated from the following:   Height as of this encounter: 6\' 1"  (1.854 m).   Weight as of this encounter: 90.7 kg.   DVT prophylaxis: SCD Code Status: Full code Family Communication: Care discussed with brother over phone  5/22. Disposition Plan: Awaiting placement.  Status is: Observation The patient remains OBS appropriate and will d/c before 2 midnights.    Consultants:  Neurosurgery consulted by ED, phone consultation.   Procedures:  None  Antimicrobials:    Subjective: \Patient denies abdominal pain or chest pain. He has been able to tolerate diet.    Objective: Vitals:   05/16/22 1311 05/16/22 2111 05/17/22 0528 05/17/22 1323  BP: (!) 119/58 (!) 117/48 136/73 140/73  Pulse: 65 63 (!) 57 68  Resp: 18 18 20 16   Temp: 98.1 F (36.7 C) 98 F (36.7 C)  98 F (36.7 C) 98 F (36.7 C)  TempSrc: Oral Oral  Oral  SpO2: 100% 94% 100% 100%  Weight:      Height:        Intake/Output Summary (Last 24 hours) at 05/17/2022 1345 Last data filed at 05/17/2022 1326 Gross per 24 hour  Intake 1196 ml  Output 1450 ml   Net -254 ml    Filed Weights   05/14/22 0625  Weight: 90.7 kg    Examination:  General exam: NAD, Follows command Respiratory system: CTA Cardiovascular system: S 1, S 2 RRR Gastrointestinal system: BS present, soft, nt Central nervous system: Alert, follows command Extremities: No edema   Data Reviewed: I have personally reviewed following labs and imaging studies  CBC: Recent Labs  Lab 05/14/22 0740 05/15/22 0327 05/16/22 1237  WBC 10.7* 7.6 7.9  NEUTROABS 8.9*  --   --   HGB 11.9* 11.4* 12.1*  HCT 36.8* 35.3* 36.2*  MCV 83.6 85.1 85.2  PLT 297 273 451    Basic Metabolic Panel: Recent Labs  Lab 05/14/22 0740 05/15/22 0327 05/16/22 1237  NA 141 143 139  K 4.1 4.3 4.2  CL 105 111 108  CO2 27 25 24   GLUCOSE 105* 109* 162*  BUN 18 13 16   CREATININE 0.88 0.84 0.97  CALCIUM 8.7* 8.8* 8.6*    GFR: Estimated Creatinine Clearance: 82.4 mL/min (by C-G formula based on SCr of 0.97 mg/dL). Liver Function Tests: Recent Labs  Lab 05/14/22 0740  AST 21  ALT 22  ALKPHOS 69  BILITOT 0.9  PROT 6.5  ALBUMIN 3.7    No results for input(s): LIPASE, AMYLASE in the last 168 hours. No results for input(s): AMMONIA in the last 168 hours. Coagulation Profile: No results for input(s): INR, PROTIME in the last 168 hours. Cardiac Enzymes: No results for input(s): CKTOTAL, CKMB, CKMBINDEX, TROPONINI in the last 168 hours. BNP (last 3 results) No results for input(s): PROBNP in the last 8760 hours. HbA1C: No results for input(s): HGBA1C in the last 72 hours. CBG: Recent Labs  Lab 05/16/22 1541 05/16/22 1734 05/16/22 2113 05/17/22 0834 05/17/22 1156  GLUCAP 136* 139* 164* 110* 125*    Lipid Profile: No results for input(s): CHOL, HDL, LDLCALC, TRIG, CHOLHDL, LDLDIRECT in the last 72 hours. Thyroid Function Tests: Recent Labs    05/14/22 1807  TSH 1.131    Anemia Panel: Recent Labs    05/14/22 1807  VITAMINB12 336  FERRITIN 147  TIBC 317  IRON  52    Sepsis Labs: No results for input(s): PROCALCITON, LATICACIDVEN in the last 168 hours.  No results found for this or any previous visit (from the past 240 hour(s)).       Radiology Studies: No results found.      Scheduled Meds:  donepezil  10 mg Oral QHS   insulin aspart  0-9 Units Subcutaneous TID WC   mirtazapine  7.5 mg Oral QHS   pantoprazole  40 mg Oral Daily   sodium chloride flush  3 mL Intravenous Q12H   tamsulosin  0.4 mg Oral Daily   Continuous Infusions:  sodium chloride       LOS: 0 days    Time spent: 35 minutes.     Elmarie Shiley, MD [facility]   If 7PM-7AM, please contact night-coverage [email or web address]  05/17/2022, 1:45 PM

## 2022-05-17 NOTE — Progress Notes (Signed)
Occupational Therapy Treatment Patient Details Name: Brandon Vargas MRN: [id number] DOB: 1956-03-26 Today's Date: 05/17/2022   History of present illness Brandon Vargas is a 66 y.o. male with medical history significant of  T2DM, insomnia, progressive supranuclear palsy with repetitive falls, dementia, lung nodule followed by pulmonology who presented to ED with complaints of fall this AM. He states he fell down about 4 times and he had a hard time getting up   OT comments  Patient was noted to have strong initial posterior leaning upon standing but able to progress to reduced assistance to complete transfers. Patient needed cues and physical assist for each sit to stand to adjust for posterior leaning. Patient was min guard to wash up BLE sitting EOB with increased time and cues for safety. Patient's discharge plan remains appropriate at this time. OT will continue to follow acutely.      Recommendations for follow up therapy are one component of a multi-disciplinary discharge planning process, led by the attending physician.  Recommendations may be updated based on patient status, additional functional criteria and insurance authorization.    Follow Up Recommendations  Skilled nursing-short term rehab (<3 hours/day)    Assistance Recommended at Discharge Frequent or constant Supervision/Assistance  Patient can return home with the following  Direct supervision/assist for medications management;Direct supervision/assist for financial management;Assistance with cooking/housework;A little help with bathing/dressing/bathroom;A little help with walking and/or transfers   Equipment Recommendations  None recommended by OT    Recommendations for Other Services      Precautions / Restrictions Precautions Precautions: Fall Precaution Comments: Falls almost daily Restrictions Weight Bearing Restrictions: No       Mobility Bed Mobility Overal bed mobility: Needs Assistance Bed Mobility:  Supine to Sit     Supine to sit: Supervision          Transfers                         Balance Overall balance assessment: Needs assistance Sitting-balance support: No upper extremity supported, Feet supported Sitting balance-Leahy Scale: Fair     Standing balance support: During functional activity, Single extremity supported Standing balance-Leahy Scale: Poor                             ADL either performed or assessed with clinical judgement   ADL Overall ADL's : Needs assistance/impaired     Grooming: Standing;Minimal assistance Grooming Details (indicate cue type and reason): with noted posterior leaning upon inital standing with patient able to adjust with increased time and physical assistance Upper Body Bathing: Set up;Sitting   Lower Body Bathing: Set up;Min guard;Sitting/lateral leans   Upper Body Dressing : Set up;Sitting   Lower Body Dressing: Min guard;Sit to/from stand   Toilet Transfer: Minimal assistance Toilet Transfer Details (indicate cue type and reason): patient was min A to transfer from edge of bed to recliner in room wiht min A with intiatl strong posterior leaning then able to transition to min guard. Toileting- Water quality scientist and Hygiene: Min guard;Sit to/from stand              Extremity/Trunk Assessment              Vision       Perception     Praxis      Cognition Arousal/Alertness: Awake/alert Behavior During Therapy: Flat affect Overall Cognitive Status: Within Functional Limits for tasks assessed  General Comments: noted to repeat cues but able to follow them appropriately, poor safety awareness.        Exercises      Shoulder Instructions       General Comments      Pertinent Vitals/ Pain       Pain Assessment Pain Assessment: No/denies pain  Home Living                                          Prior  Functioning/Environment              Frequency  Min 2X/week        Progress Toward Goals  OT Goals(current goals can now be found in the care plan section)  Progress towards OT goals: Progressing toward goals     Plan Discharge plan remains appropriate    Co-evaluation                 AM-PAC OT "6 Clicks" Daily Activity     Outcome Measure   Help from another person eating meals?: None Help from another person taking care of personal grooming?: A Little Help from another person toileting, which includes using toliet, bedpan, or urinal?: A Little Help from another person bathing (including washing, rinsing, drying)?: A Little Help from another person to put on and taking off regular upper body clothing?: A Little Help from another person to put on and taking off regular lower body clothing?: A Little 6 Click Score: 19    End of Session Equipment Utilized During Treatment: Rolling walker (2 wheels);Gait belt  OT Visit Diagnosis: Repeated falls (R29.6)   Activity Tolerance Patient tolerated treatment well   Patient Left in chair;with call bell/phone within reach;with chair alarm set   Nurse Communication Mobility status        Time: 9211-9417 OT Time Calculation (min): 24 min  Charges: OT General Charges $OT Visit: 1 Visit OT Treatments $Self Care/Home Management : 23-37 mins  Jackelyn Poling OTR/L, MS Acute Rehabilitation Department Office# [phone] Pager# [phone]   Marcellina Millin 05/17/2022, 10:06 AM

## 2022-05-17 NOTE — NC FL2 (Signed)
[place] LEVEL OF CARE SCREENING TOOL     IDENTIFICATION  Patient Name: Brandon Vargas Christen Birthdate: February 10, 1956 Sex: male Admission Date (Current Location): 05/14/2022  [facility] and [place] Number:  Herbalist and Address:  [facility],  [address] [place], [place]      Provider Number: [id number]  Attending Physician Name and Address:  Elmarie Shiley, MD  Relative Name and Phone Number:       Current Level of Care: Hospital Recommended Level of Care: [place] Prior Approval Number:    Date Approved/Denied:   PASRR Number: pending  Discharge Plan: SNF    Current Diagnoses: Patient Active Problem List   Diagnosis Date Noted   Ambulatory dysfunction with frequent falls likely secondary to progressing PSP 05/14/2022   Progressive supranuclear palsy ([place]) 05/14/2022   Normocytic anemia 05/14/2022   Dementia without behavioral disturbance ([place]) 05/14/2022   Subdural hygroma 05/14/2022   Localized swelling of both lower legs 05/14/2022   GERD (gastroesophageal reflux disease) 05/14/2022   Irregular sleep-wake rhythm 03/24/2022   Falls 02/22/2022   Lumbar transverse process fracture ([place]) 02/22/2022   Pulmonary nodule 02/22/2022   Diabetes ([place]) 09/29/2021   Vitamin B12 deficiency 09/29/2021   Neurocognitive disorder 09/29/2021   Insomnia 09/29/2021   Abnormality of gait 11/02/2018   Balance problem 11/02/2018   Elevated PSA 04/09/2018   Dermatophytosis of groin 12/24/2017   Hyperglycemia 05/29/2017   IFG (impaired fasting glucose) 05/29/2017   Anxiety 04/17/2017   Benign prostatic hyperplasia without lower urinary tract symptoms 04/17/2017    Orientation RESPIRATION BLADDER Height & Weight     Self, Time, Situation, Place  Normal Continent Weight: 90.7 kg Height:  6\' 1"  (185.4 cm)  BEHAVIORAL SYMPTOMS/[place] NEUROLOGICAL BOWEL NUTRITION STATUS      Continent Diet (re3gular)  AMBULATORY STATUS  COMMUNICATION OF NEEDS Skin   Extensive Assist Verbally Normal                       Personal Care Assistance Level of Assistance  Bathing, Feeding, Dressing Bathing Assistance: Limited assistance Feeding assistance: Limited assistance Dressing Assistance: Limited assistance     Functional Limitations Info  Sight, Hearing, Speech Sight Info: Adequate Hearing Info: Adequate Speech Info: Adequate    SPECIAL CARE FACTORS FREQUENCY  PT (By licensed PT), OT (By licensed OT)     PT Frequency: 5 x weeklky OT Frequency: 5 x weekly            Contractures Contractures Info: Present    Additional Factors Info  Code Status Code Status Info: full             Current Medications (05/17/2022):  This is the current hospital active medication list Current Facility-Administered Medications  Medication Dose Route Frequency Provider Last Rate Last Admin   0.9 %  sodium chloride infusion  250 mL Intravenous PRN Orma Flaming, MD       acetaminophen (TYLENOL) tablet 650 mg  650 mg Oral Q6H PRN Orma Flaming, MD       Or   acetaminophen (TYLENOL) suppository 650 mg  650 mg Rectal Q6H PRN Orma Flaming, MD       cyclobenzaprine (FLEXERIL) tablet 5 mg  5 mg Oral QHS PRN Orma Flaming, MD   5 mg at 05/14/22 2023   donepezil (ARICEPT) tablet 10 mg  10 mg Oral QHS Orma Flaming, MD   10 mg at 05/16/22 2157   insulin aspart (  novoLOG) injection 0-9 Units  0-9 Units Subcutaneous TID WC Orma Flaming, MD   1 Units at 05/16/22 1846   mirtazapine (REMERON) tablet 7.5 mg  7.5 mg Oral QHS Orma Flaming, MD   7.5 mg at 05/16/22 2157   pantoprazole (PROTONIX) EC tablet 40 mg  40 mg Oral Daily Orma Flaming, MD   40 mg at 05/17/22 0945   sodium chloride flush (NS) 0.9 % injection 3 mL  3 mL Intravenous Q12H Orma Flaming, MD   3 mL at 05/17/22 0947   sodium chloride flush (NS) 0.9 % injection 3 mL  3 mL Intravenous PRN Orma Flaming, MD   3 mL at 05/14/22 2028   tamsulosin (FLOMAX)  capsule 0.4 mg  0.4 mg Oral Daily Orma Flaming, MD   0.4 mg at 05/17/22 1191     Discharge Medications: Please see discharge summary for a list of discharge medications.  Relevant Imaging Results:  Relevant Lab Results:   Additional Information ssn=[id number]  Leeroy Cha, RN

## 2022-05-17 NOTE — NC FL2 (Signed)
[place] LEVEL OF CARE SCREENING TOOL     IDENTIFICATION  Patient Name: Brandon Vargas Birthdate: 1956-07-03 Sex: male Admission Date (Current Location): 05/14/2022  [facility] and [place] Number:  Herbalist and Address:  [facility],  [address] [place], [place]      Provider Number: [id number]  Attending Physician Name and Address:  Elmarie Shiley, MD  Relative Name and Phone Number:       Current Level of Care: Hospital Recommended Level of Care: [place] Prior Approval Number:    Date Approved/Denied:   PASRR Number: [id number]  Discharge Plan: SNF    Current Diagnoses: Patient Active Problem List   Diagnosis Date Noted   Ambulatory dysfunction with frequent falls likely secondary to progressing PSP 05/14/2022   Progressive supranuclear palsy ([place]) 05/14/2022   Normocytic anemia 05/14/2022   Dementia without behavioral disturbance ([place]) 05/14/2022   Subdural hygroma 05/14/2022   Localized swelling of both lower legs 05/14/2022   GERD (gastroesophageal reflux disease) 05/14/2022   Irregular sleep-wake rhythm 03/24/2022   Falls 02/22/2022   Lumbar transverse process fracture ([place]) 02/22/2022   Pulmonary nodule 02/22/2022   Diabetes ([place]) 09/29/2021   Vitamin B12 deficiency 09/29/2021   Neurocognitive disorder 09/29/2021   Insomnia 09/29/2021   Abnormality of gait 11/02/2018   Balance problem 11/02/2018   Elevated PSA 04/09/2018   Dermatophytosis of groin 12/24/2017   Hyperglycemia 05/29/2017   IFG (impaired fasting glucose) 05/29/2017   Anxiety 04/17/2017   Benign prostatic hyperplasia without lower urinary tract symptoms 04/17/2017    Orientation RESPIRATION BLADDER Height & Weight     Self, Time, Situation, Place  Normal Continent Weight: 90.7 kg Height:  6\' 1"  (185.4 cm)  BEHAVIORAL SYMPTOMS/MOOD NEUROLOGICAL BOWEL NUTRITION STATUS      Continent Diet (re3gular)  AMBULATORY STATUS  COMMUNICATION OF NEEDS Skin   Extensive Assist Verbally Normal                       Personal Care Assistance Level of Assistance  Bathing, Feeding, Dressing Bathing Assistance: Limited assistance Feeding assistance: Limited assistance Dressing Assistance: Limited assistance     Functional Limitations Info  Sight, Hearing, Speech Sight Info: Adequate Hearing Info: Adequate Speech Info: Adequate    SPECIAL CARE FACTORS FREQUENCY  PT (By licensed PT), OT (By licensed OT)     PT Frequency: 5 x weeklky OT Frequency: 5 x weekly            Contractures Contractures Info: Present    Additional Factors Info  Code Status Code Status Info: full             Current Medications (05/17/2022):  This is the current hospital active medication list Current Facility-Administered Medications  Medication Dose Route Frequency Provider Last Rate Last Admin   0.9 %  sodium chloride infusion  250 mL Intravenous PRN Orma Flaming, MD       acetaminophen (TYLENOL) tablet 650 mg  650 mg Oral Q6H PRN Orma Flaming, MD       Or   acetaminophen (TYLENOL) suppository 650 mg  650 mg Rectal Q6H PRN Orma Flaming, MD       cyclobenzaprine (FLEXERIL) tablet 5 mg  5 mg Oral QHS PRN Orma Flaming, MD   5 mg at 05/14/22 2023   donepezil (ARICEPT) tablet 10 mg  10 mg Oral QHS Orma Flaming, MD   10 mg at 05/16/22 2157   insulin aspart (  novoLOG) injection 0-9 Units  0-9 Units Subcutaneous TID WC Orma Flaming, MD   1 Units at 05/17/22 1207   mirtazapine (REMERON) tablet 7.5 mg  7.5 mg Oral QHS Orma Flaming, MD   7.5 mg at 05/16/22 2157   pantoprazole (PROTONIX) EC tablet 40 mg  40 mg Oral Daily Orma Flaming, MD   40 mg at 05/17/22 0945   sodium chloride flush (NS) 0.9 % injection 3 mL  3 mL Intravenous Q12H Orma Flaming, MD   3 mL at 05/17/22 0947   sodium chloride flush (NS) 0.9 % injection 3 mL  3 mL Intravenous PRN Orma Flaming, MD   3 mL at 05/14/22 2028   tamsulosin (FLOMAX)  capsule 0.4 mg  0.4 mg Oral Daily Orma Flaming, MD   0.4 mg at 05/17/22 2767     Discharge Medications: Please see discharge summary for a list of discharge medications.  Relevant Imaging Results:  Relevant Lab Results:   Additional Information ssn=[id number]  Leeroy Cha, RN

## 2022-05-17 NOTE — Progress Notes (Addendum)
Physical Therapy Treatment Patient Details Name: Brandon Vargas MRN: [id number] DOB: [birth year] Today's Date: 05/17/2022   History of Present Illness 66 year old with past medical history significant for diabetes type 2, insomnia, progressive supranuclear palsy with recurrent fall, dementia, lung nodule followed by Dr. Katy Fitch presents to the ED complaining of fall the morning of admission.  Patient reported that he has fall, about 4 times and he had a hard time getting up.     CT showed progressive subdural hygroma, neurosurgery was called    PT Comments    General Comments: Pleasant AxO x 2.5 following all commands and able to give history.  Poor safety cognition.  Pt OOB in recliner.   Assisted out of recliner was difficult.  General transfer comment: sit to stand can range any where from [employer] to Max Assist depending on height of bed/chair/toilet.  Again, pt lacks any "controlled" motor skills to slow/safely perform sit to stand.  From recliner, but was unable to self perform even using B UE's on arm rest.  Pt  using "rocking momentum" back and forth in attempt to stand but x 4 fell backward onto recliner.  Therapist had to offer Mod Assist to achieve partial upright stance. Static standing posture is poor with head forward flex nearly 90 degrees.  Static standing balance is poor as pt begins to sway and has difficulty achieving center.  General Gait Details: Pt's gait is unsteady with short, shuffled steps and very low floor clearance.  Also present with freezing/festination. His posture is poor with "head downward" nearly 90 degree cervical flexion.  He has good B LE stregth but lack any balance correction reaction responce.  VERY unsteady esp with turns.  HIGH FALL RISK.  BERG balance test performed (see [place]).  Also, performed/tested his backward gait and lateral steps in which pt had a Total loss of balance, Therapist corrected with holding onto gait belt.  With stand to sit, again pt lack  "controlled" motor skills to sit safely.  Pt tends to "fall" into recliner.  HIGH FALL RISK.  Trial/test in/OOB was difficult.  General bed mobility comments: pt is physically strong but he lacks "controlled" motor skills and tends to uses momentum to sit up to EOB.   Then performed a BERG Balance Test in which pt only scored an 8/56 indicating 100% FALL RISK. Then assisted to EOB which he lacks "controlled" motor skills to maintain a safe unsupported static sitting balance.  He immediately falls backward and is unable to center self or correct self.  Assisted back to recliner and positioned to comfort.   Pt will need ST Rehab at SNF to address his mobility prior to safely returning to Indep level.   Recommendations for follow up therapy are one component of a multi-disciplinary discharge planning process, led by the attending physician.  Recommendations may be updated based on patient status, additional functional criteria and insurance authorization.  Follow Up Recommendations  Skilled nursing-short term rehab (<3 hours/day)     Assistance Recommended at Discharge Frequent or constant Supervision/Assistance  Patient can return home with the following A lot of help with walking and/or transfers   Equipment Recommendations       Recommendations for Other Services       Precautions / Restrictions Precautions Precautions: Fall Precaution Comments: Progressive PSP Restrictions Weight Bearing Restrictions: No     Mobility  Bed Mobility Overal bed mobility: Needs Assistance Bed Mobility: Supine to Sit     Supine to sit:  Supervision, Min guard     General bed mobility comments: pt is physically strong but he lacks "controlled" motor skills and tends to uses momentum to sit up to EOB.  Then he lacks "controlled" motor skills to maintain a safe unsupported static sitting balance.  He immediately falls backward and is unable to center self or correct self.    Transfers Overall  transfer level: Needs assistance Equipment used: Rolling walker (2 wheels), None Transfers: Sit to/from Stand Sit to Stand: Min assist, Mod assist, Max assist           General transfer comment: sit to stand can range any where from [employer] to Max Assist depending on height of bed/chair/toilet.  Again, pt lacks any "controlled" motor skills to slow/safely perform sit to stand.  From recliner, but was unable to self perform even using B UE's on arm rest.  Pt  using "rocking momentum" back and forth in attempt to stand but x 4 fell backward onto recliner.  Therapist had to offer Mod Assist to achieve partial upright stance. Static standing posture is poor with head forward flex nearly 90 degtrees.  Static standing balance is poor as pt begins to sway and has difficulty achieving center.  With stand to sit, again pt lack "controlled" motor skills to sit safely.  Pt tends to "fall" into recliner.  HIGH FALL RISK.    Ambulation/Gait Ambulation/Gait assistance: Min assist Gait Distance (Feet): 65 Feet Assistive device: Rolling walker (2 wheels) Gait Pattern/deviations: Step-to pattern, Decreased step length - right, Decreased step length - left, Trunk flexed, Narrow base of support, Shuffle Gait velocity: decreased     General Gait Details: Pt's gait is unsteady with short, shuffled steps and very low floor clearance.  His posture is poor with "head downward" nearly 90 degree cervical flexion.  He has good B LE stregth but lack any balance correction reaction responce.  VERY unsteady esp with turns.  HIGH FALL RISK.  BERG balance test performed (see [place]).  Also, performed/tested his backward gait and lateral steps in which pt had a Total loss of balance, Therapist corrected with holding onto gait belt.   Stairs             Wheelchair Mobility    Modified Rankin (Stroke Patients Only)       Balance Overall balance assessment: History of Falls, Needs assistance   Sitting  balance-Leahy Scale: Poor Sitting balance - Comments: pt relies on full back support Postural control: Posterior lean, Right lateral lean, Left lateral lean   Standing balance-Leahy Scale: Poor Standing balance comment: BERG balance Test performed                 Standardized Balance Assessment Standardized Balance Assessment : Berg Balance Test Berg Balance Test Sit to Stand: Needs moderate or maximal assist to stand Standing Unsupported: Needs several tries to stand 30 seconds unsupported Sitting with Back Unsupported but Feet Supported on Floor or Stool: Able to sit 10 seconds Stand to Sit: Needs assistance to sit Transfers: Needs one person to assist Standing Unsupported with Eyes Closed: Needs help to keep from falling Standing Ubsupported with Feet Together: Needs help to attain position and unable to hold for 15 seconds From Standing, Reach Forward with Outstretched Arm: Reaches forward but needs supervision From Standing Position, Pick up Object from Floor: Unable to pick up shoe, but reaches 2-5 cm (1-2") from shoe and balances independently From Standing Position, Turn to Look Behind Over each Shoulder:  Needs supervision when turning Turn 360 Degrees: Needs close supervision or verbal cueing Standing Unsupported, Alternately Place Feet on Step/Stool: Needs assistance to keep from falling or unable to try Standing Unsupported, One Foot in Front: Loses balance while stepping or standing Standing on One Leg: Unable to try or needs assist to prevent fall Total Score: 8 100% FALL RISK        Cognition Arousal/Alertness: Awake/alert Behavior During Therapy: Flat affect Overall Cognitive Status: Within Functional Limits for tasks assessed                                 General Comments: Pleasant AxO x 2.5 following all commands and able to give history.  Poor safety cognition.        Exercises      General Comments General comments (skin integrity,  edema, etc.): pt scored an 8/56 indicating 100% fall risk      Pertinent Vitals/Pain Pain Assessment Pain Assessment: Faces Faces Pain Scale: Hurts little more Pain Location: low back/R buttock "sciatic" stated pt Pain Descriptors / Indicators: Grimacing, Discomfort, Shooting Pain Intervention(s): Monitored during session, Repositioned    Home Living                          Prior Function            PT Goals (current goals can now be found in the care plan section) Progress towards PT goals: Progressing toward goals    Frequency    Min 3X/week      PT Plan Current plan remains appropriate    Co-evaluation              AM-PAC PT "6 Clicks" Mobility   Outcome Measure  Help needed turning from your back to your side while in a flat bed without using bedrails?: A Lot Help needed moving from lying on your back to sitting on the side of a flat bed without using bedrails?: A Lot Help needed moving to and from a bed to a chair (including a wheelchair)?: A Lot Help needed standing up from a chair using your arms (e.g., wheelchair or bedside chair)?: A Lot Help needed to walk in hospital room?: A Lot Help needed climbing 3-5 steps with a railing? : Total 6 Click Score: 11    End of Session Equipment Utilized During Treatment: Gait belt Activity Tolerance: Patient limited by fatigue Patient left: in chair;with call bell/phone within reach;with chair alarm set Nurse Communication: Mobility status PT Visit Diagnosis: Unsteadiness on feet (R26.81);Repeated falls (R29.6)     Time: 1191-4782 PT Time Calculation (min) (ACUTE ONLY): 24 min  Charges:  $Gait Training: 8-22 mins $Therapeutic Activity: 8-22 mins                    Rica Koyanagi  PTA Acute  Rehabilitation Services Pager      [phone] Office      [phone]

## 2022-05-17 NOTE — TOC Progression Note (Addendum)
Transition of Care [facility]) - Progression Note    Patient Details  Name: ELIYAHU BILLE MRN: [id number] Date of Birth: 05/26/1956  [facility] of [facility]) CM/SW Contact  Leeroy Cha, RN Phone Number: 05/17/2022, 11:11 AM  Clinical Narrative:    Pt sent out to area snfs for review. Unable to get passar system is down Tct brother [facility] is choice.  Tct-[facility] case is not amanged by [employer] but by [employer].  Tct-nikki at [place]. Message left to please return call.  Expected Discharge Plan and Services                           DME Arranged: Wheelchair manual DME Agency: [employer]       HH Arranged: RN, PT, OT, Nurse's Aide, Social Work [employer] Agency: [place] Date [facility] Agency Contacted: 05/14/22 Time [place]: [phone] Representative spoke with at [place]: [place] Determinants of Health ([place]) Interventions    Readmission Risk Interventions     View : No data to display.

## 2022-05-18 DIAGNOSIS — E119 Type 2 diabetes mellitus without complications: Secondary | ICD-10-CM | POA: Diagnosis not present

## 2022-05-18 DIAGNOSIS — D649 Anemia, unspecified: Secondary | ICD-10-CM | POA: Diagnosis not present

## 2022-05-18 DIAGNOSIS — F0390 Unspecified dementia without behavioral disturbance: Secondary | ICD-10-CM | POA: Diagnosis not present

## 2022-05-18 DIAGNOSIS — N400 Enlarged prostate without lower urinary tract symptoms: Secondary | ICD-10-CM | POA: Diagnosis not present

## 2022-05-18 DIAGNOSIS — G4700 Insomnia, unspecified: Secondary | ICD-10-CM | POA: Diagnosis not present

## 2022-05-18 DIAGNOSIS — R911 Solitary pulmonary nodule: Secondary | ICD-10-CM

## 2022-05-18 DIAGNOSIS — W19XXXA Unspecified fall, initial encounter: Secondary | ICD-10-CM | POA: Diagnosis not present

## 2022-05-18 DIAGNOSIS — G9608 Other cranial cerebrospinal fluid leak: Secondary | ICD-10-CM | POA: Diagnosis not present

## 2022-05-18 DIAGNOSIS — R262 Difficulty in walking, not elsewhere classified: Secondary | ICD-10-CM | POA: Diagnosis not present

## 2022-05-18 DIAGNOSIS — G231 Progressive supranuclear ophthalmoplegia [Steele-Richardson-Olszewski]: Secondary | ICD-10-CM

## 2022-05-18 LAB — GLUCOSE, CAPILLARY
Glucose-Capillary: 113 mg/dL — ABNORMAL HIGH (ref 70–99)
Glucose-Capillary: 176 mg/dL — ABNORMAL HIGH (ref 70–99)
Glucose-Capillary: 134 mg/dL — ABNORMAL HIGH (ref 70–99)
Glucose-Capillary: 147 mg/dL — ABNORMAL HIGH (ref 70–99)

## 2022-05-18 MED ORDER — METFORMIN HCL 500 MG PO TABS: ORAL_TABLET | ORAL | 0 refills | Status: DC

## 2022-05-18 NOTE — Discharge Summary (Signed)
Physician Discharge Summary  Brandon Vargas Brann VZD:[id number] DOB: 01/27/1956 DOA: 05/14/2022  PCP: [place], Raymond J, MD  Admit date: 05/14/2022 Discharge date: 05/18/2022 Admitted From: ILF Disposition:  SNF Recommendations for Outpatient Follow-up:  Follow ups as [place]. Please obtain CBC/BMP/Mag in about a week Please follow up on the following pending results: None  Home Health: Patient is discharged to SNF  Discharge Condition: Stable CODE STATUS: Full code  Follow-up Information     [facility] Follow up.   Specialty: Home Health Services Why: For  home health they can see him at current home nad at assisted living Contact information: [address] [place] [address] [phone]         [facility] Follow up.   Why: Will deliver wheelchair to current residence Contact information: [address] [place] [address] [phone]                 Hospital course 66 year old M with PMH of DM-2, progressive supranuclear palsy with recurrent fall, dementia, lung nodule followed by pulmonology (Dr. Valeta Harms) brought to ED due to recurrent fall.  CT head showed progressive bilateral subdural hygroma.  This was discussed with on-call neurosurgeon who did not feel there was clinical significance, need for further evaluation of follow-up.  He was to be discharged from ED. However, he requires maximum assist when staff got him up to ambulate leading to this hospitalization.  He is otherwise medically stable.  Therapy recommended SNF.  See individual problem list [place] for more on hospital course.   Assessment and Plan: * Ambulatory dysfunction with frequent falls likely secondary to progressing PSP He could also have autonomic dysregulation given his advanced dementia but orthostatic vitals negative.  TSH within normal. -Discontinued glipizide due to risk of hypoglycemia and fall -Fall precaution -Continue  therapy -Outpatient follow-up with neurology   Dementia without behavioral disturbance [facility]) Seen by his neurologist who recommended higher level of care.  He has recurrent fall with injuries including head laceration and bruises.  He is only oriented to self, his date of birth and place -Delirium precaution -Avoid or minimize sedating medications   Normocytic anemia Recent Labs    09/29/21 1518 02/15/22 1133 05/14/22 0740 05/15/22 0327 05/16/22 1237  HGB 15.4 13.0 11.9* 11.4* 12.1*  H&H stable.  No signs of bleeding anemia   Controlled type 2 diabetes mellitus without complication, without long-term current use of insulin (HCC) Recent A1c 6.6%. -Discontinue glipizide due to risk of hypoglycemia -Start low-dose metformin -Liberate diet  Pulmonary nodule Following with Dr. Valeta Harms.  Imaging was concerning for potential malignancy.   -Outpatient follow-up with pulmonology  GERD (gastroesophageal reflux disease) Continue protonix   Subdural hygroma Subdural hygromas have increase din interval; however, neurosurgery did not fell these were clinically significant and does not require further work up.   Insomnia Continue remeron, has been referred to see Dr. Annamaria Boots   Benign prostatic hyperplasia without lower urinary tract symptoms Continue flomax     Vital signs Vitals:   05/17/22 1323 05/17/22 2058 05/18/22 0503 05/18/22 1238  BP: 140/73 116/61 139/74 125/71  Pulse: 68 61 (!) 57 61  Temp: 98 F (36.7 C) 97.8 F (36.6 C) 97.8 F (36.6 C) 98.4 F (36.9 C)  Resp: 16 18 18 18   Height:      Weight:      SpO2: 100% 97% 99% 99%  TempSrc: Oral Oral Oral Oral  BMI (Calculated):  Discharge exam  GENERAL: No apparent distress.  Nontoxic. HEENT: MMM.  Vision and hearing grossly intact.  Healing scalp laceration NECK: Supple.  No apparent JVD.  RESP:  No IWOB.  Fair aeration bilaterally. CVS:  RRR. Heart sounds normal.  ABD/GI/GU: BS+. Abd soft, NTND.   MSK/EXT:  Moves extremities. No apparent deformity. No edema.  SKIN: Healing scalp laceration.  Bruise around left elbow.  Bruising over right hip. NEURO: Awake and alert.  Oriented to self, place, year and date of birth.  No apparent focal neuro deficit. PSYCH: Calm. Normal affect.   Discharge Instructions Discharge Instructions     Diet general   Complete by: As directed    Discharge wound care:   Complete by: As directed    Monitor scalp laceration and skin bruises around left elbow   Increase activity slowly   Complete by: As directed       Allergies as of 05/18/2022   No Known Allergies      Medication List     STOP taking these medications    AMBULATORY NON FORMULARY MEDICATION   calcium carbonate 500 MG chewable tablet Commonly known as: TUMS - dosed in mg elemental calcium   glipiZIDE 10 MG 24 hr tablet Commonly known as: GLUCOTROL XL       TAKE these medications    Accu-Chek Guide test strip Generic drug: glucose blood   acetaminophen 500 MG tablet Commonly known as: TYLENOL Take 500 mg by mouth every 6 (six) hours as needed for mild pain. What changed: Another medication with the same name was removed. Continue taking this medication, and follow the directions you see here.   BIOFREEZE EX Apply 1 application. topically daily as needed (For neck pain).   cyclobenzaprine 5 MG tablet Commonly known as: FLEXERIL Take 1 tablet (5 mg total) by mouth at bedtime as needed for muscle spasms. What changed: when to take this   diclofenac Sodium 1 % Gel Commonly known as: VOLTAREN Apply 2 g topically 4 (four) times daily. To affected joint.   donepezil 10 MG tablet Commonly known as: ARICEPT Take 1 tablet (10 mg total) by mouth daily.   metFORMIN 500 MG tablet Commonly known as: Glucophage Take 1 tablet (500 mg total) by mouth daily with breakfast for 14 days, THEN 1 tablet (500 mg total) 2 (two) times daily with a meal. Start taking on: May 18, 2022   mirtazapine 7.5 MG tablet Commonly known as: REMERON Take 7.5 mg by mouth at bedtime.   pantoprazole 40 MG tablet Commonly known as: PROTONIX Take 1 tablet (40 mg total) by mouth daily.   tamsulosin 0.4 MG Caps capsule Commonly known as: FLOMAX Take 0.4 mg by mouth daily.   Vitamin D3 25 MCG (1000 UT) Caps Take 1 capsule (1,000 Units total) by mouth daily.               Durable Medical Equipment  (From admission, onward)           Start     Ordered   05/14/22 0904  For home use only DME standard manual wheelchair with seat cushion  Once       Comments: Patient suffers from weakness, falls which impairs their ability to perform daily activities like toileting in the home.  A walker will not resolve issue with performing activities of daily living. A wheelchair will allow patient to safely perform daily activities. Patient can safely propel the wheelchair in the home or has a  caregiver who can provide assistance. Length of need Lifetime. Accessories: elevating leg rests (ELRs), wheel locks, extensions and anti-tippers.   05/14/22 0904              Discharge Care Instructions  (From admission, onward)           Start     Ordered   05/18/22 0000  Discharge wound care:       Comments: Monitor scalp laceration and skin bruises around left elbow   05/18/22 1301            Consultations: Neurosurgery over the phone  Procedures/Studies:   DG Chest 1 View  Result Date: 05/14/2022 CLINICAL DATA:  Multiple falls, pain EXAM: CHEST  1 VIEW COMPARISON:  Previous studies including the examination of 06/06/2017 FINDINGS: Cardiac size is within normal limits. There are no signs of pulmonary edema or focal pulmonary consolidation. There is no pleural effusion or pneumothorax. Deformities are noted in the left seventh, eighth and ninth ribs suggesting possible healed fractures. IMPRESSION: No active disease. Electronically Signed   By: Elmer Picker  M.D.   On: 05/14/2022 09:10   DG Elbow Complete Left  Result Date: 05/14/2022 CLINICAL DATA:  Multiple falls, pain EXAM: LEFT ELBOW - COMPLETE 3+ VIEW COMPARISON:  03/15/2022 FINDINGS: No recent fracture or dislocation is seen. There are tiny submillimeter calcific density along the medial and lateral aspect of distal humerus possibly residual calcifications from previous injury. This finding has not changed. There is no displacement of posterior fat pad. There is soft tissue swelling and subcutaneous stranding along the posterior aspect suggesting contusion/hematoma. IMPRESSION: No recent fracture or dislocation is seen in the left elbow. Electronically Signed   By: Elmer Picker M.D.   On: 05/14/2022 09:07   CT Head Wo Contrast  Result Date: 05/14/2022 CLINICAL DATA:  Moderate to severe head trauma.  Multiple falls. EXAM: CT HEAD WITHOUT CONTRAST CT CERVICAL SPINE WITHOUT CONTRAST TECHNIQUE: Multidetector CT imaging of the head and cervical spine was performed following the standard protocol without intravenous contrast. Multiplanar CT image reconstructions of the cervical spine were also generated. RADIATION DOSE REDUCTION: This exam was performed according to the departmental dose-optimization program which includes automated exposure control, adjustment of the mA and/or kV according to patient size and/or use of iterative reconstruction technique. COMPARISON:  CT scan of the brain and cervical spine May 09, 2022. CT scan of the brain and cervical spine February 15, 2022. FINDINGS: CT HEAD FINDINGS Brain: There is increasing low-density fluid over the convexities within the subdural space bilaterally. The maximum thickness of this fluid on the right is seen on coronal image 31 of series 8 measuring 11 mm in thickness today versus 9 mm previously. The low-attenuation fluid in the left subdural space is also increased but is less prominent compared to the right. There is mild effacement of underlying  sulci. No midline shift. The cerebellum, brainstem, and basal cisterns are normal. No acute cortical ischemia or infarct. Vascular: No hyperdense vessel or unexpected calcification. Skull: Normal. Negative for fracture or focal lesion. Sinuses/Orbits: No acute finding. Other: Soft tissue swelling over the left forehead. CT CERVICAL SPINE FINDINGS Alignment: Reversal of normal lordosis. Skull base and vertebrae: Prior left laminectomies at C4 and C5. No acute fractures. Soft tissues and spinal canal: No prevertebral fluid or swelling. No visible canal hematoma. Disc levels: Multilevel degenerative disc disease and facet degenerative changes. Upper chest: Negative. Other: No other abnormalities IMPRESSION: 1. Low density subdural fluid collections bilaterally.  The thickness of the low-density fluid on the right has increased from 9 mm on May 09, 2022 to 11 mm on today's study. The left-sided subdural low-density fluid has also increased in the interval from 4 mm on series 4, image 33 on the May 15 study to 8 mm today. The findings are consistent with subdural hygromas, increased in the interval. Recommend clinical correlation. 2. Soft tissue swelling over the left forehead. 3. No fracture or traumatic malalignment in the cervical spine. Degenerative changes as above. Electronically Signed   By: Dorise Bullion III M.D.   On: 05/14/2022 09:16   CT Head Wo Contrast  Result Date: 05/09/2022 CLINICAL DATA:  Head trauma, moderate-severe fall and head injury EXAM: CT HEAD WITHOUT CONTRAST TECHNIQUE: Contiguous axial images were obtained from the base of the skull through the vertex without intravenous contrast. RADIATION DOSE REDUCTION: This exam was performed according to the departmental dose-optimization program which includes automated exposure control, adjustment of the mA and/or kV according to patient size and/or use of iterative reconstruction technique. COMPARISON:  CT head February 15, 2022. FINDINGS: Brain:  Suspected small hypodense right subdural fluid collection, which measures approximately 4 mm in thickness (for example see series 4, image 33; series 3, image 24; series 5, image 17) and appears new since February 15, 2022. There appears to be some displacement of traversing cortical veins. No midline shift. Vascular: No hyperdense vessel identified. Skull: Left forehead contusion.  No acute fracture. Sinuses/Orbits: Clear visualized sinuses. No visible acute orbital findings. Other: No mastoid effusions. IMPRESSION: 1. Suspected small hypodense right subdural fluid collection, which measures approximately 4 mm in thickness and appears new since February 15, 2022. No midline shift. 2. Left forehead contusion without acute calvarial fracture. Electronically Signed   By: Margaretha Sheffield M.D.   On: 05/09/2022 12:24   CT Cervical Spine Wo Contrast  Result Date: 05/14/2022 CLINICAL DATA:  Moderate to severe head trauma.  Multiple falls. EXAM: CT HEAD WITHOUT CONTRAST CT CERVICAL SPINE WITHOUT CONTRAST TECHNIQUE: Multidetector CT imaging of the head and cervical spine was performed following the standard protocol without intravenous contrast. Multiplanar CT image reconstructions of the cervical spine were also generated. RADIATION DOSE REDUCTION: This exam was performed according to the departmental dose-optimization program which includes automated exposure control, adjustment of the mA and/or kV according to patient size and/or use of iterative reconstruction technique. COMPARISON:  CT scan of the brain and cervical spine May 09, 2022. CT scan of the brain and cervical spine February 15, 2022. FINDINGS: CT HEAD FINDINGS Brain: There is increasing low-density fluid over the convexities within the subdural space bilaterally. The maximum thickness of this fluid on the right is seen on coronal image 31 of series 8 measuring 11 mm in thickness today versus 9 mm previously. The low-attenuation fluid in the left subdural  space is also increased but is less prominent compared to the right. There is mild effacement of underlying sulci. No midline shift. The cerebellum, brainstem, and basal cisterns are normal. No acute cortical ischemia or infarct. Vascular: No hyperdense vessel or unexpected calcification. Skull: Normal. Negative for fracture or focal lesion. Sinuses/Orbits: No acute finding. Other: Soft tissue swelling over the left forehead. CT CERVICAL SPINE FINDINGS Alignment: Reversal of normal lordosis. Skull base and vertebrae: Prior left laminectomies at C4 and C5. No acute fractures. Soft tissues and spinal canal: No prevertebral fluid or swelling. No visible canal hematoma. Disc levels: Multilevel degenerative disc disease and facet degenerative changes. Upper chest: Negative.  Other: No other abnormalities IMPRESSION: 1. Low density subdural fluid collections bilaterally. The thickness of the low-density fluid on the right has increased from 9 mm on May 09, 2022 to 11 mm on today's study. The left-sided subdural low-density fluid has also increased in the interval from 4 mm on series 4, image 33 on the May 15 study to 8 mm today. The findings are consistent with subdural hygromas, increased in the interval. Recommend clinical correlation. 2. Soft tissue swelling over the left forehead. 3. No fracture or traumatic malalignment in the cervical spine. Degenerative changes as above. Electronically Signed   By: Dorise Bullion III M.D.   On: 05/14/2022 09:16   CT Cervical Spine Wo Contrast  Result Date: 05/09/2022 CLINICAL DATA:  Multiple falls since yesterday EXAM: CT CERVICAL SPINE WITHOUT CONTRAST TECHNIQUE: Multidetector CT imaging of the cervical spine was performed without intravenous contrast. Multiplanar CT image reconstructions were also generated. RADIATION DOSE REDUCTION: This exam was performed according to the departmental dose-optimization program which includes automated exposure control, adjustment of the mA  and/or kV according to patient size and/or use of iterative reconstruction technique. COMPARISON:  Cervical spine CT 02/15/2022 FINDINGS: Alignment: There is reversal of the normal cervical spine lordosis, unchanged. There is no antero or retrolisthesis. There is no jumped or perched facets or other evidence of traumatic malalignment. Skull base and vertebrae: Skull base alignment is maintained. Vertebral body heights are preserved. There is no evidence of acute fracture. Soft tissues and spinal canal: No prevertebral fluid or swelling. No visible canal hematoma. Disc levels: Disc heights are overall preserved, but there is degenerative endplate change with bulky anterior osteophytes throughout the cervical spine beginning at C4. There is advanced multilevel facet arthropathy throughout the cervical spine. Postsurgical changes reflecting posterior decompression at C3-C4 C4-C5, C5-C6, and C6-C7 are noted. There is no evidence of high-grade spinal canal stenosis. There is multilevel neural foraminal stenosis, unchanged. Upper chest: Imaged lung apices are clear. Other: None. IMPRESSION: No acute fracture or traumatic malalignment of the cervical spine. Electronically Signed   By: Valetta Mole M.D.   On: 05/09/2022 12:23   DG Hip Unilat W or Wo Pelvis 2-3 Views Left  Result Date: 05/14/2022 CLINICAL DATA:  Multiple falls, pain EXAM: DG HIP (WITH OR WITHOUT PELVIS) 2-3V LEFT COMPARISON:  None Available. FINDINGS: No recent fracture or dislocation is seen. Degenerative changes are noted in both hips, more so on the left side. IMPRESSION: No fracture or dislocation is seen in the pelvis and left hip. Degenerative changes are noted in both hips, more so in the left hip. Electronically Signed   By: Elmer Picker M.D.   On: 05/14/2022 09:09       The results of significant diagnostics from this hospitalization (including imaging, microbiology, ancillary and laboratory) are listed [place] for reference.      Microbiology: No results found for this or any previous visit (from the past 240 hour(s)).   Labs:  CBC: Recent Labs  Lab 05/14/22 0740 05/15/22 0327 05/16/22 1237  WBC 10.7* 7.6 7.9  NEUTROABS 8.9*  --   --   HGB 11.9* 11.4* 12.1*  HCT 36.8* 35.3* 36.2*  MCV 83.6 85.1 85.2  PLT 297 273 281   BMP &GFR Recent Labs  Lab 05/14/22 0740 05/15/22 0327 05/16/22 1237  NA 141 143 139  K 4.1 4.3 4.2  CL 105 111 108  CO2 27 25 24   GLUCOSE 105* 109* 162*  BUN 18 13 16   CREATININE 0.88  0.84 0.97  CALCIUM 8.7* 8.8* 8.6*   Estimated Creatinine Clearance: 82.4 mL/min (by C-G formula based on SCr of 0.97 mg/dL). Liver & Pancreas: Recent Labs  Lab 05/14/22 0740  AST 21  ALT 22  ALKPHOS 69  BILITOT 0.9  PROT 6.5  ALBUMIN 3.7   No results for input(s): LIPASE, AMYLASE in the last 168 hours. No results for input(s): AMMONIA in the last 168 hours. Diabetic: No results for input(s): HGBA1C in the last 72 hours. Recent Labs  Lab 05/17/22 1156 05/17/22 1703 05/17/22 2100 05/18/22 0732 05/18/22 1141  GLUCAP 125* 167* 142* 113* 176*   Cardiac Enzymes: No results for input(s): CKTOTAL, CKMB, CKMBINDEX, TROPONINI in the last 168 hours. No results for input(s): PROBNP in the last 8760 hours. Coagulation Profile: No results for input(s): INR, PROTIME in the last 168 hours. Thyroid Function Tests: No results for input(s): TSH, T4TOTAL, FREET4, T3FREE, THYROIDAB in the last 72 hours. Lipid Profile: No results for input(s): CHOL, HDL, LDLCALC, TRIG, CHOLHDL, LDLDIRECT in the last 72 hours. Anemia Panel: No results for input(s): VITAMINB12, FOLATE, FERRITIN, TIBC, IRON, RETICCTPCT in the last 72 hours. Urine analysis:    Component Value Date/Time   COLORURINE YELLOW 05/14/2022 [place] 05/14/2022 0724   LABSPEC 1.019 05/14/2022 0724   PHURINE 7.0 05/14/2022 0724   GLUCOSEU NEGATIVE 05/14/2022 0724   HGBUR NEGATIVE 05/14/2022 0724   BILIRUBINUR  NEGATIVE 05/14/2022 0724   KETONESUR NEGATIVE 05/14/2022 0724   PROTEINUR TRACE (A) 05/14/2022 0724   NITRITE NEGATIVE 05/14/2022 0724   LEUKOCYTESUR NEGATIVE 05/14/2022 0724   Sepsis Labs: Invalid input(s): PROCALCITONIN, LACTICIDVEN   Time coordinating discharge: 45 minutes  SIGNED:  Mercy Riding, MD  [facility] 05/18/2022, 1:13 PM

## 2022-05-18 NOTE — Progress Notes (Signed)
Cardiac monitoring discontinued per order.  Angie Fava, RN

## 2022-05-18 NOTE — Progress Notes (Signed)
Patient ambulated in hallway with assistance (~720 ft).  Used front wheel walker.  Required intermittent steadying assist.  Tolerated well.  Angie Fava, RN

## 2022-05-19 DIAGNOSIS — R911 Solitary pulmonary nodule: Secondary | ICD-10-CM | POA: Diagnosis not present

## 2022-05-19 DIAGNOSIS — G4700 Insomnia, unspecified: Secondary | ICD-10-CM | POA: Diagnosis not present

## 2022-05-19 DIAGNOSIS — G9608 Other cranial cerebrospinal fluid leak: Secondary | ICD-10-CM | POA: Diagnosis not present

## 2022-05-19 DIAGNOSIS — F0390 Unspecified dementia without behavioral disturbance: Secondary | ICD-10-CM | POA: Diagnosis not present

## 2022-05-19 DIAGNOSIS — W19XXXA Unspecified fall, initial encounter: Secondary | ICD-10-CM | POA: Diagnosis not present

## 2022-05-19 DIAGNOSIS — E119 Type 2 diabetes mellitus without complications: Secondary | ICD-10-CM | POA: Diagnosis not present

## 2022-05-19 DIAGNOSIS — R4182 Altered mental status, unspecified: Secondary | ICD-10-CM | POA: Diagnosis not present

## 2022-05-19 DIAGNOSIS — D649 Anemia, unspecified: Secondary | ICD-10-CM | POA: Diagnosis not present

## 2022-05-19 DIAGNOSIS — Z7401 Bed confinement status: Secondary | ICD-10-CM | POA: Diagnosis not present

## 2022-05-19 DIAGNOSIS — R262 Difficulty in walking, not elsewhere classified: Secondary | ICD-10-CM | POA: Diagnosis not present

## 2022-05-19 DIAGNOSIS — N400 Enlarged prostate without lower urinary tract symptoms: Secondary | ICD-10-CM | POA: Diagnosis not present

## 2022-05-19 LAB — GLUCOSE, CAPILLARY
Glucose-Capillary: 100 mg/dL — ABNORMAL HIGH (ref 70–99)
Glucose-Capillary: 258 mg/dL — ABNORMAL HIGH (ref 70–99)
Glucose-Capillary: 114 mg/dL — ABNORMAL HIGH (ref 70–99)
Glucose-Capillary: 160 mg/dL — ABNORMAL HIGH (ref 70–99)

## 2022-05-19 NOTE — Progress Notes (Signed)
Report called to [employer].  Report given to [facility].

## 2022-05-19 NOTE — Progress Notes (Signed)
Occupational Therapy Treatment Patient Details Name: Brandon Vargas MRN: [id number] DOB: 1955-12-27 Today's Date: 05/19/2022   History of present illness 66 year old with past medical history significant for diabetes type 2, insomnia, progressive supranuclear palsy with recurrent fall, dementia, lung nodule followed by Dr. Katy Fitch presents to the ED complaining of fall the morning of admission.  Patient reported that he has fall, about 4 times and he had a hard time getting up.     CT showed progressive subdural hygroma, neurosurgery was called   OT comments  Treatment focused on standing grooming task to challenge balance and working on improving safety and technique for sit to stands. Patient exhibits a posterior lean in sitting, uses momentum, grabs the walker with both hand, and wants to propel him self up with the back of his legs, and does not control his descent with sitting. Therapist worked with patient on sit to stands - scooting forward, leaning forward at the waist, and pushing up with at least one hand to stand. He tends to repeatedly try to stand multiple times and just continuing falling backwards. He required tactile and verbal cues to perform safe sit to stand. He still does not lean forward enough  - unsure if due to stiffness at waist or because of his forward head he thinks he further forward than he is. He tends to reset back to his bad habits. He is able to state some prior learning stating "Nose over toes" but doesn't actually get nose over toes. Patient needs consistent and repetitive practice. Continue to recommend short term rehab.      Recommendations for follow up therapy are one component of a multi-disciplinary discharge planning process, led by the attending physician.  Recommendations may be updated based on patient status, additional functional criteria and insurance authorization.    Follow Up Recommendations  Skilled nursing-short term rehab (<3 hours/day)    Assistance  Recommended at Discharge Frequent or constant Supervision/Assistance  Patient can return home with the following  Direct supervision/assist for medications management;Direct supervision/assist for financial management;Assistance with cooking/housework;A little help with bathing/dressing/bathroom;A little help with walking and/or transfers   Equipment Recommendations  None recommended by OT    Recommendations for Other Services      Precautions / Restrictions Precautions Precautions: Fall Precaution Comments: Progressive PSP Restrictions Weight Bearing Restrictions: No       Mobility Bed Mobility Overal bed mobility: Needs Assistance Bed Mobility: Supine to Sit     Supine to sit: Min assist     General bed mobility comments: Min assist and increased time to transfer to edge of bed. Patient falling back posteriorly without assistance.    Transfers Overall transfer level: Needs assistance Equipment used: Rolling walker (2 wheels) Transfers: Sit to/from Stand Sit to Stand: Min assist, From elevated surface           General transfer comment: Performed 3 sit to stands from bed - working on leaning forward and not pulling on walker. Also performed 7 sit to stands from recliner with pillows.     Balance Overall balance assessment: Needs assistance Sitting-balance support: No upper extremity supported, Feet supported Sitting balance-Leahy Scale: Fair   Postural control: Posterior lean Standing balance support: During functional activity Standing balance-Leahy Scale: Fair                             ADL either performed or assessed with clinical judgement   ADL Overall ADL's :  Needs assistance/impaired     Grooming: Min guard;Standing;Oral care Grooming Details (indicate cue type and reason): stood at sink to perform oral care. Needed cue to lean further forward so water would go in the sink                                     Extremity/Trunk Assessment Upper Extremity Assessment Upper Extremity Assessment: Overall WFL for tasks assessed            Vision Patient Visual Report: No change from baseline     Perception     Praxis      Cognition Arousal/Alertness: Awake/alert Behavior During Therapy: Flat affect Overall Cognitive Status: Within Functional Limits for tasks assessed                                 General Comments: Pleasant AxO x 2.5 following all commands and able to give history.  Poor safety cognition. Appears to have grossly functional memory. Able to tell me his brother will be calling and able to repeat back something therapist told him.        Exercises      Shoulder Instructions       General Comments      Pertinent Vitals/ Pain       Pain Assessment Pain Assessment: No/denies pain  Home Living                                          Prior Functioning/Environment              Frequency  Min 2X/week        Progress Toward Goals  OT Goals(current goals can now be found in the care plan section)  Progress towards OT goals: Progressing toward goals  Acute Rehab OT Goals Time For Goal Achievement: 05/29/22 Potential to Achieve Goals: Good  Plan Discharge plan remains appropriate    Co-evaluation                 AM-PAC OT "6 Clicks" Daily Activity     Outcome Measure   Help from another person eating meals?: None Help from another person taking care of personal grooming?: A Little Help from another person toileting, which includes using toliet, bedpan, or urinal?: A Little Help from another person bathing (including washing, rinsing, drying)?: A Little Help from another person to put on and taking off regular upper body clothing?: A Little Help from another person to put on and taking off regular lower body clothing?: A Little 6 Click Score: 19    End of Session Equipment Utilized During Treatment:  Rolling walker (2 wheels);Gait belt  OT Visit Diagnosis: Repeated falls (R29.6)   Activity Tolerance Patient tolerated treatment well   Patient Left in chair;with call bell/phone within reach;with chair alarm set   Nurse Communication Mobility status        Time: 0912-0933 OT Time Calculation (min): 21 min  Charges: OT General Charges $OT Visit: 1 Visit OT Treatments $Therapeutic Activity: 8-22 mins  Geselle Cardosa, OTR/L [place]  Office [phone] Pager: [place] 05/19/2022, 10:20 AM

## 2022-05-19 NOTE — TOC Progression Note (Addendum)
Transition of Care [facility]) - Progression Note    Patient Details  Name: Brandon Vargas MRN: [id number] Date of Birth: 1956/12/11  [facility] CM/SW Contact  Leeroy Cha, RN Phone Number: 05/19/2022, 9:12 AM  Clinical Narrative:    Tawni Carnes obtained can accept pt [facility] [place] [facility] Per [facility] will get papers signed by the brother by email around 1030 this am and will give okay to come from there.        Expected Discharge Plan and Services           Expected Discharge Date: 05/18/22               DME Arranged: Wheelchair manual DME Agency: [employer]       HH Arranged: RN, PT, OT, Nurse's Aide, Social Work [employer] Agency: [place] [facility] Agency Contacted: 05/14/22 Time [place]: [phone] Representative spoke with at [place]: Tommi Rumps   Social Determinants of Health ([place]) Interventions    Readmission Risk Interventions     View : No data to display.

## 2022-05-19 NOTE — Discharge Summary (Signed)
Physician Discharge Summary  Brandon Vargas IPJ:[id number] DOB: 11-09-1956 DOA: 05/14/2022  PCP: [place], Raymond J, MD  Admit date: 05/14/2022 Discharge date: 05/19/2022 Admitted From: ILF Disposition:  SNF Recommendations for Outpatient Follow-up:  Follow ups as [facility]. Please obtain CBC/BMP/Mag in about a week Please follow up on the following pending results: None  Home Health: Patient is discharged to SNF  Discharge Condition: Stable CODE STATUS: Full code  Follow-up Information     [facility] Follow up.   Specialty: Home Health Services Why: For  home health they can see him at current home nad at assisted living Contact information: [address] [place] [address] [phone]         [facility] Follow up.   Why: Will deliver wheelchair to current residence Contact information: [address]  [address] [phone]                 Hospital course 66 year old M with PMH of DM-2, progressive supranuclear palsy with recurrent fall, dementia, lung nodule followed by pulmonology (Dr. Valeta Harms) brought to ED due to recurrent fall.  CT head showed progressive bilateral subdural hygroma.  This was discussed with on-call neurosurgeon who did not feel there was clinical significance, need for further evaluation of follow-up.  He was to be discharged from ED. However, he requires maximum assist when staff got him up to ambulate leading to this hospitalization.  He is otherwise medically stable.  Therapy recommended SNF.  See individual problem list [place] for more on hospital course.   Assessment and Plan: * Ambulatory dysfunction with frequent falls likely secondary to progressing PSP He could also have autonomic dysregulation given his advanced dementia but orthostatic vitals negative.  TSH within normal. -Discontinued glipizide due to risk of hypoglycemia and fall -Fall precaution -Continue  therapy -Outpatient follow-up with neurology   Dementia without behavioral disturbance [facility]) Seen by his neurologist who recommended higher level of care.  He has recurrent fall with injuries including head laceration and bruises.  He is only oriented to self, his date of birth and place -Delirium precaution -Avoid or minimize sedating medications   Normocytic anemia Recent Labs    09/29/21 1518 02/15/22 1133 05/14/22 0740 05/15/22 0327 05/16/22 1237  HGB 15.4 13.0 11.9* 11.4* 12.1*  H&H stable.  No signs of bleeding anemia   Controlled type 2 diabetes mellitus without complication, without long-term current use of insulin (HCC) Recent A1c 6.6%. -Discontinue glipizide due to risk of hypoglycemia -Start low-dose metformin -Liberate diet  Pulmonary nodule Following with Dr. Valeta Harms.  Imaging was concerning for potential malignancy.   -Outpatient follow-up with pulmonology  GERD (gastroesophageal reflux disease) Continue protonix   Subdural hygroma Subdural hygromas have increase din interval; however, neurosurgery did not fell these were clinically significant and does not require further work up.   Insomnia Continue remeron, has been referred to see Dr. Annamaria Boots   Benign prostatic hyperplasia without lower urinary tract symptoms Continue flomax     Vital signs Vitals:   05/18/22 0503 05/18/22 1238 05/18/22 2136 05/19/22 0556  BP: 139/74 125/71 130/77 127/76  Pulse: (!) 57 61 62 60  Temp: 97.8 F (36.6 C) 98.4 F (36.9 C) 97.7 F (36.5 C) 97.8 F (36.6 C)  Resp: 18 18 20 18   Height:      Weight:      SpO2: 99% 99% 100% 99%  TempSrc: Oral Oral Oral Oral  BMI (Calculated):  Discharge exam  GENERAL: No apparent distress.  Nontoxic. HEENT: MMM.  Vision and hearing grossly intact.  Healing scalp laceration NECK: Supple.  No apparent JVD.  RESP:  No IWOB.  Fair aeration bilaterally. CVS:  RRR. Heart sounds normal.  ABD/GI/GU: BS+. Abd soft, NTND.   MSK/EXT:  Moves extremities. No apparent deformity. No edema.  SKIN: Healing scalp laceration.  Bruise around left elbow.  Bruising over right hip. NEURO: Awake and alert.  Oriented to self, place, year and date of birth.  No apparent focal neuro deficit. PSYCH: Calm. Normal affect.   Discharge Instructions Discharge Instructions     Diet general   Complete by: As directed    Discharge wound care:   Complete by: As directed    Monitor scalp laceration and skin bruises around left elbow   Increase activity slowly   Complete by: As directed       Allergies as of 05/19/2022   No Known Allergies      Medication List     STOP taking these medications    AMBULATORY NON FORMULARY MEDICATION   calcium carbonate 500 MG chewable tablet Commonly known as: TUMS - dosed in mg elemental calcium   glipiZIDE 10 MG 24 hr tablet Commonly known as: GLUCOTROL XL       TAKE these medications    Accu-Chek Guide test strip Generic drug: glucose blood   acetaminophen 500 MG tablet Commonly known as: TYLENOL Take 500 mg by mouth every 6 (six) hours as needed for mild pain. What changed: Another medication with the same name was removed. Continue taking this medication, and follow the directions you see here.   BIOFREEZE EX Apply 1 application. topically daily as needed (For neck pain).   cyclobenzaprine 5 MG tablet Commonly known as: FLEXERIL Take 1 tablet (5 mg total) by mouth at bedtime as needed for muscle spasms. What changed: when to take this   diclofenac Sodium 1 % Gel Commonly known as: VOLTAREN Apply 2 g topically 4 (four) times daily. To affected joint.   donepezil 10 MG tablet Commonly known as: ARICEPT Take 1 tablet (10 mg total) by mouth daily.   metFORMIN 500 MG tablet Commonly known as: Glucophage Take 1 tablet (500 mg total) by mouth daily with breakfast for 14 days, THEN 1 tablet (500 mg total) 2 (two) times daily with a meal. Start taking on: May 18, 2022   mirtazapine 7.5 MG tablet Commonly known as: REMERON Take 7.5 mg by mouth at bedtime.   pantoprazole 40 MG tablet Commonly known as: PROTONIX Take 1 tablet (40 mg total) by mouth daily.   tamsulosin 0.4 MG Caps capsule Commonly known as: FLOMAX Take 0.4 mg by mouth daily.   Vitamin D3 25 MCG (1000 UT) Caps Take 1 capsule (1,000 Units total) by mouth daily.               Durable Medical Equipment  (From admission, onward)           Start     Ordered   05/14/22 0904  For home use only DME standard manual wheelchair with seat cushion  Once       Comments: Patient suffers from weakness, falls which impairs their ability to perform daily activities like toileting in the home.  A walker will not resolve issue with performing activities of daily living. A wheelchair will allow patient to safely perform daily activities. Patient can safely propel the wheelchair in the home or has a  caregiver who can provide assistance. Length of need Lifetime. Accessories: elevating leg rests (ELRs), wheel locks, extensions and anti-tippers.   05/14/22 0904              Discharge Care Instructions  (From admission, onward)           Start     Ordered   05/18/22 0000  Discharge wound care:       Comments: Monitor scalp laceration and skin bruises around left elbow   05/18/22 1301            Consultations: Neurosurgery over the phone  Procedures/Studies:   DG Chest 1 View  Result Date: 05/14/2022 CLINICAL DATA:  Multiple falls, pain EXAM: CHEST  1 VIEW COMPARISON:  Previous studies including the examination of 06/06/2017 FINDINGS: Cardiac size is within normal limits. There are no signs of pulmonary edema or focal pulmonary consolidation. There is no pleural effusion or pneumothorax. Deformities are noted in the left seventh, eighth and ninth ribs suggesting possible healed fractures. IMPRESSION: No active disease. Electronically Signed   By: Elmer Picker  M.D.   On: 05/14/2022 09:10   DG Elbow Complete Left  Result Date: 05/14/2022 CLINICAL DATA:  Multiple falls, pain EXAM: LEFT ELBOW - COMPLETE 3+ VIEW COMPARISON:  03/15/2022 FINDINGS: No recent fracture or dislocation is seen. There are tiny submillimeter calcific density along the medial and lateral aspect of distal humerus possibly residual calcifications from previous injury. This finding has not changed. There is no displacement of posterior fat pad. There is soft tissue swelling and subcutaneous stranding along the posterior aspect suggesting contusion/hematoma. IMPRESSION: No recent fracture or dislocation is seen in the left elbow. Electronically Signed   By: Elmer Picker M.D.   On: 05/14/2022 09:07   CT Head Wo Contrast  Result Date: 05/14/2022 CLINICAL DATA:  Moderate to severe head trauma.  Multiple falls. EXAM: CT HEAD WITHOUT CONTRAST CT CERVICAL SPINE WITHOUT CONTRAST TECHNIQUE: Multidetector CT imaging of the head and cervical spine was performed following the standard protocol without intravenous contrast. Multiplanar CT image reconstructions of the cervical spine were also generated. RADIATION DOSE REDUCTION: This exam was performed according to the departmental dose-optimization program which includes automated exposure control, adjustment of the mA and/or kV according to patient size and/or use of iterative reconstruction technique. COMPARISON:  CT scan of the brain and cervical spine May 09, 2022. CT scan of the brain and cervical spine February 15, 2022. FINDINGS: CT HEAD FINDINGS Brain: There is increasing low-density fluid over the convexities within the subdural space bilaterally. The maximum thickness of this fluid on the right is seen on coronal image 31 of series 8 measuring 11 mm in thickness today versus 9 mm previously. The low-attenuation fluid in the left subdural space is also increased but is less prominent compared to the right. There is mild effacement of underlying  sulci. No midline shift. The cerebellum, brainstem, and basal cisterns are normal. No acute cortical ischemia or infarct. Vascular: No hyperdense vessel or unexpected calcification. Skull: Normal. Negative for fracture or focal lesion. Sinuses/Orbits: No acute finding. Other: Soft tissue swelling over the left forehead. CT CERVICAL SPINE FINDINGS Alignment: Reversal of normal lordosis. Skull base and vertebrae: Prior left laminectomies at C4 and C5. No acute fractures. Soft tissues and spinal canal: No prevertebral fluid or swelling. No visible canal hematoma. Disc levels: Multilevel degenerative disc disease and facet degenerative changes. Upper chest: Negative. Other: No other abnormalities IMPRESSION: 1. Low density subdural fluid collections bilaterally.  The thickness of the low-density fluid on the right has increased from 9 mm on May 09, 2022 to 11 mm on today's study. The left-sided subdural low-density fluid has also increased in the interval from 4 mm on series 4, image 33 on the May 15 study to 8 mm today. The findings are consistent with subdural hygromas, increased in the interval. Recommend clinical correlation. 2. Soft tissue swelling over the left forehead. 3. No fracture or traumatic malalignment in the cervical spine. Degenerative changes as above. Electronically Signed   By: Dorise Bullion III M.D.   On: 05/14/2022 09:16   CT Head Wo Contrast  Result Date: 05/09/2022 CLINICAL DATA:  Head trauma, moderate-severe fall and head injury EXAM: CT HEAD WITHOUT CONTRAST TECHNIQUE: Contiguous axial images were obtained from the base of the skull through the vertex without intravenous contrast. RADIATION DOSE REDUCTION: This exam was performed according to the departmental dose-optimization program which includes automated exposure control, adjustment of the mA and/or kV according to patient size and/or use of iterative reconstruction technique. COMPARISON:  CT head February 15, 2022. FINDINGS: Brain:  Suspected small hypodense right subdural fluid collection, which measures approximately 4 mm in thickness (for example see series 4, image 33; series 3, image 24; series 5, image 17) and appears new since February 15, 2022. There appears to be some displacement of traversing cortical veins. No midline shift. Vascular: No hyperdense vessel identified. Skull: Left forehead contusion.  No acute fracture. Sinuses/Orbits: Clear visualized sinuses. No visible acute orbital findings. Other: No mastoid effusions. IMPRESSION: 1. Suspected small hypodense right subdural fluid collection, which measures approximately 4 mm in thickness and appears new since February 15, 2022. No midline shift. 2. Left forehead contusion without acute calvarial fracture. Electronically Signed   By: Margaretha Sheffield M.D.   On: 05/09/2022 12:24   CT Cervical Spine Wo Contrast  Result Date: 05/14/2022 CLINICAL DATA:  Moderate to severe head trauma.  Multiple falls. EXAM: CT HEAD WITHOUT CONTRAST CT CERVICAL SPINE WITHOUT CONTRAST TECHNIQUE: Multidetector CT imaging of the head and cervical spine was performed following the standard protocol without intravenous contrast. Multiplanar CT image reconstructions of the cervical spine were also generated. RADIATION DOSE REDUCTION: This exam was performed according to the departmental dose-optimization program which includes automated exposure control, adjustment of the mA and/or kV according to patient size and/or use of iterative reconstruction technique. COMPARISON:  CT scan of the brain and cervical spine May 09, 2022. CT scan of the brain and cervical spine February 15, 2022. FINDINGS: CT HEAD FINDINGS Brain: There is increasing low-density fluid over the convexities within the subdural space bilaterally. The maximum thickness of this fluid on the right is seen on coronal image 31 of series 8 measuring 11 mm in thickness today versus 9 mm previously. The low-attenuation fluid in the left subdural  space is also increased but is less prominent compared to the right. There is mild effacement of underlying sulci. No midline shift. The cerebellum, brainstem, and basal cisterns are normal. No acute cortical ischemia or infarct. Vascular: No hyperdense vessel or unexpected calcification. Skull: Normal. Negative for fracture or focal lesion. Sinuses/Orbits: No acute finding. Other: Soft tissue swelling over the left forehead. CT CERVICAL SPINE FINDINGS Alignment: Reversal of normal lordosis. Skull base and vertebrae: Prior left laminectomies at C4 and C5. No acute fractures. Soft tissues and spinal canal: No prevertebral fluid or swelling. No visible canal hematoma. Disc levels: Multilevel degenerative disc disease and facet degenerative changes. Upper chest: Negative.  Other: No other abnormalities IMPRESSION: 1. Low density subdural fluid collections bilaterally. The thickness of the low-density fluid on the right has increased from 9 mm on May 09, 2022 to 11 mm on today's study. The left-sided subdural low-density fluid has also increased in the interval from 4 mm on series 4, image 33 on the May 15 study to 8 mm today. The findings are consistent with subdural hygromas, increased in the interval. Recommend clinical correlation. 2. Soft tissue swelling over the left forehead. 3. No fracture or traumatic malalignment in the cervical spine. Degenerative changes as above. Electronically Signed   By: Dorise Bullion III M.D.   On: 05/14/2022 09:16   CT Cervical Spine Wo Contrast  Result Date: 05/09/2022 CLINICAL DATA:  Multiple falls since yesterday EXAM: CT CERVICAL SPINE WITHOUT CONTRAST TECHNIQUE: Multidetector CT imaging of the cervical spine was performed without intravenous contrast. Multiplanar CT image reconstructions were also generated. RADIATION DOSE REDUCTION: This exam was performed according to the departmental dose-optimization program which includes automated exposure control, adjustment of the mA  and/or kV according to patient size and/or use of iterative reconstruction technique. COMPARISON:  Cervical spine CT 02/15/2022 FINDINGS: Alignment: There is reversal of the normal cervical spine lordosis, unchanged. There is no antero or retrolisthesis. There is no jumped or perched facets or other evidence of traumatic malalignment. Skull base and vertebrae: Skull base alignment is maintained. Vertebral body heights are preserved. There is no evidence of acute fracture. Soft tissues and spinal canal: No prevertebral fluid or swelling. No visible canal hematoma. Disc levels: Disc heights are overall preserved, but there is degenerative endplate change with bulky anterior osteophytes throughout the cervical spine beginning at C4. There is advanced multilevel facet arthropathy throughout the cervical spine. Postsurgical changes reflecting posterior decompression at C3-C4 C4-C5, C5-C6, and C6-C7 are noted. There is no evidence of high-grade spinal canal stenosis. There is multilevel neural foraminal stenosis, unchanged. Upper chest: Imaged lung apices are clear. Other: None. IMPRESSION: No acute fracture or traumatic malalignment of the cervical spine. Electronically Signed   By: Valetta Mole M.D.   On: 05/09/2022 12:23   DG Hip Unilat W or Wo Pelvis 2-3 Views Left  Result Date: 05/14/2022 CLINICAL DATA:  Multiple falls, pain EXAM: DG HIP (WITH OR WITHOUT PELVIS) 2-3V LEFT COMPARISON:  None Available. FINDINGS: No recent fracture or dislocation is seen. Degenerative changes are noted in both hips, more so on the left side. IMPRESSION: No fracture or dislocation is seen in the pelvis and left hip. Degenerative changes are noted in both hips, more so in the left hip. Electronically Signed   By: Elmer Picker M.D.   On: 05/14/2022 09:09       The results of significant diagnostics from this hospitalization (including imaging, microbiology, ancillary and laboratory) are listed [place] for reference.      Microbiology: No results found for this or any previous visit (from the past 240 hour(s)).   Labs:  CBC: Recent Labs  Lab 05/14/22 0740 05/15/22 0327 05/16/22 1237  WBC 10.7* 7.6 7.9  NEUTROABS 8.9*  --   --   HGB 11.9* 11.4* 12.1*  HCT 36.8* 35.3* 36.2*  MCV 83.6 85.1 85.2  PLT 297 273 281   BMP &GFR Recent Labs  Lab 05/14/22 0740 05/15/22 0327 05/16/22 1237  NA 141 143 139  K 4.1 4.3 4.2  CL 105 111 108  CO2 27 25 24   GLUCOSE 105* 109* 162*  BUN 18 13 16   CREATININE 0.88  0.84 0.97  CALCIUM 8.7* 8.8* 8.6*   Estimated Creatinine Clearance: 82.4 mL/min (by C-G formula based on SCr of 0.97 mg/dL). Liver & Pancreas: Recent Labs  Lab 05/14/22 0740  AST 21  ALT 22  ALKPHOS 69  BILITOT 0.9  PROT 6.5  ALBUMIN 3.7   No results for input(s): LIPASE, AMYLASE in the last 168 hours. No results for input(s): AMMONIA in the last 168 hours. Diabetic: No results for input(s): HGBA1C in the last 72 hours. Recent Labs  Lab 05/18/22 0732 05/18/22 1141 05/18/22 1658 05/18/22 2139 05/19/22 0724  GLUCAP 113* 176* 134* 147* 100*   Cardiac Enzymes: No results for input(s): CKTOTAL, CKMB, CKMBINDEX, TROPONINI in the last 168 hours. No results for input(s): PROBNP in the last 8760 hours. Coagulation Profile: No results for input(s): INR, PROTIME in the last 168 hours. Thyroid Function Tests: No results for input(s): TSH, T4TOTAL, FREET4, T3FREE, THYROIDAB in the last 72 hours. Lipid Profile: No results for input(s): CHOL, HDL, LDLCALC, TRIG, CHOLHDL, LDLDIRECT in the last 72 hours. Anemia Panel: No results for input(s): VITAMINB12, FOLATE, FERRITIN, TIBC, IRON, RETICCTPCT in the last 72 hours. Urine analysis:    Component Value Date/Time   COLORURINE YELLOW 05/14/2022 [place] 05/14/2022 0724   LABSPEC 1.019 05/14/2022 0724   PHURINE 7.0 05/14/2022 0724   GLUCOSEU NEGATIVE 05/14/2022 0724   HGBUR NEGATIVE 05/14/2022 0724   BILIRUBINUR  NEGATIVE 05/14/2022 0724   KETONESUR NEGATIVE 05/14/2022 0724   PROTEINUR TRACE (A) 05/14/2022 0724   NITRITE NEGATIVE 05/14/2022 0724   LEUKOCYTESUR NEGATIVE 05/14/2022 0724   Sepsis Labs: Invalid input(s): PROCALCITONIN, LACTICIDVEN   Time coordinating discharge: 45 minutes  SIGNED:  Mercy Riding, MD  [facility] 05/19/2022, 9:12 AM

## 2022-05-19 NOTE — TOC Transition Note (Signed)
Transition of Care [facility]) - CM/SW Discharge Note   Patient Details  Name: Brandon Vargas MRN: [id number] Date of Birth: Aug 20, 1956  Transition of Care [facility]) CM/SW Contact:  Leeroy Cha, RN Phone Number: 05/19/2022, 3:11 PM   Clinical Narrative:    Tcf-nikki-can come to [facility]/ ptar called for transport at 1500.         Patient Goals and CMS Choice        Discharge Placement                       Discharge Plan and Services                DME Arranged: Wheelchair manual DME Agency: [facility]       HH Arranged: RN, PT, OT, Nurse's Aide, Social Work [employer] Agency: [place] Date [facility] Agency Contacted: 05/14/22 Time [place]: [phone] Representative spoke with at [place]: Tommi Rumps  Social Determinants of Health ([place]) Interventions     Readmission Risk Interventions     View : No data to display.

## 2022-05-20 ENCOUNTER — Ambulatory Visit (HOSPITAL_BASED_OUTPATIENT_CLINIC_OR_DEPARTMENT_OTHER): Payer: Medicare HMO

## 2022-05-20 DIAGNOSIS — N400 Enlarged prostate without lower urinary tract symptoms: Secondary | ICD-10-CM | POA: Diagnosis not present

## 2022-05-20 DIAGNOSIS — K219 Gastro-esophageal reflux disease without esophagitis: Secondary | ICD-10-CM | POA: Diagnosis not present

## 2022-05-20 DIAGNOSIS — D649 Anemia, unspecified: Secondary | ICD-10-CM | POA: Diagnosis not present

## 2022-05-20 DIAGNOSIS — Z9181 History of falling: Secondary | ICD-10-CM | POA: Diagnosis not present

## 2022-05-20 DIAGNOSIS — S065X0A Traumatic subdural hemorrhage without loss of consciousness, initial encounter: Secondary | ICD-10-CM | POA: Diagnosis not present

## 2022-05-20 DIAGNOSIS — M130 Polyarthritis, unspecified: Secondary | ICD-10-CM | POA: Diagnosis not present

## 2022-05-20 DIAGNOSIS — R4182 Altered mental status, unspecified: Secondary | ICD-10-CM | POA: Diagnosis not present

## 2022-05-20 DIAGNOSIS — D464 Refractory anemia, unspecified: Secondary | ICD-10-CM | POA: Diagnosis not present

## 2022-05-20 DIAGNOSIS — S0101XA Laceration without foreign body of scalp, initial encounter: Secondary | ICD-10-CM | POA: Diagnosis not present

## 2022-05-20 DIAGNOSIS — S199XXA Unspecified injury of neck, initial encounter: Secondary | ICD-10-CM | POA: Diagnosis not present

## 2022-05-20 DIAGNOSIS — E1140 Type 2 diabetes mellitus with diabetic neuropathy, unspecified: Secondary | ICD-10-CM | POA: Diagnosis not present

## 2022-05-20 DIAGNOSIS — I69891 Dysphagia following other cerebrovascular disease: Secondary | ICD-10-CM | POA: Diagnosis not present

## 2022-05-20 DIAGNOSIS — I10 Essential (primary) hypertension: Secondary | ICD-10-CM | POA: Diagnosis not present

## 2022-05-20 DIAGNOSIS — I69391 Dysphagia following cerebral infarction: Secondary | ICD-10-CM | POA: Diagnosis not present

## 2022-05-20 DIAGNOSIS — I69828 Other speech and language deficits following other cerebrovascular disease: Secondary | ICD-10-CM | POA: Diagnosis not present

## 2022-05-20 DIAGNOSIS — S0091XA Abrasion of unspecified part of head, initial encounter: Secondary | ICD-10-CM | POA: Diagnosis not present

## 2022-05-20 DIAGNOSIS — F0390 Unspecified dementia without behavioral disturbance: Secondary | ICD-10-CM | POA: Diagnosis not present

## 2022-05-20 DIAGNOSIS — D181 Lymphangioma, any site: Secondary | ICD-10-CM | POA: Diagnosis not present

## 2022-05-20 DIAGNOSIS — Z7401 Bed confinement status: Secondary | ICD-10-CM | POA: Diagnosis not present

## 2022-05-20 DIAGNOSIS — G9608 Other cranial cerebrospinal fluid leak: Secondary | ICD-10-CM | POA: Diagnosis not present

## 2022-05-20 DIAGNOSIS — R911 Solitary pulmonary nodule: Secondary | ICD-10-CM | POA: Diagnosis not present

## 2022-05-20 DIAGNOSIS — Z79899 Other long term (current) drug therapy: Secondary | ICD-10-CM | POA: Diagnosis not present

## 2022-05-20 DIAGNOSIS — W19XXXA Unspecified fall, initial encounter: Secondary | ICD-10-CM | POA: Diagnosis not present

## 2022-05-20 DIAGNOSIS — S0990XA Unspecified injury of head, initial encounter: Secondary | ICD-10-CM | POA: Diagnosis not present

## 2022-05-20 DIAGNOSIS — G231 Progressive supranuclear ophthalmoplegia [Steele-Richardson-Olszewski]: Secondary | ICD-10-CM | POA: Diagnosis not present

## 2022-05-20 DIAGNOSIS — M6281 Muscle weakness (generalized): Secondary | ICD-10-CM | POA: Diagnosis not present

## 2022-05-20 DIAGNOSIS — W01198A Fall on same level from slipping, tripping and stumbling with subsequent striking against other object, initial encounter: Secondary | ICD-10-CM | POA: Diagnosis not present

## 2022-05-20 DIAGNOSIS — E119 Type 2 diabetes mellitus without complications: Secondary | ICD-10-CM | POA: Diagnosis not present

## 2022-05-20 DIAGNOSIS — R2689 Other abnormalities of gait and mobility: Secondary | ICD-10-CM | POA: Diagnosis not present

## 2022-05-20 DIAGNOSIS — R41841 Cognitive communication deficit: Secondary | ICD-10-CM | POA: Diagnosis not present

## 2022-05-23 DIAGNOSIS — N400 Enlarged prostate without lower urinary tract symptoms: Secondary | ICD-10-CM | POA: Diagnosis not present

## 2022-05-23 DIAGNOSIS — W19XXXA Unspecified fall, initial encounter: Secondary | ICD-10-CM | POA: Diagnosis not present

## 2022-05-23 DIAGNOSIS — M6281 Muscle weakness (generalized): Secondary | ICD-10-CM | POA: Diagnosis not present

## 2022-05-23 DIAGNOSIS — E119 Type 2 diabetes mellitus without complications: Secondary | ICD-10-CM | POA: Diagnosis not present

## 2022-05-25 DIAGNOSIS — Z9181 History of falling: Secondary | ICD-10-CM | POA: Diagnosis not present

## 2022-05-25 DIAGNOSIS — M6281 Muscle weakness (generalized): Secondary | ICD-10-CM | POA: Diagnosis not present

## 2022-05-26 ENCOUNTER — Ambulatory Visit: Payer: Medicare HMO | Admitting: Pulmonary Disease

## 2022-05-26 DIAGNOSIS — M6281 Muscle weakness (generalized): Secondary | ICD-10-CM | POA: Diagnosis not present

## 2022-05-26 DIAGNOSIS — Z9181 History of falling: Secondary | ICD-10-CM | POA: Diagnosis not present

## 2022-05-26 DIAGNOSIS — R911 Solitary pulmonary nodule: Secondary | ICD-10-CM | POA: Diagnosis not present

## 2022-05-26 DIAGNOSIS — N400 Enlarged prostate without lower urinary tract symptoms: Secondary | ICD-10-CM | POA: Diagnosis not present

## 2022-05-30 DIAGNOSIS — D464 Refractory anemia, unspecified: Secondary | ICD-10-CM | POA: Diagnosis not present

## 2022-05-30 DIAGNOSIS — S0091XA Abrasion of unspecified part of head, initial encounter: Secondary | ICD-10-CM | POA: Diagnosis not present

## 2022-06-02 ENCOUNTER — Ambulatory Visit (HOSPITAL_BASED_OUTPATIENT_CLINIC_OR_DEPARTMENT_OTHER): Payer: Medicare HMO | Admitting: Family Medicine

## 2022-06-03 ENCOUNTER — Encounter (HOSPITAL_COMMUNITY): Payer: Self-pay

## 2022-06-03 ENCOUNTER — Emergency Department (HOSPITAL_COMMUNITY): Payer: Medicare HMO

## 2022-06-03 ENCOUNTER — Other Ambulatory Visit: Payer: Self-pay

## 2022-06-03 ENCOUNTER — Emergency Department (HOSPITAL_COMMUNITY)
Admission: EM | Admit: 2022-06-03 | Discharge: 2022-06-04 | Disposition: A | Discharge status: Skilled Nursing Facility | Payer: Medicare HMO | Attending: Emergency Medicine | Admitting: Emergency Medicine

## 2022-06-03 DIAGNOSIS — S0990XA Unspecified injury of head, initial encounter: Secondary | ICD-10-CM

## 2022-06-03 DIAGNOSIS — S0101XA Laceration without foreign body of scalp, initial encounter: Secondary | ICD-10-CM

## 2022-06-03 DIAGNOSIS — W19XXXA Unspecified fall, initial encounter: Secondary | ICD-10-CM

## 2022-06-03 DIAGNOSIS — W01198A Fall on same level from slipping, tripping and stumbling with subsequent striking against other object, initial encounter: Secondary | ICD-10-CM | POA: Diagnosis not present

## 2022-06-03 DIAGNOSIS — S199XXA Unspecified injury of neck, initial encounter: Secondary | ICD-10-CM | POA: Diagnosis not present

## 2022-06-03 DIAGNOSIS — Z79899 Other long term (current) drug therapy: Secondary | ICD-10-CM | POA: Diagnosis not present

## 2022-06-03 DIAGNOSIS — D181 Lymphangioma, any site: Secondary | ICD-10-CM | POA: Diagnosis not present

## 2022-06-03 DIAGNOSIS — S065X0A Traumatic subdural hemorrhage without loss of consciousness, initial encounter: Secondary | ICD-10-CM | POA: Diagnosis not present

## 2022-06-03 MED ORDER — LIDOCAINE-EPINEPHRINE (PF) 2 %-1:200000 IJ SOLN
10.0000 mL | Freq: Once | INTRAMUSCULAR | Status: DC
  Filled 2022-06-03: qty 20

## 2022-06-03 NOTE — ED Notes (Signed)
Patient transported to CT 

## 2022-06-03 NOTE — ED Notes (Signed)
This Rn called [facility] and spoke with [place] who reports understanding of report.

## 2022-06-03 NOTE — ED Triage Notes (Signed)
Per EMS patient lives at [facility]. Per EMS patient had a witnessed fall from standing. Per EMS no LOC, no thinners. About a 6cm lac to the back of the head and bleeding is conrolled

## 2022-06-03 NOTE — Discharge Instructions (Addendum)
Return for any problem.  Staples placed today in your scalp will need to be removed in approximately 7 days.

## 2022-06-03 NOTE — ED Provider Notes (Signed)
[facility] EMERGENCY DEPARTMENT Provider Note   CSN: [id number] Arrival date & time: 06/03/22  2058     History  Chief Complaint  Patient presents with   Brandon Vargas is a 66 y.o. male.  66 year old male presents after witnessed fall at [facility].  He fell backwards and struck his head.  Patient is not on blood thinners.  Patient with reported laceration to the posterior scalp.  Upon arrival to the ED he is otherwise without complaint.  Patient with frequent falls per report.  Tetanus is up to date.  The history is provided by the patient and medical records.  Fall This is a new problem. The current episode started less than 1 hour ago. The problem occurs every several days. The problem has not changed since onset.Pertinent negatives include no chest pain, no abdominal pain and no headaches. Nothing aggravates the symptoms. Nothing relieves the symptoms.       Home Medications Prior to Admission medications   Medication Sig Start Date End Date Taking? Authorizing Provider  ACCU-CHEK GUIDE test strip  05/15/20   [provider]  acetaminophen (TYLENOL) 500 MG tablet Take 500 mg by mouth every 6 (six) hours as needed for mild pain.    [provider]  Cholecalciferol (VITAMIN D3) 25 MCG (1000 UT) CAPS Take 1 capsule (1,000 Units total) by mouth daily. 12/03/21   [place], Blondell Reveal, MD  cyclobenzaprine (FLEXERIL) 5 MG tablet Take 1 tablet (5 mg total) by mouth at bedtime as needed for muscle spasms. Patient taking differently: Take 5 mg by mouth daily as needed for muscle spasms. 02/01/22   [place], Blondell Reveal, MD  diclofenac Sodium (VOLTAREN) 1 % GEL Apply 2 g topically 4 (four) times daily. To affected joint. Patient not taking: Reported on 05/14/2022 05/05/22   [place], Blondell Reveal, MD  donepezil (ARICEPT) 10 MG tablet Take 1 tablet (10 mg total) by mouth daily. 12/06/21   [place], Blondell Reveal, MD  Menthol, Topical Analgesic,  (BIOFREEZE EX) Apply 1 application. topically daily as needed (For neck pain).    [provider]  metFORMIN (GLUCOPHAGE) 500 MG tablet Take 1 tablet (500 mg total) by mouth daily with breakfast for 14 days, THEN 1 tablet (500 mg total) 2 (two) times daily with a meal. 05/18/22 08/16/22  Mercy Riding, MD  mirtazapine (REMERON) 7.5 MG tablet Take 7.5 mg by mouth at bedtime. 09/02/21   [provider]  pantoprazole (PROTONIX) 40 MG tablet Take 1 tablet (40 mg total) by mouth daily. 02/22/22   [place], Blondell Reveal, MD  tamsulosin (FLOMAX) 0.4 MG CAPS capsule Take 0.4 mg by mouth daily.    [provider]      Allergies    Patient has no known allergies.    Review of Systems   Review of Systems  Cardiovascular:  Negative for chest pain.  Gastrointestinal:  Negative for abdominal pain.  Neurological:  Negative for headaches.  All other systems reviewed and are negative.   Physical Exam Updated Vital Signs BP (!) 160/90   Pulse 88   Temp 98.8 F (37.1 C) (Oral)   Resp (!) 22   SpO2 100%  Physical Exam Vitals and nursing note reviewed.  Constitutional:      General: He is not in acute distress.    Appearance: Normal appearance. He is well-developed.  HENT:     Head: Normocephalic.     Comments:  Laceration to posterior scalp Eyes:     Conjunctiva/sclera: Conjunctivae normal.     Pupils: Pupils are equal, round, and reactive to light.  Cardiovascular:     Rate and Rhythm: Normal rate and regular rhythm.     Heart sounds: Normal heart sounds.  Pulmonary:     Effort: Pulmonary effort is normal. No respiratory distress.     Breath sounds: Normal breath sounds.  Abdominal:     General: There is no distension.     Palpations: Abdomen is soft.     Tenderness: There is no abdominal tenderness.  Musculoskeletal:        General: No deformity. Normal range of motion.     Cervical back: Normal range of motion and neck supple.  Skin:    General: Skin is warm and  dry.  Neurological:     General: No focal deficit present.     Mental Status: He is alert and oriented to person, place, and time.     ED Results / Procedures / Treatments   Labs (all labs ordered are listed, but only abnormal results are displayed) Labs Reviewed - No data to display  EKG None  Radiology No results found.  Procedures .Marland KitchenLaceration Repair  Date/Time: 06/03/2022 10:03 PM  Performed by: Valarie Merino, MD Authorized by: Valarie Merino, MD   Consent:    Consent obtained:  Verbal   Consent given by:  Patient   Risks, benefits, and alternatives were discussed: yes     Risks discussed:  Infection, need for additional repair, nerve damage, poor wound healing, poor cosmetic result, retained foreign body, tendon damage, vascular damage and pain Universal protocol:    Immediately prior to procedure, a time out was called: yes     Patient identity confirmed:  Verbally with patient Anesthesia:    Anesthesia method:  None Laceration details:    Location:  Scalp   Scalp location:  Occipital   Length (cm):  6 Pre-procedure details:    Preparation:  Patient was prepped and draped in usual sterile fashion and imaging obtained to evaluate for foreign bodies Exploration:    Limited defect created (wound extended): no     Hemostasis achieved with:  Direct pressure   Imaging outcome: foreign body not noted     Wound exploration: wound explored through full range of motion     Contaminated: no   Treatment:    Area cleansed with:  Saline   Amount of cleaning:  Standard   Irrigation method:  Syringe   Visualized foreign bodies/material removed: no     Debridement:  None   Undermining:  None Skin repair:    Repair method:  Staples   Number of staples:  9 Repair type:    Repair type:  Simple Post-procedure details:    Dressing:  Open (no dressing)   Procedure completion:  Tolerated     Medications Ordered in ED Medications  lidocaine-EPINEPHrine (XYLOCAINE  W/EPI) 2 %-1:200000 (PF) injection 10 mL (has no administration in time range)    ED Course/ Medical Decision Making/ A&P                           Medical Decision Making Amount and/or Complexity of Data Reviewed Radiology: ordered.  Risk Prescription drug management.    Medical Screen Complete  This patient presented to the ED with complaint of scalp laceration, fall.  This complaint involves an extensive number of treatment options. The  initial differential diagnosis includes, but is not limited to, traumatic injury related to fall  This presentation is: Acute, Self-Limited, Previously Undiagnosed, Uncertain Prognosis, Complicated, Systemic Symptoms, and Threat to Life/Bodily Function  Patient presents for evaluation after fall from standing.  Patient with witnessed fall at his facility.  Patient with laceration to the posterior scalp.  Patient's tetanus is up-to-date  Scalp laceration repaired without difficulty.  CT imaging are concerning for possible subdural collections.  Imaging reviewed with neurosurgery Dr. Annette Stable.  Patient does not require acute neurosurgical intervention or repeat imaging study per Dr. Annette Stable.  Patient okay for discharge.  Importance of close follow-up is stressed.  Strict return precautions given and understood. Additional history obtained:  External records from outside sources obtained and reviewed including prior ED visits and prior Inpatient records.    Imaging Studies ordered:  I ordered imaging studies including ET head, CT C-spine I independently visualized and interpreted obtained imaging which showed chronic subdural collections I agree with the radiologist interpretation.   Cardiac Monitoring:  The patient was maintained on a cardiac monitor.  I personally viewed and interpreted the cardiac monitor which showed an underlying rhythm of: NSR  Problem List / ED Course:  Closed head injury, scalp  laceration   Reevaluation:  After the interventions noted above, I reevaluated the patient and found that they have: improved  Disposition:  After consideration of the diagnostic results and the patients response to treatment, I feel that the patent would benefit from close outpatient follow-up.          Final Clinical Impression(s) / ED Diagnoses Final diagnoses:  Injury of head, initial encounter  Fall, initial encounter  Laceration of scalp, initial encounter    Rx / DC Orders ED Discharge Orders     None         Valarie Merino, MD 06/03/22 2239

## 2022-06-06 DIAGNOSIS — W19XXXA Unspecified fall, initial encounter: Secondary | ICD-10-CM | POA: Diagnosis not present

## 2022-06-06 DIAGNOSIS — M6281 Muscle weakness (generalized): Secondary | ICD-10-CM | POA: Diagnosis not present

## 2022-06-06 DIAGNOSIS — S0091XA Abrasion of unspecified part of head, initial encounter: Secondary | ICD-10-CM | POA: Diagnosis not present

## 2022-06-07 ENCOUNTER — Ambulatory Visit (HOSPITAL_BASED_OUTPATIENT_CLINIC_OR_DEPARTMENT_OTHER): Payer: Medicare HMO | Admitting: Family Medicine

## 2022-06-08 DIAGNOSIS — W19XXXA Unspecified fall, initial encounter: Secondary | ICD-10-CM | POA: Diagnosis not present

## 2022-06-08 DIAGNOSIS — S0091XA Abrasion of unspecified part of head, initial encounter: Secondary | ICD-10-CM | POA: Diagnosis not present

## 2022-06-08 DIAGNOSIS — M6281 Muscle weakness (generalized): Secondary | ICD-10-CM | POA: Diagnosis not present

## 2022-06-14 NOTE — Progress Notes (Signed)
Patient presented for nurse visit for labs

## 2022-06-15 DIAGNOSIS — E1140 Type 2 diabetes mellitus with diabetic neuropathy, unspecified: Secondary | ICD-10-CM | POA: Diagnosis not present

## 2022-06-17 DIAGNOSIS — M6281 Muscle weakness (generalized): Secondary | ICD-10-CM | POA: Diagnosis not present

## 2022-06-17 DIAGNOSIS — W19XXXA Unspecified fall, initial encounter: Secondary | ICD-10-CM | POA: Diagnosis not present

## 2022-06-20 ENCOUNTER — Encounter (HOSPITAL_BASED_OUTPATIENT_CLINIC_OR_DEPARTMENT_OTHER): Payer: Self-pay

## 2022-06-22 DIAGNOSIS — R1312 Dysphagia, oropharyngeal phase: Secondary | ICD-10-CM | POA: Diagnosis not present

## 2022-06-22 DIAGNOSIS — R2689 Other abnormalities of gait and mobility: Secondary | ICD-10-CM | POA: Diagnosis not present

## 2022-06-22 DIAGNOSIS — R41841 Cognitive communication deficit: Secondary | ICD-10-CM | POA: Diagnosis not present

## 2022-06-22 DIAGNOSIS — M6281 Muscle weakness (generalized): Secondary | ICD-10-CM | POA: Diagnosis not present

## 2022-06-22 DIAGNOSIS — I69828 Other speech and language deficits following other cerebrovascular disease: Secondary | ICD-10-CM | POA: Diagnosis not present

## 2022-06-22 DIAGNOSIS — I69891 Dysphagia following other cerebrovascular disease: Secondary | ICD-10-CM | POA: Diagnosis not present

## 2022-06-22 DIAGNOSIS — G231 Progressive supranuclear ophthalmoplegia [Steele-Richardson-Olszewski]: Secondary | ICD-10-CM | POA: Diagnosis not present

## 2022-06-23 DIAGNOSIS — M6281 Muscle weakness (generalized): Secondary | ICD-10-CM | POA: Diagnosis not present

## 2022-06-23 DIAGNOSIS — I69828 Other speech and language deficits following other cerebrovascular disease: Secondary | ICD-10-CM | POA: Diagnosis not present

## 2022-06-23 DIAGNOSIS — R1312 Dysphagia, oropharyngeal phase: Secondary | ICD-10-CM | POA: Diagnosis not present

## 2022-06-23 DIAGNOSIS — R41841 Cognitive communication deficit: Secondary | ICD-10-CM | POA: Diagnosis not present

## 2022-06-23 DIAGNOSIS — R2689 Other abnormalities of gait and mobility: Secondary | ICD-10-CM | POA: Diagnosis not present

## 2022-06-23 DIAGNOSIS — G231 Progressive supranuclear ophthalmoplegia [Steele-Richardson-Olszewski]: Secondary | ICD-10-CM | POA: Diagnosis not present

## 2022-06-23 DIAGNOSIS — I69891 Dysphagia following other cerebrovascular disease: Secondary | ICD-10-CM | POA: Diagnosis not present

## 2022-06-24 DIAGNOSIS — M6281 Muscle weakness (generalized): Secondary | ICD-10-CM | POA: Diagnosis not present

## 2022-06-24 DIAGNOSIS — I69828 Other speech and language deficits following other cerebrovascular disease: Secondary | ICD-10-CM | POA: Diagnosis not present

## 2022-06-24 DIAGNOSIS — G231 Progressive supranuclear ophthalmoplegia [Steele-Richardson-Olszewski]: Secondary | ICD-10-CM | POA: Diagnosis not present

## 2022-06-24 DIAGNOSIS — R41841 Cognitive communication deficit: Secondary | ICD-10-CM | POA: Diagnosis not present

## 2022-06-24 DIAGNOSIS — R1312 Dysphagia, oropharyngeal phase: Secondary | ICD-10-CM | POA: Diagnosis not present

## 2022-06-24 DIAGNOSIS — E1140 Type 2 diabetes mellitus with diabetic neuropathy, unspecified: Secondary | ICD-10-CM | POA: Diagnosis not present

## 2022-06-24 DIAGNOSIS — R2689 Other abnormalities of gait and mobility: Secondary | ICD-10-CM | POA: Diagnosis not present

## 2022-06-24 DIAGNOSIS — I69891 Dysphagia following other cerebrovascular disease: Secondary | ICD-10-CM | POA: Diagnosis not present

## 2022-06-24 DIAGNOSIS — N400 Enlarged prostate without lower urinary tract symptoms: Secondary | ICD-10-CM | POA: Diagnosis not present

## 2022-06-24 DIAGNOSIS — K219 Gastro-esophageal reflux disease without esophagitis: Secondary | ICD-10-CM | POA: Diagnosis not present

## 2022-06-25 DIAGNOSIS — G231 Progressive supranuclear ophthalmoplegia [Steele-Richardson-Olszewski]: Secondary | ICD-10-CM | POA: Diagnosis not present

## 2022-06-25 DIAGNOSIS — I69828 Other speech and language deficits following other cerebrovascular disease: Secondary | ICD-10-CM | POA: Diagnosis not present

## 2022-06-25 DIAGNOSIS — R1312 Dysphagia, oropharyngeal phase: Secondary | ICD-10-CM | POA: Diagnosis not present

## 2022-06-25 DIAGNOSIS — M6281 Muscle weakness (generalized): Secondary | ICD-10-CM | POA: Diagnosis not present

## 2022-06-25 DIAGNOSIS — I69891 Dysphagia following other cerebrovascular disease: Secondary | ICD-10-CM | POA: Diagnosis not present

## 2022-06-25 DIAGNOSIS — R41841 Cognitive communication deficit: Secondary | ICD-10-CM | POA: Diagnosis not present

## 2022-06-25 DIAGNOSIS — R2689 Other abnormalities of gait and mobility: Secondary | ICD-10-CM | POA: Diagnosis not present

## 2022-06-26 DIAGNOSIS — R2689 Other abnormalities of gait and mobility: Secondary | ICD-10-CM | POA: Diagnosis not present

## 2022-06-26 DIAGNOSIS — G231 Progressive supranuclear ophthalmoplegia [Steele-Richardson-Olszewski]: Secondary | ICD-10-CM | POA: Diagnosis not present

## 2022-06-26 DIAGNOSIS — I69891 Dysphagia following other cerebrovascular disease: Secondary | ICD-10-CM | POA: Diagnosis not present

## 2022-06-26 DIAGNOSIS — I69828 Other speech and language deficits following other cerebrovascular disease: Secondary | ICD-10-CM | POA: Diagnosis not present

## 2022-06-26 DIAGNOSIS — R41841 Cognitive communication deficit: Secondary | ICD-10-CM | POA: Diagnosis not present

## 2022-06-26 DIAGNOSIS — R1312 Dysphagia, oropharyngeal phase: Secondary | ICD-10-CM | POA: Diagnosis not present

## 2022-06-26 DIAGNOSIS — M6281 Muscle weakness (generalized): Secondary | ICD-10-CM | POA: Diagnosis not present

## 2022-06-27 DIAGNOSIS — M6281 Muscle weakness (generalized): Secondary | ICD-10-CM | POA: Diagnosis not present

## 2022-06-27 DIAGNOSIS — I69828 Other speech and language deficits following other cerebrovascular disease: Secondary | ICD-10-CM | POA: Diagnosis not present

## 2022-06-27 DIAGNOSIS — I69891 Dysphagia following other cerebrovascular disease: Secondary | ICD-10-CM | POA: Diagnosis not present

## 2022-06-27 DIAGNOSIS — G231 Progressive supranuclear ophthalmoplegia [Steele-Richardson-Olszewski]: Secondary | ICD-10-CM | POA: Diagnosis not present

## 2022-06-27 DIAGNOSIS — R2689 Other abnormalities of gait and mobility: Secondary | ICD-10-CM | POA: Diagnosis not present

## 2022-06-27 DIAGNOSIS — R1312 Dysphagia, oropharyngeal phase: Secondary | ICD-10-CM | POA: Diagnosis not present

## 2022-06-27 DIAGNOSIS — R41841 Cognitive communication deficit: Secondary | ICD-10-CM | POA: Diagnosis not present

## 2022-06-27 DIAGNOSIS — E119 Type 2 diabetes mellitus without complications: Secondary | ICD-10-CM | POA: Diagnosis not present

## 2022-06-28 DIAGNOSIS — I69891 Dysphagia following other cerebrovascular disease: Secondary | ICD-10-CM | POA: Diagnosis not present

## 2022-06-28 DIAGNOSIS — R2689 Other abnormalities of gait and mobility: Secondary | ICD-10-CM | POA: Diagnosis not present

## 2022-06-28 DIAGNOSIS — R1312 Dysphagia, oropharyngeal phase: Secondary | ICD-10-CM | POA: Diagnosis not present

## 2022-06-28 DIAGNOSIS — M6281 Muscle weakness (generalized): Secondary | ICD-10-CM | POA: Diagnosis not present

## 2022-06-28 DIAGNOSIS — G231 Progressive supranuclear ophthalmoplegia [Steele-Richardson-Olszewski]: Secondary | ICD-10-CM | POA: Diagnosis not present

## 2022-06-28 DIAGNOSIS — I69828 Other speech and language deficits following other cerebrovascular disease: Secondary | ICD-10-CM | POA: Diagnosis not present

## 2022-06-28 DIAGNOSIS — R41841 Cognitive communication deficit: Secondary | ICD-10-CM | POA: Diagnosis not present

## 2022-06-29 DIAGNOSIS — I69891 Dysphagia following other cerebrovascular disease: Secondary | ICD-10-CM | POA: Diagnosis not present

## 2022-06-29 DIAGNOSIS — R2689 Other abnormalities of gait and mobility: Secondary | ICD-10-CM | POA: Diagnosis not present

## 2022-06-29 DIAGNOSIS — G231 Progressive supranuclear ophthalmoplegia [Steele-Richardson-Olszewski]: Secondary | ICD-10-CM | POA: Diagnosis not present

## 2022-06-29 DIAGNOSIS — I69828 Other speech and language deficits following other cerebrovascular disease: Secondary | ICD-10-CM | POA: Diagnosis not present

## 2022-06-29 DIAGNOSIS — M6281 Muscle weakness (generalized): Secondary | ICD-10-CM | POA: Diagnosis not present

## 2022-06-29 DIAGNOSIS — R1312 Dysphagia, oropharyngeal phase: Secondary | ICD-10-CM | POA: Diagnosis not present

## 2022-06-29 DIAGNOSIS — W19XXXA Unspecified fall, initial encounter: Secondary | ICD-10-CM | POA: Diagnosis not present

## 2022-06-29 DIAGNOSIS — R41841 Cognitive communication deficit: Secondary | ICD-10-CM | POA: Diagnosis not present

## 2022-06-30 DIAGNOSIS — R41841 Cognitive communication deficit: Secondary | ICD-10-CM | POA: Diagnosis not present

## 2022-06-30 DIAGNOSIS — Z9181 History of falling: Secondary | ICD-10-CM | POA: Diagnosis not present

## 2022-06-30 DIAGNOSIS — M6281 Muscle weakness (generalized): Secondary | ICD-10-CM | POA: Diagnosis not present

## 2022-06-30 DIAGNOSIS — F0390 Unspecified dementia without behavioral disturbance: Secondary | ICD-10-CM | POA: Diagnosis not present

## 2022-06-30 DIAGNOSIS — R2689 Other abnormalities of gait and mobility: Secondary | ICD-10-CM | POA: Diagnosis not present

## 2022-06-30 DIAGNOSIS — G231 Progressive supranuclear ophthalmoplegia [Steele-Richardson-Olszewski]: Secondary | ICD-10-CM | POA: Diagnosis not present

## 2022-06-30 DIAGNOSIS — R1312 Dysphagia, oropharyngeal phase: Secondary | ICD-10-CM | POA: Diagnosis not present

## 2022-06-30 DIAGNOSIS — I69828 Other speech and language deficits following other cerebrovascular disease: Secondary | ICD-10-CM | POA: Diagnosis not present

## 2022-06-30 DIAGNOSIS — I69891 Dysphagia following other cerebrovascular disease: Secondary | ICD-10-CM | POA: Diagnosis not present

## 2022-07-01 DIAGNOSIS — R1312 Dysphagia, oropharyngeal phase: Secondary | ICD-10-CM | POA: Diagnosis not present

## 2022-07-01 DIAGNOSIS — I69828 Other speech and language deficits following other cerebrovascular disease: Secondary | ICD-10-CM | POA: Diagnosis not present

## 2022-07-01 DIAGNOSIS — R2689 Other abnormalities of gait and mobility: Secondary | ICD-10-CM | POA: Diagnosis not present

## 2022-07-01 DIAGNOSIS — I69891 Dysphagia following other cerebrovascular disease: Secondary | ICD-10-CM | POA: Diagnosis not present

## 2022-07-01 DIAGNOSIS — R41841 Cognitive communication deficit: Secondary | ICD-10-CM | POA: Diagnosis not present

## 2022-07-01 DIAGNOSIS — G231 Progressive supranuclear ophthalmoplegia [Steele-Richardson-Olszewski]: Secondary | ICD-10-CM | POA: Diagnosis not present

## 2022-07-01 DIAGNOSIS — M6281 Muscle weakness (generalized): Secondary | ICD-10-CM | POA: Diagnosis not present

## 2022-07-02 DIAGNOSIS — I69891 Dysphagia following other cerebrovascular disease: Secondary | ICD-10-CM | POA: Diagnosis not present

## 2022-07-02 DIAGNOSIS — R2689 Other abnormalities of gait and mobility: Secondary | ICD-10-CM | POA: Diagnosis not present

## 2022-07-02 DIAGNOSIS — I69828 Other speech and language deficits following other cerebrovascular disease: Secondary | ICD-10-CM | POA: Diagnosis not present

## 2022-07-02 DIAGNOSIS — M6281 Muscle weakness (generalized): Secondary | ICD-10-CM | POA: Diagnosis not present

## 2022-07-02 DIAGNOSIS — G231 Progressive supranuclear ophthalmoplegia [Steele-Richardson-Olszewski]: Secondary | ICD-10-CM | POA: Diagnosis not present

## 2022-07-02 DIAGNOSIS — R1312 Dysphagia, oropharyngeal phase: Secondary | ICD-10-CM | POA: Diagnosis not present

## 2022-07-02 DIAGNOSIS — R41841 Cognitive communication deficit: Secondary | ICD-10-CM | POA: Diagnosis not present

## 2022-07-03 DIAGNOSIS — I69891 Dysphagia following other cerebrovascular disease: Secondary | ICD-10-CM | POA: Diagnosis not present

## 2022-07-03 DIAGNOSIS — M6281 Muscle weakness (generalized): Secondary | ICD-10-CM | POA: Diagnosis not present

## 2022-07-03 DIAGNOSIS — R1312 Dysphagia, oropharyngeal phase: Secondary | ICD-10-CM | POA: Diagnosis not present

## 2022-07-03 DIAGNOSIS — R2689 Other abnormalities of gait and mobility: Secondary | ICD-10-CM | POA: Diagnosis not present

## 2022-07-03 DIAGNOSIS — R41841 Cognitive communication deficit: Secondary | ICD-10-CM | POA: Diagnosis not present

## 2022-07-03 DIAGNOSIS — I69828 Other speech and language deficits following other cerebrovascular disease: Secondary | ICD-10-CM | POA: Diagnosis not present

## 2022-07-03 DIAGNOSIS — G231 Progressive supranuclear ophthalmoplegia [Steele-Richardson-Olszewski]: Secondary | ICD-10-CM | POA: Diagnosis not present

## 2022-07-04 DIAGNOSIS — R2689 Other abnormalities of gait and mobility: Secondary | ICD-10-CM | POA: Diagnosis not present

## 2022-07-04 DIAGNOSIS — G231 Progressive supranuclear ophthalmoplegia [Steele-Richardson-Olszewski]: Secondary | ICD-10-CM | POA: Diagnosis not present

## 2022-07-04 DIAGNOSIS — I69828 Other speech and language deficits following other cerebrovascular disease: Secondary | ICD-10-CM | POA: Diagnosis not present

## 2022-07-04 DIAGNOSIS — R7303 Prediabetes: Secondary | ICD-10-CM | POA: Diagnosis not present

## 2022-07-04 DIAGNOSIS — R41841 Cognitive communication deficit: Secondary | ICD-10-CM | POA: Diagnosis not present

## 2022-07-04 DIAGNOSIS — R1312 Dysphagia, oropharyngeal phase: Secondary | ICD-10-CM | POA: Diagnosis not present

## 2022-07-04 DIAGNOSIS — W19XXXA Unspecified fall, initial encounter: Secondary | ICD-10-CM | POA: Diagnosis not present

## 2022-07-04 DIAGNOSIS — I69891 Dysphagia following other cerebrovascular disease: Secondary | ICD-10-CM | POA: Diagnosis not present

## 2022-07-04 DIAGNOSIS — M6281 Muscle weakness (generalized): Secondary | ICD-10-CM | POA: Diagnosis not present

## 2022-07-05 DIAGNOSIS — R1312 Dysphagia, oropharyngeal phase: Secondary | ICD-10-CM | POA: Diagnosis not present

## 2022-07-05 DIAGNOSIS — I69891 Dysphagia following other cerebrovascular disease: Secondary | ICD-10-CM | POA: Diagnosis not present

## 2022-07-05 DIAGNOSIS — R2689 Other abnormalities of gait and mobility: Secondary | ICD-10-CM | POA: Diagnosis not present

## 2022-07-05 DIAGNOSIS — R41841 Cognitive communication deficit: Secondary | ICD-10-CM | POA: Diagnosis not present

## 2022-07-05 DIAGNOSIS — M6281 Muscle weakness (generalized): Secondary | ICD-10-CM | POA: Diagnosis not present

## 2022-07-05 DIAGNOSIS — I69828 Other speech and language deficits following other cerebrovascular disease: Secondary | ICD-10-CM | POA: Diagnosis not present

## 2022-07-05 DIAGNOSIS — G231 Progressive supranuclear ophthalmoplegia [Steele-Richardson-Olszewski]: Secondary | ICD-10-CM | POA: Diagnosis not present

## 2022-07-06 DIAGNOSIS — I69828 Other speech and language deficits following other cerebrovascular disease: Secondary | ICD-10-CM | POA: Diagnosis not present

## 2022-07-06 DIAGNOSIS — Z9181 History of falling: Secondary | ICD-10-CM | POA: Diagnosis not present

## 2022-07-06 DIAGNOSIS — R41841 Cognitive communication deficit: Secondary | ICD-10-CM | POA: Diagnosis not present

## 2022-07-06 DIAGNOSIS — R1312 Dysphagia, oropharyngeal phase: Secondary | ICD-10-CM | POA: Diagnosis not present

## 2022-07-06 DIAGNOSIS — R2689 Other abnormalities of gait and mobility: Secondary | ICD-10-CM | POA: Diagnosis not present

## 2022-07-06 DIAGNOSIS — I69891 Dysphagia following other cerebrovascular disease: Secondary | ICD-10-CM | POA: Diagnosis not present

## 2022-07-06 DIAGNOSIS — M6281 Muscle weakness (generalized): Secondary | ICD-10-CM | POA: Diagnosis not present

## 2022-07-06 DIAGNOSIS — G231 Progressive supranuclear ophthalmoplegia [Steele-Richardson-Olszewski]: Secondary | ICD-10-CM | POA: Diagnosis not present

## 2022-07-07 DIAGNOSIS — I69828 Other speech and language deficits following other cerebrovascular disease: Secondary | ICD-10-CM | POA: Diagnosis not present

## 2022-07-07 DIAGNOSIS — R41841 Cognitive communication deficit: Secondary | ICD-10-CM | POA: Diagnosis not present

## 2022-07-07 DIAGNOSIS — I69891 Dysphagia following other cerebrovascular disease: Secondary | ICD-10-CM | POA: Diagnosis not present

## 2022-07-07 DIAGNOSIS — M6281 Muscle weakness (generalized): Secondary | ICD-10-CM | POA: Diagnosis not present

## 2022-07-07 DIAGNOSIS — R2689 Other abnormalities of gait and mobility: Secondary | ICD-10-CM | POA: Diagnosis not present

## 2022-07-07 DIAGNOSIS — G231 Progressive supranuclear ophthalmoplegia [Steele-Richardson-Olszewski]: Secondary | ICD-10-CM | POA: Diagnosis not present

## 2022-07-07 DIAGNOSIS — R1312 Dysphagia, oropharyngeal phase: Secondary | ICD-10-CM | POA: Diagnosis not present

## 2022-07-08 DIAGNOSIS — M6281 Muscle weakness (generalized): Secondary | ICD-10-CM | POA: Diagnosis not present

## 2022-07-08 DIAGNOSIS — R2689 Other abnormalities of gait and mobility: Secondary | ICD-10-CM | POA: Diagnosis not present

## 2022-07-08 DIAGNOSIS — I69828 Other speech and language deficits following other cerebrovascular disease: Secondary | ICD-10-CM | POA: Diagnosis not present

## 2022-07-08 DIAGNOSIS — R1312 Dysphagia, oropharyngeal phase: Secondary | ICD-10-CM | POA: Diagnosis not present

## 2022-07-08 DIAGNOSIS — R41841 Cognitive communication deficit: Secondary | ICD-10-CM | POA: Diagnosis not present

## 2022-07-08 DIAGNOSIS — G231 Progressive supranuclear ophthalmoplegia [Steele-Richardson-Olszewski]: Secondary | ICD-10-CM | POA: Diagnosis not present

## 2022-07-08 DIAGNOSIS — I69891 Dysphagia following other cerebrovascular disease: Secondary | ICD-10-CM | POA: Diagnosis not present

## 2022-07-08 DIAGNOSIS — Z9181 History of falling: Secondary | ICD-10-CM | POA: Diagnosis not present

## 2022-07-09 DIAGNOSIS — R2689 Other abnormalities of gait and mobility: Secondary | ICD-10-CM | POA: Diagnosis not present

## 2022-07-09 DIAGNOSIS — R41841 Cognitive communication deficit: Secondary | ICD-10-CM | POA: Diagnosis not present

## 2022-07-09 DIAGNOSIS — I69828 Other speech and language deficits following other cerebrovascular disease: Secondary | ICD-10-CM | POA: Diagnosis not present

## 2022-07-09 DIAGNOSIS — M6281 Muscle weakness (generalized): Secondary | ICD-10-CM | POA: Diagnosis not present

## 2022-07-09 DIAGNOSIS — G231 Progressive supranuclear ophthalmoplegia [Steele-Richardson-Olszewski]: Secondary | ICD-10-CM | POA: Diagnosis not present

## 2022-07-09 DIAGNOSIS — I69891 Dysphagia following other cerebrovascular disease: Secondary | ICD-10-CM | POA: Diagnosis not present

## 2022-07-09 DIAGNOSIS — R1312 Dysphagia, oropharyngeal phase: Secondary | ICD-10-CM | POA: Diagnosis not present

## 2022-07-10 DIAGNOSIS — M6281 Muscle weakness (generalized): Secondary | ICD-10-CM | POA: Diagnosis not present

## 2022-07-10 DIAGNOSIS — I69828 Other speech and language deficits following other cerebrovascular disease: Secondary | ICD-10-CM | POA: Diagnosis not present

## 2022-07-10 DIAGNOSIS — G231 Progressive supranuclear ophthalmoplegia [Steele-Richardson-Olszewski]: Secondary | ICD-10-CM | POA: Diagnosis not present

## 2022-07-10 DIAGNOSIS — R2689 Other abnormalities of gait and mobility: Secondary | ICD-10-CM | POA: Diagnosis not present

## 2022-07-10 DIAGNOSIS — R1312 Dysphagia, oropharyngeal phase: Secondary | ICD-10-CM | POA: Diagnosis not present

## 2022-07-10 DIAGNOSIS — I69891 Dysphagia following other cerebrovascular disease: Secondary | ICD-10-CM | POA: Diagnosis not present

## 2022-07-10 DIAGNOSIS — R41841 Cognitive communication deficit: Secondary | ICD-10-CM | POA: Diagnosis not present

## 2022-07-11 DIAGNOSIS — R2689 Other abnormalities of gait and mobility: Secondary | ICD-10-CM | POA: Diagnosis not present

## 2022-07-11 DIAGNOSIS — M6281 Muscle weakness (generalized): Secondary | ICD-10-CM | POA: Diagnosis not present

## 2022-07-11 DIAGNOSIS — I69828 Other speech and language deficits following other cerebrovascular disease: Secondary | ICD-10-CM | POA: Diagnosis not present

## 2022-07-11 DIAGNOSIS — G231 Progressive supranuclear ophthalmoplegia [Steele-Richardson-Olszewski]: Secondary | ICD-10-CM | POA: Diagnosis not present

## 2022-07-11 DIAGNOSIS — R1312 Dysphagia, oropharyngeal phase: Secondary | ICD-10-CM | POA: Diagnosis not present

## 2022-07-11 DIAGNOSIS — Z9181 History of falling: Secondary | ICD-10-CM | POA: Diagnosis not present

## 2022-07-11 DIAGNOSIS — R41841 Cognitive communication deficit: Secondary | ICD-10-CM | POA: Diagnosis not present

## 2022-07-11 DIAGNOSIS — I69891 Dysphagia following other cerebrovascular disease: Secondary | ICD-10-CM | POA: Diagnosis not present

## 2022-07-12 DIAGNOSIS — R1312 Dysphagia, oropharyngeal phase: Secondary | ICD-10-CM | POA: Diagnosis not present

## 2022-07-12 DIAGNOSIS — G231 Progressive supranuclear ophthalmoplegia [Steele-Richardson-Olszewski]: Secondary | ICD-10-CM | POA: Diagnosis not present

## 2022-07-12 DIAGNOSIS — M6281 Muscle weakness (generalized): Secondary | ICD-10-CM | POA: Diagnosis not present

## 2022-07-12 DIAGNOSIS — I69891 Dysphagia following other cerebrovascular disease: Secondary | ICD-10-CM | POA: Diagnosis not present

## 2022-07-12 DIAGNOSIS — I69828 Other speech and language deficits following other cerebrovascular disease: Secondary | ICD-10-CM | POA: Diagnosis not present

## 2022-07-12 DIAGNOSIS — R41841 Cognitive communication deficit: Secondary | ICD-10-CM | POA: Diagnosis not present

## 2022-07-12 DIAGNOSIS — R2689 Other abnormalities of gait and mobility: Secondary | ICD-10-CM | POA: Diagnosis not present

## 2022-07-13 DIAGNOSIS — I69828 Other speech and language deficits following other cerebrovascular disease: Secondary | ICD-10-CM | POA: Diagnosis not present

## 2022-07-13 DIAGNOSIS — G231 Progressive supranuclear ophthalmoplegia [Steele-Richardson-Olszewski]: Secondary | ICD-10-CM | POA: Diagnosis not present

## 2022-07-13 DIAGNOSIS — I69891 Dysphagia following other cerebrovascular disease: Secondary | ICD-10-CM | POA: Diagnosis not present

## 2022-07-13 DIAGNOSIS — R2689 Other abnormalities of gait and mobility: Secondary | ICD-10-CM | POA: Diagnosis not present

## 2022-07-13 DIAGNOSIS — R41841 Cognitive communication deficit: Secondary | ICD-10-CM | POA: Diagnosis not present

## 2022-07-13 DIAGNOSIS — R1312 Dysphagia, oropharyngeal phase: Secondary | ICD-10-CM | POA: Diagnosis not present

## 2022-07-13 DIAGNOSIS — M6281 Muscle weakness (generalized): Secondary | ICD-10-CM | POA: Diagnosis not present

## 2022-07-14 DIAGNOSIS — M6281 Muscle weakness (generalized): Secondary | ICD-10-CM | POA: Diagnosis not present

## 2022-07-14 DIAGNOSIS — R2689 Other abnormalities of gait and mobility: Secondary | ICD-10-CM | POA: Diagnosis not present

## 2022-07-14 DIAGNOSIS — I69828 Other speech and language deficits following other cerebrovascular disease: Secondary | ICD-10-CM | POA: Diagnosis not present

## 2022-07-14 DIAGNOSIS — I69891 Dysphagia following other cerebrovascular disease: Secondary | ICD-10-CM | POA: Diagnosis not present

## 2022-07-14 DIAGNOSIS — R41841 Cognitive communication deficit: Secondary | ICD-10-CM | POA: Diagnosis not present

## 2022-07-14 DIAGNOSIS — G231 Progressive supranuclear ophthalmoplegia [Steele-Richardson-Olszewski]: Secondary | ICD-10-CM | POA: Diagnosis not present

## 2022-07-14 DIAGNOSIS — R1312 Dysphagia, oropharyngeal phase: Secondary | ICD-10-CM | POA: Diagnosis not present

## 2022-07-15 DIAGNOSIS — E46 Unspecified protein-calorie malnutrition: Secondary | ICD-10-CM | POA: Diagnosis not present

## 2022-07-18 ENCOUNTER — Emergency Department (HOSPITAL_COMMUNITY): Payer: Medicare HMO

## 2022-07-18 ENCOUNTER — Encounter (HOSPITAL_COMMUNITY): Payer: Self-pay | Admitting: Emergency Medicine

## 2022-07-18 ENCOUNTER — Emergency Department (HOSPITAL_COMMUNITY)
Admission: EM | Admit: 2022-07-18 | Discharge: 2022-07-19 | Disposition: A | Discharge status: Skilled Nursing Facility | Payer: Medicare HMO | Attending: Emergency Medicine | Admitting: Emergency Medicine

## 2022-07-18 ENCOUNTER — Other Ambulatory Visit: Payer: Self-pay

## 2022-07-18 DIAGNOSIS — M6281 Muscle weakness (generalized): Secondary | ICD-10-CM | POA: Diagnosis not present

## 2022-07-18 DIAGNOSIS — E46 Unspecified protein-calorie malnutrition: Secondary | ICD-10-CM | POA: Diagnosis not present

## 2022-07-18 DIAGNOSIS — G319 Degenerative disease of nervous system, unspecified: Secondary | ICD-10-CM | POA: Diagnosis not present

## 2022-07-18 DIAGNOSIS — Z7984 Long term (current) use of oral hypoglycemic drugs: Secondary | ICD-10-CM | POA: Diagnosis not present

## 2022-07-18 DIAGNOSIS — S065X0A Traumatic subdural hemorrhage without loss of consciousness, initial encounter: Secondary | ICD-10-CM | POA: Diagnosis not present

## 2022-07-18 DIAGNOSIS — W01198A Fall on same level from slipping, tripping and stumbling with subsequent striking against other object, initial encounter: Secondary | ICD-10-CM | POA: Diagnosis not present

## 2022-07-18 DIAGNOSIS — M1612 Unilateral primary osteoarthritis, left hip: Secondary | ICD-10-CM | POA: Diagnosis not present

## 2022-07-18 DIAGNOSIS — Z79899 Other long term (current) drug therapy: Secondary | ICD-10-CM | POA: Diagnosis not present

## 2022-07-18 DIAGNOSIS — S0990XA Unspecified injury of head, initial encounter: Secondary | ICD-10-CM

## 2022-07-18 DIAGNOSIS — E119 Type 2 diabetes mellitus without complications: Secondary | ICD-10-CM | POA: Diagnosis not present

## 2022-07-18 DIAGNOSIS — S199XXA Unspecified injury of neck, initial encounter: Secondary | ICD-10-CM | POA: Diagnosis not present

## 2022-07-18 DIAGNOSIS — J320 Chronic maxillary sinusitis: Secondary | ICD-10-CM | POA: Diagnosis not present

## 2022-07-18 DIAGNOSIS — F0390 Unspecified dementia without behavioral disturbance: Secondary | ICD-10-CM | POA: Diagnosis not present

## 2022-07-18 DIAGNOSIS — W19XXXA Unspecified fall, initial encounter: Secondary | ICD-10-CM

## 2022-07-18 DIAGNOSIS — I6203 Nontraumatic chronic subdural hemorrhage: Secondary | ICD-10-CM | POA: Diagnosis not present

## 2022-07-18 DIAGNOSIS — M47812 Spondylosis without myelopathy or radiculopathy, cervical region: Secondary | ICD-10-CM | POA: Diagnosis not present

## 2022-07-18 DIAGNOSIS — S0081XA Abrasion of other part of head, initial encounter: Secondary | ICD-10-CM | POA: Insufficient documentation

## 2022-07-18 DIAGNOSIS — I6529 Occlusion and stenosis of unspecified carotid artery: Secondary | ICD-10-CM | POA: Diagnosis not present

## 2022-07-18 DIAGNOSIS — R58 Hemorrhage, not elsewhere classified: Secondary | ICD-10-CM | POA: Diagnosis not present

## 2022-07-18 DIAGNOSIS — M542 Cervicalgia: Secondary | ICD-10-CM | POA: Diagnosis not present

## 2022-07-18 DIAGNOSIS — Z043 Encounter for examination and observation following other accident: Secondary | ICD-10-CM | POA: Diagnosis not present

## 2022-07-18 DIAGNOSIS — Z9181 History of falling: Secondary | ICD-10-CM | POA: Diagnosis not present

## 2022-07-18 MED ORDER — QUETIAPINE FUMARATE 25 MG PO TABS
25.0000 mg | ORAL_TABLET | Freq: Every day | ORAL | Status: DC
  Administered 2022-07-18: 25 mg via ORAL
  Filled 2022-07-18: qty 1

## 2022-07-18 MED ORDER — LORAZEPAM 2 MG/ML IJ SOLN
1.0000 mg | Freq: Once | INTRAMUSCULAR | Status: AC
  Administered 2022-07-18: 1 mg via INTRAMUSCULAR
  Filled 2022-07-18: qty 1

## 2022-07-18 MED ORDER — MIRTAZAPINE 15 MG PO TABS
7.5000 mg | ORAL_TABLET | Freq: Every day | ORAL | Status: DC
  Administered 2022-07-18: 7.5 mg via ORAL
  Filled 2022-07-18: qty 1

## 2022-07-18 MED ORDER — ACETAMINOPHEN 500 MG PO TABS
500.0000 mg | ORAL_TABLET | Freq: Four times a day (QID) | ORAL | Status: DC | PRN
  Administered 2022-07-18: 500 mg via ORAL
  Filled 2022-07-18: qty 1

## 2022-07-18 NOTE — ED Provider Notes (Signed)
[facility] EMERGENCY DEPARTMENT Provider Note   CSN: [id number] Arrival date & time: 07/18/22  1655     History PMH: Dementia, Frequent falls, GERD, Depression, Diabetes, Anxiety Chief Complaint  Patient presents with  . Fall    Brandon Vargas is a 66 y.o. male. Presents after unwitnessed fall at his nursing facility at approximately 12:30pm.  Per patient he was leaning against a chair when it gave out and he fell forward.  He denies being dizzy or lightheaded prior to the fall. He says that he hit the front of his head.  He did not lose consciousness.  He denies any visual disturbance, numbness, weakness, facial droop, speech changes, nausea, vomiting, otorrhea, rhinorrhea, or abnormal mental status.  He denies neck pain or other arthralgias.   Per facility, he is at his baseline mentation. He is not on blood thinners.   Fall Associated symptoms include headaches.       Home Medications Prior to Admission medications   Medication Sig Start Date End Date Taking? Authorizing Provider  ACCU-CHEK GUIDE test strip  05/15/20   [provider]  acetaminophen (TYLENOL) 500 MG tablet Take 500 mg by mouth every 6 (six) hours as needed for mild pain.    [provider]  Cholecalciferol (VITAMIN D3) 25 MCG (1000 UT) CAPS Take 1 capsule (1,000 Units total) by mouth daily. 12/03/21   [place], Blondell Reveal, MD  cyclobenzaprine (FLEXERIL) 5 MG tablet Take 1 tablet (5 mg total) by mouth at bedtime as needed for muscle spasms. Patient taking differently: Take 5 mg by mouth daily as needed for muscle spasms. 02/01/22   [place], Blondell Reveal, MD  diclofenac Sodium (VOLTAREN) 1 % GEL Apply 2 g topically 4 (four) times daily. To affected joint. Patient not taking: Reported on 05/14/2022 05/05/22   [place], Blondell Reveal, MD  donepezil (ARICEPT) 10 MG tablet Take 1 tablet (10 mg total) by mouth daily. 12/06/21   [place], Blondell Reveal, MD  Menthol, Topical Analgesic, (BIOFREEZE EX)  Apply 1 application. topically daily as needed (For neck pain).    [provider]  metFORMIN (GLUCOPHAGE) 500 MG tablet Take 1 tablet (500 mg total) by mouth daily with breakfast for 14 days, THEN 1 tablet (500 mg total) 2 (two) times daily with a meal. 05/18/22 08/16/22  Mercy Riding, MD  mirtazapine (REMERON) 7.5 MG tablet Take 7.5 mg by mouth at bedtime. 09/02/21   [provider]  pantoprazole (PROTONIX) 40 MG tablet Take 1 tablet (40 mg total) by mouth daily. 02/22/22   [place], Blondell Reveal, MD  tamsulosin (FLOMAX) 0.4 MG CAPS capsule Take 0.4 mg by mouth daily.    [provider]      Allergies    Metformin and related    Review of Systems   Review of Systems  Musculoskeletal:  Negative for arthralgias and neck pain.  Neurological:  Positive for headaches. Negative for dizziness, tremors, seizures, syncope, facial asymmetry, weakness, light-headedness and numbness.  Psychiatric/Behavioral:  Negative for confusion.   All other systems reviewed and are negative.   Physical Exam Updated Vital Signs BP 125/75   Pulse (!) 57   Temp 98.3 F (36.8 C) (Oral)   Resp 12   SpO2 99%  Physical Exam Vitals and nursing note reviewed.  Constitutional:      General: He is not in acute distress.    Appearance: Normal appearance. He is well-developed. He is not ill-appearing, toxic-appearing or diaphoretic.  HENT:     Head: Normocephalic.     Comments: Abrasion to superior frontal skull. No laceration. Bleeding controlled    Ears:     Comments: No battle sign, ottorhea, hemotympanum    Nose: No nasal deformity or rhinorrhea.     Mouth/Throat:     Lips: Pink. No lesions.     Mouth: Mucous membranes are moist.     Pharynx: Oropharynx is clear. No oropharyngeal exudate or posterior oropharyngeal erythema.  Eyes:     General: Gaze aligned appropriately. No scleral icterus.       Right eye: No discharge.        Left eye: No discharge.     Conjunctiva/sclera:  Conjunctivae normal.     Right eye: Right conjunctiva is not injected. No exudate or hemorrhage.    Left eye: Left conjunctiva is not injected. No exudate or hemorrhage.    Pupils: Pupils are equal, round, and reactive to light.  Cardiovascular:     Rate and Rhythm: Normal rate and regular rhythm.     Pulses: Normal pulses.     Heart sounds: Normal heart sounds. No murmur heard.    No friction rub. No gallop.  Pulmonary:     Effort: Pulmonary effort is normal. No respiratory distress.     Breath sounds: Normal breath sounds. No stridor. No wheezing, rhonchi or rales.  Chest:     Chest wall: No tenderness.  Abdominal:     General: Abdomen is flat. There is no distension.     Palpations: Abdomen is soft.     Tenderness: There is no abdominal tenderness. There is no right CVA tenderness, left CVA tenderness, guarding or rebound.  Musculoskeletal:     Right lower leg: No edema.     Left lower leg: No edema.  Skin:    General: Skin is warm and dry.     Comments: Abrasion to forehead  Neurological:     Mental Status: He is alert and oriented to person, place, and time. Mental status is at baseline.     Comments: Baseline dementia  Alert and Oriented x 3 (seems to have cognitive issues which is baseline) Speech clear with no aphasia Cranial Nerve testing - PERRLA. Not cooperative with EOM testing - Facial Sensation grossly intact - No facial asymmetry - Uvula and Tongue Midline - Accessory Muscles intact Motor: - 5/5 motor strength in all four extremities.  Sensation: - Grossly intact in all four extremities.      Psychiatric:        Mood and Affect: Mood normal.        Speech: Speech normal.        Behavior: Behavior normal. Behavior is cooperative.     Comments: Trying to crawl out of bed. Does not seem to understand the fall risk.      ED Results / Procedures / Treatments   Labs (all labs ordered are listed, but only abnormal results are displayed) Labs Reviewed - No  data to display  EKG None  Radiology No results found.  Procedures Procedures  This patient was on telemetry or cardiac monitoring during their time in the ED.   Medications Ordered in ED Medications - No data to display  ED Course/ Medical Decision Making/ A&P                            Medical Decision Making Amount and/or Complexity of Data Reviewed Radiology: ordered.  Risk OTC drugs. Prescription drug management.    MDM  This is a 66 y.o. male who presents to the ED with fall  Initial Impression  Vitals Stable, Chronically Ill appearing Seems to have dementia. Trying to crawl out of bed. We have redirected him. He is at baseline per facility. Patient has frequent falls and this is well documented. No indication that a medical reason caused his fall. Neuro exam not focal which is reassuring. Abrasion on forehead CT head and C spine order placed.  I personally ordered, reviewed, and interpreted all laboratory work and imaging and agree with radiologist interpretation. Results interpreted [place]: CT head: interval increase in size of bilateral subdural hematomas measuring up to 1.2 cm on right and 1 cm on left. Likely acute on chronic. No midline shift CT C spine without acute abnormalities, degenerative changes and stenosis are noted.  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 Annette Stable from Neurosurgery. SDH only looks minimally worse. Recommends repeat CT head in 6 hours. If worse, repage neurosurgery. If normal, he can be discharged back to facility. CT is ordered for 0400. Put in home medications as well as small dose of seroquel for sundowning and constantly crawling out of bed. @2154 , Patient had a witnessed fall where he tried to get out of bed and slid backwards. He did not hit is head. Landed on left hip which was noted to have prior bruise. Hip XR ordered @2232 , Hip XR negative for  fracture.     Charting Requirements Additional history is obtained from:  Independent historian and EMS External Records from outside source obtained and reviewed including: prior head ct Social Determinants of Health:  none Pertinant PMH that complicates patient's illness: frequent falls, dementia  Patient Care Problems that were addressed during this visit: - Fall: Acute illness with complication - Subdural Hematoma: Chronic illness with exacerbation, progression, or side effects of treatment This patient was maintained on a cardiac monitor/telemetry. I personally viewed and interpreted the cardiac monitor which reveals an underlying rhythm of NSR Medications given in ED: Ativan, Seroquel, Remoron Reevaluation of the patient after these medicines showed that the patient improved I have reviewed home medications and made changes accordingly.  Critical Care Interventions: n/a Consultations: Neurosurgery Disposition: ***  This is a supervised visit with my attending physician, Dr. Rogene Houston. We have discussed this patient and they have altered the plan as needed.  Portions of this note were generated with [employer]. Dictation errors may occur despite best attempts at proofreading.     Final Clinical Impression(s) / ED Diagnoses Final diagnoses:  None    Rx / DC Orders ED Discharge Orders     None

## 2022-07-18 NOTE — ED Notes (Signed)
Pt transported to CT ?

## 2022-07-18 NOTE — ED Triage Notes (Signed)
Pt BIB [facility] from [facility] for fall. 1230 today pt had a fall, was leaning on a table and tripped and fell. Denies LOC or weakness/dizziness prior to fall. Unwitnessed fall, hx of dementia. Hit head, bandaged by facility. Hx neck surgery for pinched nerves at C2/C3. EMS did not apply C-collar as pt was guarding neck and leaning it forward and experienced more pain when leaning his head back.  120/74, 62, 98, CBG 223

## 2022-07-18 NOTE — ED Notes (Signed)
This RN looked up, pt standing in door and threw himself on the floor. Did not hit head, landed on L hip, previous bruise noted. Bed alarm ordered for the second time. Shirlee Limerick, [place] notified - hip XR ordered.

## 2022-07-19 ENCOUNTER — Emergency Department (HOSPITAL_COMMUNITY): Payer: Medicare HMO

## 2022-07-19 DIAGNOSIS — J320 Chronic maxillary sinusitis: Secondary | ICD-10-CM | POA: Diagnosis not present

## 2022-07-19 DIAGNOSIS — I6529 Occlusion and stenosis of unspecified carotid artery: Secondary | ICD-10-CM | POA: Diagnosis not present

## 2022-07-19 DIAGNOSIS — Z7401 Bed confinement status: Secondary | ICD-10-CM | POA: Diagnosis not present

## 2022-07-19 DIAGNOSIS — R4182 Altered mental status, unspecified: Secondary | ICD-10-CM | POA: Diagnosis not present

## 2022-07-19 DIAGNOSIS — W19XXXA Unspecified fall, initial encounter: Secondary | ICD-10-CM | POA: Diagnosis not present

## 2022-07-19 DIAGNOSIS — G319 Degenerative disease of nervous system, unspecified: Secondary | ICD-10-CM | POA: Diagnosis not present

## 2022-07-19 DIAGNOSIS — S065X0A Traumatic subdural hemorrhage without loss of consciousness, initial encounter: Secondary | ICD-10-CM | POA: Diagnosis not present

## 2022-07-19 NOTE — Discharge Instructions (Addendum)
There is acute on chronic subdural on CT scan.  This was monitored over 6+ hours, repeat CT without acute change. Monitor for any acute mental status changes. Follow-up with primary care. Return here for new concerns.

## 2022-07-19 NOTE — ED Notes (Signed)
Pt verbalizes understanding of discharge instructions. Opportunity for questions and answers were provided. Pt discharged from the ED to [facility] via [place].

## 2022-07-19 NOTE — ED Provider Notes (Signed)
Assumed care in sign out.  See prior notes for full H&P.  Briefly, 66 y.o. M here s/p fall at [facility] with + head trauma.  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  Discussed with neurosurgery, Dr. Annette Stable-- recommended repeat head CT in 6 hours, can discharge back to facility if no acute changes.  Plan:  repeat head CT pending at 0400.  Results for orders placed or performed during the hospital encounter of 05/14/22  Urinalysis, Routine w reflex microscopic  Result Value Ref Range   Color, Urine YELLOW YELLOW   APPearance CLEAR CLEAR   Specific Gravity, Urine 1.019 1.005 - 1.030   pH 7.0 5.0 - 8.0   Glucose, UA NEGATIVE NEGATIVE mg/dL   Hgb urine dipstick NEGATIVE NEGATIVE   Bilirubin Urine NEGATIVE NEGATIVE   Ketones, ur NEGATIVE NEGATIVE mg/dL   Protein, ur TRACE (A) NEGATIVE mg/dL   Nitrite NEGATIVE NEGATIVE   Leukocytes,Ua NEGATIVE NEGATIVE  CBC with Differential  Result Value Ref Range   WBC 10.7 (H) 4.0 - 10.5 K/uL   RBC 4.40 4.22 - 5.81 MIL/uL   Hemoglobin 11.9 (L) 13.0 - 17.0 g/dL   HCT 36.8 (L) 39.0 - 52.0 %   MCV 83.6 80.0 - 100.0 fL   MCH 27.0 26.0 - 34.0 pg   MCHC 32.3 30.0 - 36.0 g/dL   RDW 13.7 11.5 - 15.5 %   Platelets 297 150 - 400 K/uL   nRBC 0.0 0.0 - 0.2 %   Neutrophils Relative % 83 %   Neutro Abs 8.9 (H) 1.7 - 7.7 K/uL   Lymphocytes Relative 10 %   Lymphs Abs 1.1 0.7 - 4.0 K/uL   Monocytes Relative 6 %   Monocytes Absolute 0.6 0.1 - 1.0 K/uL   Eosinophils Relative 0 %   Eosinophils Absolute 0.0 0.0 - 0.5 K/uL   Basophils Relative 0 %   Basophils Absolute 0.0 0.0 - 0.1 K/uL   Immature Granulocytes 1 %   Abs Immature Granulocytes 0.05 0.00 - 0.07 K/uL  Comprehensive metabolic panel  Result Value Ref Range   Sodium 141 135 - 145 mmol/L   Potassium 4.1 3.5 - 5.1 mmol/L   Chloride 105 98 - 111 mmol/L   CO2 27 22 - 32 mmol/L   Glucose, Bld 105 (H) 70 - 99 mg/dL   BUN 18 8 - 23 mg/dL   Creatinine, Ser  0.88 0.61 - 1.24 mg/dL   Calcium 8.7 (L) 8.9 - 10.3 mg/dL   Total Protein 6.5 6.5 - 8.1 g/dL   Albumin 3.7 3.5 - 5.0 g/dL   AST 21 15 - 41 U/L   ALT 22 0 - 44 U/L   Alkaline Phosphatase 69 38 - 126 U/L   Total Bilirubin 0.9 0.3 - 1.2 mg/dL   GFR, Estimated >60 >60 mL/min   Anion gap 9 5 - 15  Ferritin  Result Value Ref Range   Ferritin 147 24 - 336 ng/mL  Iron and TIBC  Result Value Ref Range   Iron 52 45 - 182 ug/dL   TIBC 317 250 - 450 ug/dL   Saturation Ratios 16 (L) 17.9 - 39.5 %   UIBC 265 ug/dL  Vitamin B12  Result Value Ref Range   Vitamin B-12 336 180 - 914 pg/mL  HIV Antibody (routine testing w rflx)  Result Value Ref Range   HIV Screen 4th Generation wRfx Non Reactive Non Reactive  TSH  Result Value  Ref Range   TSH 1.131 0.350 - 4.500 uIU/mL  Basic metabolic panel  Result Value Ref Range   Sodium 143 135 - 145 mmol/L   Potassium 4.3 3.5 - 5.1 mmol/L   Chloride 111 98 - 111 mmol/L   CO2 25 22 - 32 mmol/L   Glucose, Bld 109 (H) 70 - 99 mg/dL   BUN 13 8 - 23 mg/dL   Creatinine, Ser 0.84 0.61 - 1.24 mg/dL   Calcium 8.8 (L) 8.9 - 10.3 mg/dL   GFR, Estimated >60 >60 mL/min   Anion gap 7 5 - 15  CBC  Result Value Ref Range   WBC 7.6 4.0 - 10.5 K/uL   RBC 4.15 (L) 4.22 - 5.81 MIL/uL   Hemoglobin 11.4 (L) 13.0 - 17.0 g/dL   HCT 35.3 (L) 39.0 - 52.0 %   MCV 85.1 80.0 - 100.0 fL   MCH 27.5 26.0 - 34.0 pg   MCHC 32.3 30.0 - 36.0 g/dL   RDW 13.7 11.5 - 15.5 %   Platelets 273 150 - 400 K/uL   nRBC 0.0 0.0 - 0.2 %  Glucose, capillary  Result Value Ref Range   Glucose-Capillary 96 70 - 99 mg/dL  Glucose, capillary  Result Value Ref Range   Glucose-Capillary 95 70 - 99 mg/dL  Glucose, capillary  Result Value Ref Range   Glucose-Capillary 47 (L) 70 - 99 mg/dL  Glucose, capillary  Result Value Ref Range   Glucose-Capillary 63 (L) 70 - 99 mg/dL  Glucose, capillary  Result Value Ref Range   Glucose-Capillary 143 (H) 70 - 99 mg/dL  Glucose, capillary  Result  Value Ref Range   Glucose-Capillary 89 70 - 99 mg/dL  Glucose, capillary  Result Value Ref Range   Glucose-Capillary 103 (H) 70 - 99 mg/dL  Glucose, capillary  Result Value Ref Range   Glucose-Capillary 144 (H) 70 - 99 mg/dL  Glucose, capillary  Result Value Ref Range   Glucose-Capillary 93 70 - 99 mg/dL  Basic metabolic panel  Result Value Ref Range   Sodium 139 135 - 145 mmol/L   Potassium 4.2 3.5 - 5.1 mmol/L   Chloride 108 98 - 111 mmol/L   CO2 24 22 - 32 mmol/L   Glucose, Bld 162 (H) 70 - 99 mg/dL   BUN 16 8 - 23 mg/dL   Creatinine, Ser 0.97 0.61 - 1.24 mg/dL   Calcium 8.6 (L) 8.9 - 10.3 mg/dL   GFR, Estimated >60 >60 mL/min   Anion gap 7 5 - 15  CBC  Result Value Ref Range   WBC 7.9 4.0 - 10.5 K/uL   RBC 4.25 4.22 - 5.81 MIL/uL   Hemoglobin 12.1 (L) 13.0 - 17.0 g/dL   HCT 36.2 (L) 39.0 - 52.0 %   MCV 85.2 80.0 - 100.0 fL   MCH 28.5 26.0 - 34.0 pg   MCHC 33.4 30.0 - 36.0 g/dL   RDW 13.9 11.5 - 15.5 %   Platelets 281 150 - 400 K/uL   nRBC 0.0 0.0 - 0.2 %  Glucose, capillary  Result Value Ref Range   Glucose-Capillary 85 70 - 99 mg/dL  Glucose, capillary  Result Value Ref Range   Glucose-Capillary 136 (H) 70 - 99 mg/dL  Glucose, capillary  Result Value Ref Range   Glucose-Capillary 139 (H) 70 - 99 mg/dL  Glucose, capillary  Result Value Ref Range   Glucose-Capillary 164 (H) 70 - 99 mg/dL  Glucose, capillary  Result Value Ref Range  Glucose-Capillary 110 (H) 70 - 99 mg/dL  Glucose, capillary  Result Value Ref Range   Glucose-Capillary 125 (H) 70 - 99 mg/dL  Glucose, capillary  Result Value Ref Range   Glucose-Capillary 167 (H) 70 - 99 mg/dL  Glucose, capillary  Result Value Ref Range   Glucose-Capillary 142 (H) 70 - 99 mg/dL  Glucose, capillary  Result Value Ref Range   Glucose-Capillary 113 (H) 70 - 99 mg/dL  Glucose, capillary  Result Value Ref Range   Glucose-Capillary 176 (H) 70 - 99 mg/dL  Glucose, capillary  Result Value Ref Range    Glucose-Capillary 134 (H) 70 - 99 mg/dL  Glucose, capillary  Result Value Ref Range   Glucose-Capillary 147 (H) 70 - 99 mg/dL  Glucose, capillary  Result Value Ref Range   Glucose-Capillary 100 (H) 70 - 99 mg/dL  Glucose, capillary  Result Value Ref Range   Glucose-Capillary 258 (H) 70 - 99 mg/dL  Glucose, capillary  Result Value Ref Range   Glucose-Capillary 114 (H) 70 - 99 mg/dL  Glucose, capillary  Result Value Ref Range   Glucose-Capillary 160 (H) 70 - 99 mg/dL  CBG monitoring, ED  Result Value Ref Range   Glucose-Capillary 74 70 - 99 mg/dL   CT Head Wo Contrast  Result Date: 07/19/2022 CLINICAL DATA:  Head trauma with bilateral subdural hematomas. EXAM: CT HEAD WITHOUT CONTRAST TECHNIQUE: Contiguous axial images were obtained from the base of the skull through the vertex without intravenous contrast. RADIATION DOSE REDUCTION: This exam was performed according to the departmental dose-optimization program which includes automated exposure control, adjustment of the mA and/or kV according to patient size and/or use of iterative reconstruction technique. COMPARISON:  Head CT yesterday at 8:57 p.m. FINDINGS: Comment: Current study images were completed at 4:19 a.m., 07/19/2022. Brain: Bilateral mixed attenuation frontoparietal convexity subdural hematomas are again noted, likely acute on chronic, up to 1.3 cm diameter on the right and up to 1 cm diameter on the left, stable since yesterday's exam. There has been no interval worsening. Mild underlying gyral crowding is seen but no downward mass effect is evident due to patient's pre-existing atrophy. There is mild atrophy, small-vessel disease and atrophic ventriculomegaly, without midline shift. No cortical based infarct is seen but there is more abundant streak artifact on the current exam due to right lateral decubitus head positioning limiting fine detail. There is no evidence of parenchymal hemorrhage or mass. Basal cisterns are clear.  Vascular: There are scattered calcifications in the carotid siphons. No hyperdense central vessels. Skull: No fracture or focal lesion is seen. Sinuses/Orbits: There is mild membrane disease in the maxillary sinuses; other sinuses and bilateral mastoid air cells are clear. Unremarkable orbital contents. Other: None. 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 Mild underlying gyral crowding but due to pre-existing atrophy no significant downward mass effect. Chronic changes. Electronically Signed   By: Telford Nab M.D.   On: 07/19/2022 05:31   DG Hip Unilat W or Wo Pelvis 2-3 Views Left  Result Date: 07/18/2022 CLINICAL DATA:  Fall EXAM: DG HIP (WITH OR WITHOUT PELVIS) 2-3V LEFT COMPARISON:  None Available. FINDINGS: No fracture or dislocation is seen. Mild degenerative changes of the bilateral hips. Visualized bony pelvis appears intact. IMPRESSION: Negative. Electronically Signed   By: Julian Hy M.D.   On: 07/18/2022 22:28   CT Head Wo Contrast  Result Date: 07/18/2022  CLINICAL DATA:  Head trauma, minor (Age >= 65y); Neck trauma (Age >= 65y). Unwitnessed fall EXAM: CT HEAD WITHOUT CONTRAST CT CERVICAL SPINE WITHOUT CONTRAST TECHNIQUE: Multidetector CT imaging of the head and cervical spine was performed following the standard protocol without intravenous contrast. Multiplanar CT image reconstructions of the cervical spine were also generated. RADIATION DOSE REDUCTION: This exam was performed according to the departmental dose-optimization program which includes automated exposure control, adjustment of the mA and/or kV according to patient size and/or use of iterative reconstruction technique. COMPARISON:  CT head 06/03/2022 FINDINGS: CT HEAD FINDINGS Brain: No evidence of large-territorial acute infarction. No parenchymal hemorrhage. No mass lesion. Interval  increase in size of bilateral, likely acute chronic, subdural hematomas along the calvarial convexities measuring up to 1.2 cm on the right and 1 cm on the left. No mass effect or midline shift. No hydrocephalus. Basilar cisterns are patent. Vascular: No hyperdense vessel. Skull: No acute fracture or focal lesion. Sinuses/Orbits: Paranasal sinuses and mastoid air cells are clear. The orbits are unremarkable. Other: None. CT CERVICAL SPINE FINDINGS Alignment: Reversal of normal cervical lordosis likely due to positioning and degenerative changes. 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 No severe osseous central canal stenosis. No acute fracture. No aggressive appearing focal osseous lesion or focal pathologic process. Soft tissues and spinal canal: No prevertebral fluid or swelling. No visible canal hematoma. Upper chest: Unremarkable. Other: None. IMPRESSION: 1. Interval increase in size of bilateral, likely acute on chronic, subdural hematomas measuring up to 1.2 cm on the right and 1 cm on the left. No associated midline shift. 2. No acute displaced fracture or traumatic listhesis of the cervical spine. 3. 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 These results were called by telephone at the time of interpretation on 07/18/2022 at 9:18 pm to provider PA GRACE LOEFFLER , who verbally acknowledged these results. Electronically Signed   By: Iven Finn M.D.   On: 07/18/2022 21:18   CT Cervical Spine Wo Contrast  Result Date: 07/18/2022 CLINICAL DATA:  Head trauma, minor (Age >= 65y); Neck trauma (Age >= 65y). Unwitnessed fall EXAM: CT HEAD WITHOUT CONTRAST CT CERVICAL SPINE WITHOUT CONTRAST TECHNIQUE: Multidetector CT imaging of the head and cervical spine was performed following the standard protocol without intravenous contrast.  Multiplanar CT image reconstructions of the cervical spine were also generated. RADIATION DOSE REDUCTION: This exam was performed according to the departmental dose-optimization program which includes automated exposure control, adjustment of the mA and/or kV according to patient size and/or use of iterative reconstruction technique. COMPARISON:  CT head 06/03/2022 FINDINGS: CT HEAD FINDINGS Brain: No evidence of large-territorial acute infarction. No parenchymal hemorrhage. No mass lesion. Interval increase in size of bilateral, likely acute chronic, subdural hematomas along the calvarial convexities measuring up to 1.2 cm on the right and 1 cm on the left. No mass effect or midline shift. No hydrocephalus. Basilar cisterns are patent. Vascular: No hyperdense vessel. Skull: No acute fracture or focal lesion. Sinuses/Orbits: Paranasal sinuses and mastoid air cells are clear. The orbits are unremarkable. Other: None. CT CERVICAL SPINE FINDINGS Alignment: Reversal of normal cervical lordosis likely due to positioning and degenerative changes. 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 No severe osseous central canal stenosis. No acute fracture. No aggressive appearing focal osseous lesion or focal pathologic process. Soft tissues  and spinal canal: No prevertebral fluid or swelling. No visible canal hematoma. Upper chest: Unremarkable. Other: None. IMPRESSION: 1. Interval increase in size of bilateral, likely acute on chronic, subdural hematomas measuring up to 1.2 cm on the right and 1 cm on the left. No associated midline shift. 2. No acute displaced fracture or traumatic listhesis of the cervical spine. 3. 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 These results were called by telephone at the time of interpretation on 07/18/2022 at 9:18 pm to  provider PA GRACE LOEFFLER , who verbally acknowledged these results. Electronically Signed   By: Iven Finn M.D.   On: 07/18/2022 21:18     5:43 AM Repeat head CT without significant change-- acute on chronic subdural measuring 1.3cm on right, 1cm on left.  Reviewed with attending, Dr. Roxanne Mins.  Patient sleeping on repeat assessment but arouses to voice, able to state name.  VSS.  Feel he is stable for discharge back to facility.  [facility] called for transport.   Larene Pickett, PA-C 31/[id number]    Delora Fuel, MD 37/10/62 [phone]

## 2022-07-19 NOTE — ED Notes (Signed)
Called [facility] to transport patient to [facility]

## 2022-07-20 DIAGNOSIS — S0181XD Laceration without foreign body of other part of head, subsequent encounter: Secondary | ICD-10-CM | POA: Diagnosis not present

## 2022-07-20 DIAGNOSIS — F03918 Unspecified dementia, unspecified severity, with other behavioral disturbance: Secondary | ICD-10-CM | POA: Diagnosis not present

## 2022-07-20 DIAGNOSIS — R2681 Unsteadiness on feet: Secondary | ICD-10-CM | POA: Diagnosis not present

## 2022-07-25 DIAGNOSIS — G808 Other cerebral palsy: Secondary | ICD-10-CM | POA: Diagnosis not present

## 2022-07-25 DIAGNOSIS — R2681 Unsteadiness on feet: Secondary | ICD-10-CM | POA: Diagnosis not present

## 2022-07-25 DIAGNOSIS — S0181XD Laceration without foreign body of other part of head, subsequent encounter: Secondary | ICD-10-CM | POA: Diagnosis not present

## 2022-07-27 DIAGNOSIS — S199XXA Unspecified injury of neck, initial encounter: Secondary | ICD-10-CM | POA: Diagnosis not present

## 2022-07-27 DIAGNOSIS — G319 Degenerative disease of nervous system, unspecified: Secondary | ICD-10-CM | POA: Diagnosis not present

## 2022-07-27 DIAGNOSIS — Y999 Unspecified external cause status: Secondary | ICD-10-CM | POA: Diagnosis not present

## 2022-07-27 DIAGNOSIS — R58 Hemorrhage, not elsewhere classified: Secondary | ICD-10-CM | POA: Diagnosis not present

## 2022-07-27 DIAGNOSIS — S0101XA Laceration without foreign body of scalp, initial encounter: Secondary | ICD-10-CM | POA: Diagnosis not present

## 2022-07-27 DIAGNOSIS — M5030 Other cervical disc degeneration, unspecified cervical region: Secondary | ICD-10-CM | POA: Diagnosis not present

## 2022-07-27 DIAGNOSIS — W01198A Fall on same level from slipping, tripping and stumbling with subsequent striking against other object, initial encounter: Secondary | ICD-10-CM | POA: Diagnosis not present

## 2022-07-27 DIAGNOSIS — S065X0A Traumatic subdural hemorrhage without loss of consciousness, initial encounter: Secondary | ICD-10-CM | POA: Diagnosis not present

## 2022-07-27 DIAGNOSIS — W19XXXA Unspecified fall, initial encounter: Secondary | ICD-10-CM | POA: Diagnosis not present

## 2022-07-28 DIAGNOSIS — M1711 Unilateral primary osteoarthritis, right knee: Secondary | ICD-10-CM | POA: Diagnosis not present

## 2022-07-28 DIAGNOSIS — R001 Bradycardia, unspecified: Secondary | ICD-10-CM | POA: Diagnosis not present

## 2022-07-28 DIAGNOSIS — M25511 Pain in right shoulder: Secondary | ICD-10-CM | POA: Diagnosis not present

## 2022-07-28 DIAGNOSIS — S065X9A Traumatic subdural hemorrhage with loss of consciousness of unspecified duration, initial encounter: Secondary | ICD-10-CM | POA: Diagnosis not present

## 2022-07-28 DIAGNOSIS — G8911 Acute pain due to trauma: Secondary | ICD-10-CM | POA: Diagnosis not present

## 2022-07-28 DIAGNOSIS — S065XAA Traumatic subdural hemorrhage with loss of consciousness status unknown, initial encounter: Secondary | ICD-10-CM | POA: Diagnosis not present

## 2022-07-28 DIAGNOSIS — S199XXA Unspecified injury of neck, initial encounter: Secondary | ICD-10-CM | POA: Diagnosis not present

## 2022-07-28 DIAGNOSIS — F0284 Dementia in other diseases classified elsewhere, unspecified severity, with anxiety: Secondary | ICD-10-CM | POA: Diagnosis not present

## 2022-07-28 DIAGNOSIS — S065X0A Traumatic subdural hemorrhage without loss of consciousness, initial encounter: Secondary | ICD-10-CM | POA: Diagnosis not present

## 2022-07-28 DIAGNOSIS — M5030 Other cervical disc degeneration, unspecified cervical region: Secondary | ICD-10-CM | POA: Diagnosis not present

## 2022-07-28 DIAGNOSIS — M769 Unspecified enthesopathy, lower limb, excluding foot: Secondary | ICD-10-CM | POA: Diagnosis not present

## 2022-07-28 DIAGNOSIS — S7011XA Contusion of right thigh, initial encounter: Secondary | ICD-10-CM | POA: Diagnosis not present

## 2022-07-28 DIAGNOSIS — W228XXA Striking against or struck by other objects, initial encounter: Secondary | ICD-10-CM | POA: Diagnosis not present

## 2022-07-28 DIAGNOSIS — S0101XA Laceration without foreign body of scalp, initial encounter: Secondary | ICD-10-CM | POA: Diagnosis not present

## 2022-07-28 DIAGNOSIS — E119 Type 2 diabetes mellitus without complications: Secondary | ICD-10-CM | POA: Diagnosis not present

## 2022-07-28 DIAGNOSIS — W01198A Fall on same level from slipping, tripping and stumbling with subsequent striking against other object, initial encounter: Secondary | ICD-10-CM | POA: Diagnosis not present

## 2022-07-28 DIAGNOSIS — S8001XA Contusion of right knee, initial encounter: Secondary | ICD-10-CM | POA: Diagnosis not present

## 2022-07-28 DIAGNOSIS — M47898 Other spondylosis, sacral and sacrococcygeal region: Secondary | ICD-10-CM | POA: Diagnosis not present

## 2022-07-28 DIAGNOSIS — G319 Degenerative disease of nervous system, unspecified: Secondary | ICD-10-CM | POA: Diagnosis not present

## 2022-07-28 DIAGNOSIS — Y999 Unspecified external cause status: Secondary | ICD-10-CM | POA: Diagnosis not present

## 2022-07-28 DIAGNOSIS — M160 Bilateral primary osteoarthritis of hip: Secondary | ICD-10-CM | POA: Diagnosis not present

## 2022-07-28 DIAGNOSIS — I6201 Nontraumatic acute subdural hemorrhage: Secondary | ICD-10-CM | POA: Diagnosis not present

## 2022-07-28 DIAGNOSIS — R4182 Altered mental status, unspecified: Secondary | ICD-10-CM | POA: Diagnosis not present

## 2022-07-28 DIAGNOSIS — M19071 Primary osteoarthritis, right ankle and foot: Secondary | ICD-10-CM | POA: Diagnosis not present

## 2022-07-29 DIAGNOSIS — S065XAA Traumatic subdural hemorrhage with loss of consciousness status unknown, initial encounter: Secondary | ICD-10-CM | POA: Diagnosis not present

## 2022-07-29 DIAGNOSIS — Z743 Need for continuous supervision: Secondary | ICD-10-CM | POA: Diagnosis not present

## 2022-07-29 DIAGNOSIS — R531 Weakness: Secondary | ICD-10-CM | POA: Diagnosis not present

## 2022-07-29 DIAGNOSIS — F918 Other conduct disorders: Secondary | ICD-10-CM | POA: Diagnosis not present

## 2022-07-29 DIAGNOSIS — S0101XA Laceration without foreign body of scalp, initial encounter: Secondary | ICD-10-CM | POA: Diagnosis not present

## 2022-07-30 DIAGNOSIS — E559 Vitamin D deficiency, unspecified: Secondary | ICD-10-CM | POA: Diagnosis not present

## 2022-07-30 DIAGNOSIS — E119 Type 2 diabetes mellitus without complications: Secondary | ICD-10-CM | POA: Diagnosis not present

## 2022-08-01 DIAGNOSIS — S065XAD Traumatic subdural hemorrhage with loss of consciousness status unknown, subsequent encounter: Secondary | ICD-10-CM | POA: Diagnosis not present

## 2022-08-01 DIAGNOSIS — E119 Type 2 diabetes mellitus without complications: Secondary | ICD-10-CM | POA: Diagnosis not present

## 2022-08-01 DIAGNOSIS — R2689 Other abnormalities of gait and mobility: Secondary | ICD-10-CM | POA: Diagnosis not present

## 2022-08-01 DIAGNOSIS — M6281 Muscle weakness (generalized): Secondary | ICD-10-CM | POA: Diagnosis not present

## 2022-08-01 DIAGNOSIS — R2681 Unsteadiness on feet: Secondary | ICD-10-CM | POA: Diagnosis not present

## 2022-08-02 DIAGNOSIS — F03918 Unspecified dementia, unspecified severity, with other behavioral disturbance: Secondary | ICD-10-CM | POA: Diagnosis not present

## 2022-08-02 DIAGNOSIS — M6281 Muscle weakness (generalized): Secondary | ICD-10-CM | POA: Diagnosis not present

## 2022-08-02 DIAGNOSIS — E119 Type 2 diabetes mellitus without complications: Secondary | ICD-10-CM | POA: Diagnosis not present

## 2022-08-02 DIAGNOSIS — E46 Unspecified protein-calorie malnutrition: Secondary | ICD-10-CM | POA: Diagnosis not present

## 2022-08-02 DIAGNOSIS — S065XAD Traumatic subdural hemorrhage with loss of consciousness status unknown, subsequent encounter: Secondary | ICD-10-CM | POA: Diagnosis not present

## 2022-08-02 DIAGNOSIS — G808 Other cerebral palsy: Secondary | ICD-10-CM | POA: Diagnosis not present

## 2022-08-02 DIAGNOSIS — R2681 Unsteadiness on feet: Secondary | ICD-10-CM | POA: Diagnosis not present

## 2022-08-02 DIAGNOSIS — R2689 Other abnormalities of gait and mobility: Secondary | ICD-10-CM | POA: Diagnosis not present

## 2022-08-03 DIAGNOSIS — R2681 Unsteadiness on feet: Secondary | ICD-10-CM | POA: Diagnosis not present

## 2022-08-03 DIAGNOSIS — R2689 Other abnormalities of gait and mobility: Secondary | ICD-10-CM | POA: Diagnosis not present

## 2022-08-03 DIAGNOSIS — E119 Type 2 diabetes mellitus without complications: Secondary | ICD-10-CM | POA: Diagnosis not present

## 2022-08-03 DIAGNOSIS — S065XAD Traumatic subdural hemorrhage with loss of consciousness status unknown, subsequent encounter: Secondary | ICD-10-CM | POA: Diagnosis not present

## 2022-08-03 DIAGNOSIS — M6281 Muscle weakness (generalized): Secondary | ICD-10-CM | POA: Diagnosis not present

## 2022-08-04 DIAGNOSIS — R2689 Other abnormalities of gait and mobility: Secondary | ICD-10-CM | POA: Diagnosis not present

## 2022-08-04 DIAGNOSIS — R2681 Unsteadiness on feet: Secondary | ICD-10-CM | POA: Diagnosis not present

## 2022-08-04 DIAGNOSIS — E119 Type 2 diabetes mellitus without complications: Secondary | ICD-10-CM | POA: Diagnosis not present

## 2022-08-04 DIAGNOSIS — M6281 Muscle weakness (generalized): Secondary | ICD-10-CM | POA: Diagnosis not present

## 2022-08-04 DIAGNOSIS — S065XAD Traumatic subdural hemorrhage with loss of consciousness status unknown, subsequent encounter: Secondary | ICD-10-CM | POA: Diagnosis not present

## 2022-08-05 DIAGNOSIS — R2681 Unsteadiness on feet: Secondary | ICD-10-CM | POA: Diagnosis not present

## 2022-08-05 DIAGNOSIS — R2689 Other abnormalities of gait and mobility: Secondary | ICD-10-CM | POA: Diagnosis not present

## 2022-08-05 DIAGNOSIS — S065XAD Traumatic subdural hemorrhage with loss of consciousness status unknown, subsequent encounter: Secondary | ICD-10-CM | POA: Diagnosis not present

## 2022-08-05 DIAGNOSIS — M6281 Muscle weakness (generalized): Secondary | ICD-10-CM | POA: Diagnosis not present

## 2022-08-05 DIAGNOSIS — E119 Type 2 diabetes mellitus without complications: Secondary | ICD-10-CM | POA: Diagnosis not present

## 2022-08-08 DIAGNOSIS — E43 Unspecified severe protein-calorie malnutrition: Secondary | ICD-10-CM | POA: Diagnosis not present

## 2022-08-08 DIAGNOSIS — R2681 Unsteadiness on feet: Secondary | ICD-10-CM | POA: Diagnosis not present

## 2022-08-08 DIAGNOSIS — E119 Type 2 diabetes mellitus without complications: Secondary | ICD-10-CM | POA: Diagnosis not present

## 2022-08-08 DIAGNOSIS — E559 Vitamin D deficiency, unspecified: Secondary | ICD-10-CM | POA: Diagnosis not present

## 2022-08-08 DIAGNOSIS — E1165 Type 2 diabetes mellitus with hyperglycemia: Secondary | ICD-10-CM | POA: Diagnosis not present

## 2022-08-08 DIAGNOSIS — M6281 Muscle weakness (generalized): Secondary | ICD-10-CM | POA: Diagnosis not present

## 2022-08-08 DIAGNOSIS — S065XAD Traumatic subdural hemorrhage with loss of consciousness status unknown, subsequent encounter: Secondary | ICD-10-CM | POA: Diagnosis not present

## 2022-08-08 DIAGNOSIS — R2689 Other abnormalities of gait and mobility: Secondary | ICD-10-CM | POA: Diagnosis not present

## 2022-08-08 DIAGNOSIS — D464 Refractory anemia, unspecified: Secondary | ICD-10-CM | POA: Diagnosis not present

## 2022-08-10 DIAGNOSIS — S065XAD Traumatic subdural hemorrhage with loss of consciousness status unknown, subsequent encounter: Secondary | ICD-10-CM | POA: Diagnosis not present

## 2022-08-10 DIAGNOSIS — Z9181 History of falling: Secondary | ICD-10-CM | POA: Diagnosis not present

## 2022-08-10 DIAGNOSIS — M6281 Muscle weakness (generalized): Secondary | ICD-10-CM | POA: Diagnosis not present

## 2022-08-10 IMAGING — CT CT HEAD W/O CM
3 series · 15 of 47 positions shown, 18 images · non-contrast
Comparison: Head CT dated 05/14/2022.

CLINICAL DATA: Trauma.



[Series 3: head 5.0 h30s · axial · 0.48mm/px · z∈[+150,+290]mm · 9 of 34 slices shown, 12 images]
[im 3/34  brain]
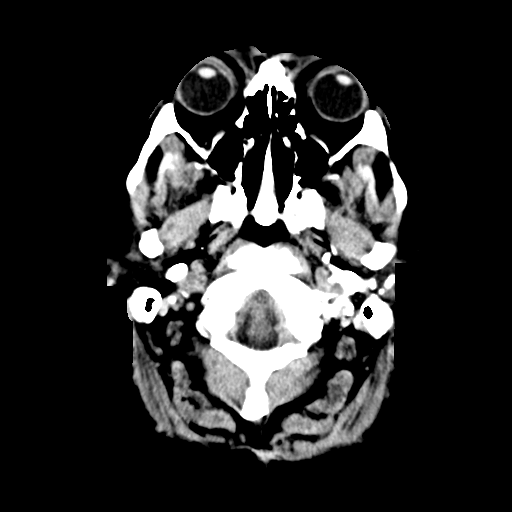
[im 3/34  bone]
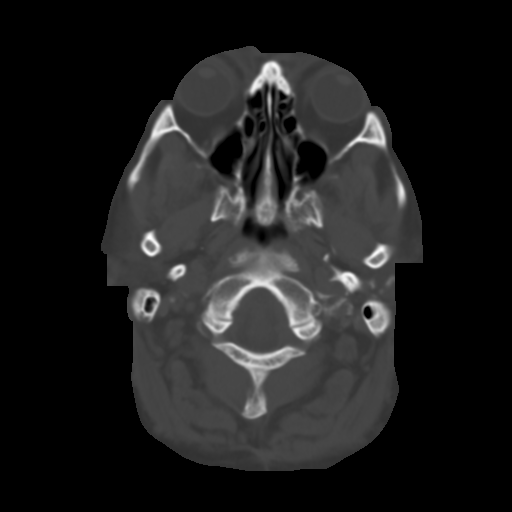
[im 6/34  brain]
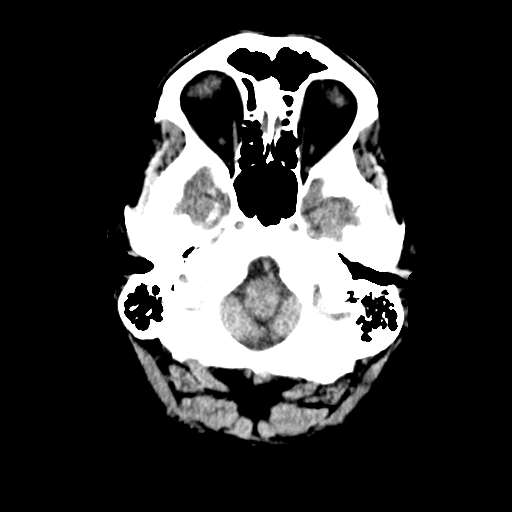
[im 10/34  brain]
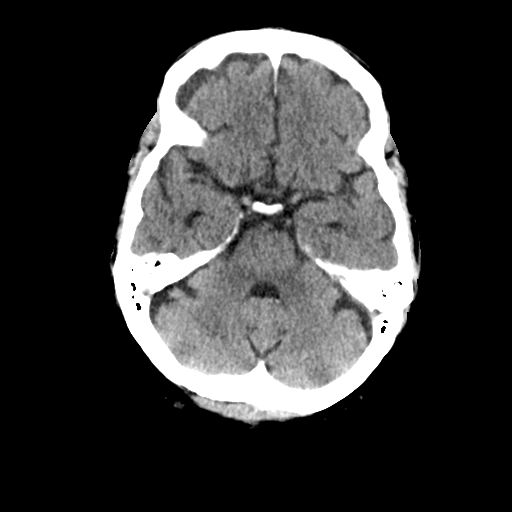
[im 13/34  brain]
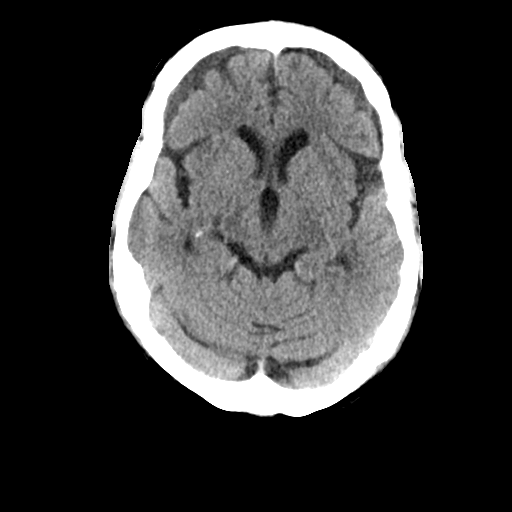
[im 18/34  brain]
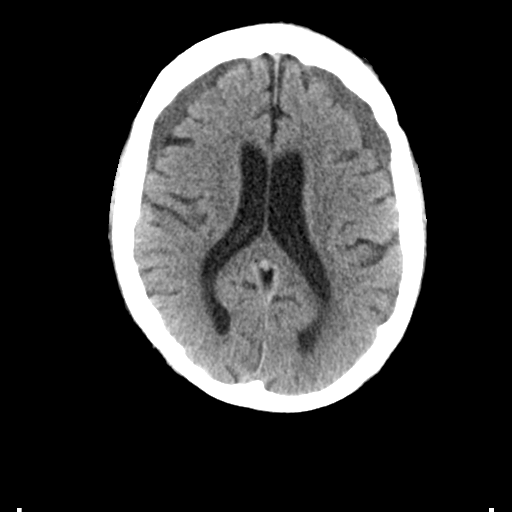
[im 18/34  bone]
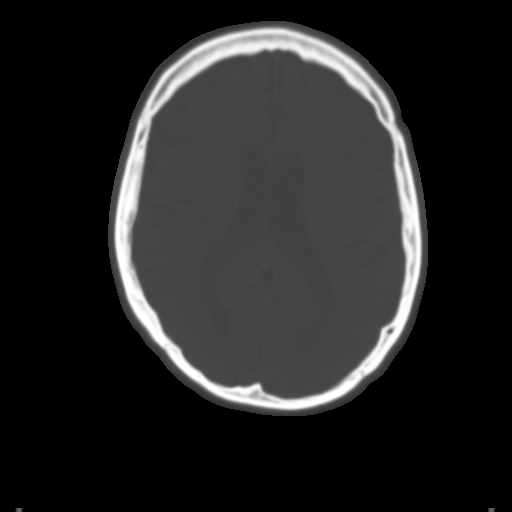
[im 21/34  brain]
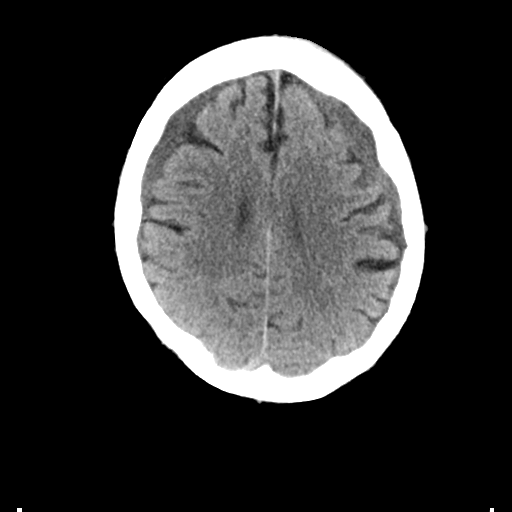
[im 24/34  brain]
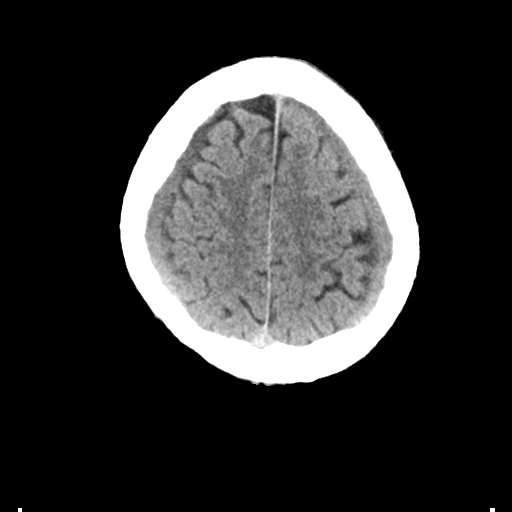
[im 28/34  brain]
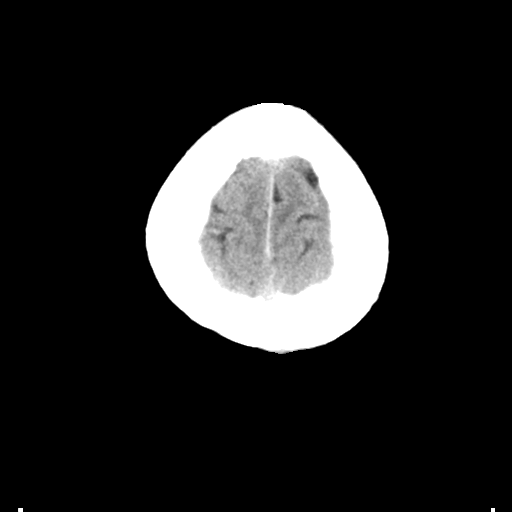
[im 31/34  brain]
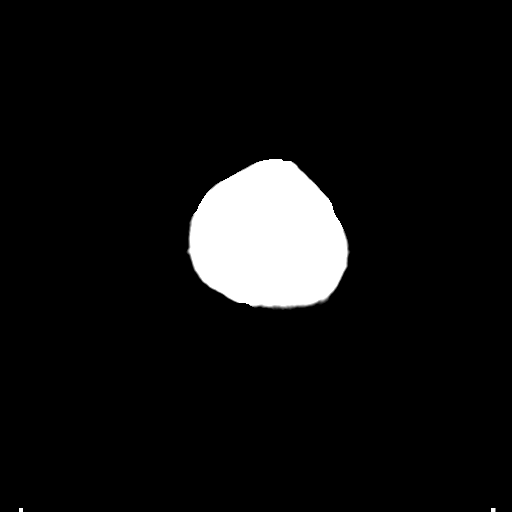
[im 31/34  bone]
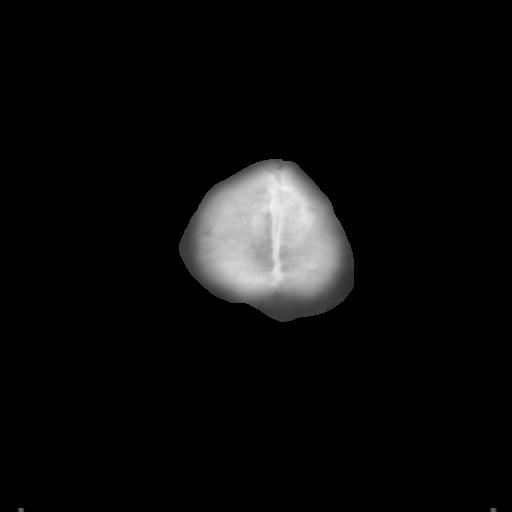

[Series 5: head 3.0 mpr cor · coronal · 0.35mm/px · 3 of 72 slices shown]
[im 24/72  brain]
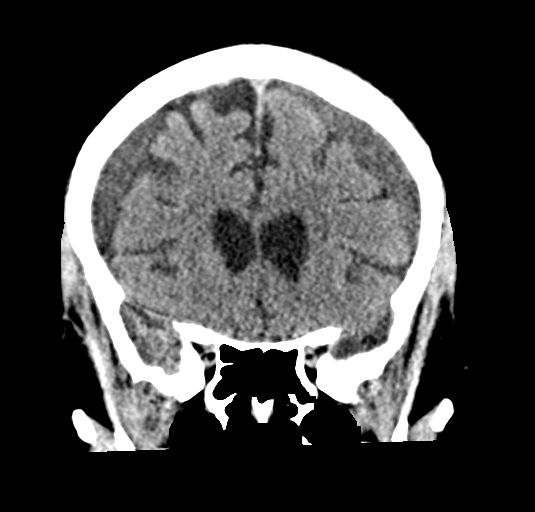
[im 32/72  brain]
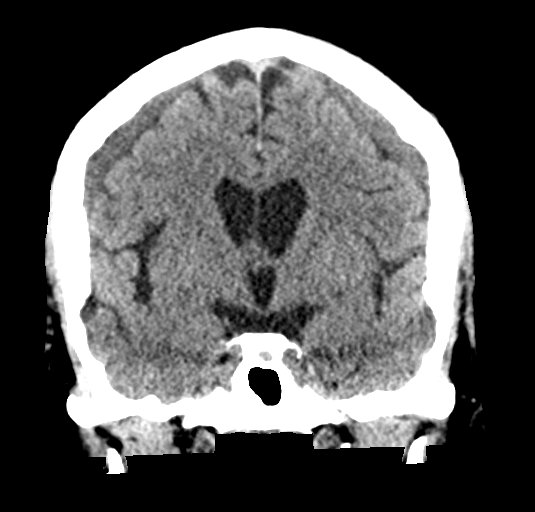
[im 40/72  brain]
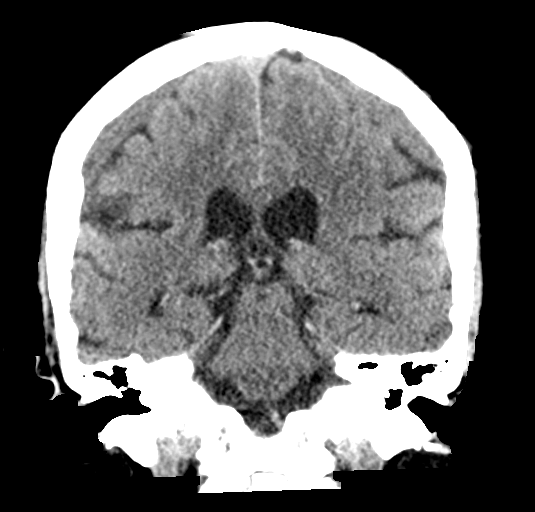

[Series 6: head 3.0 mpr sag · sagittal · 0.36mm/px · 3 of 61 slices shown]
[im 21/61  brain]
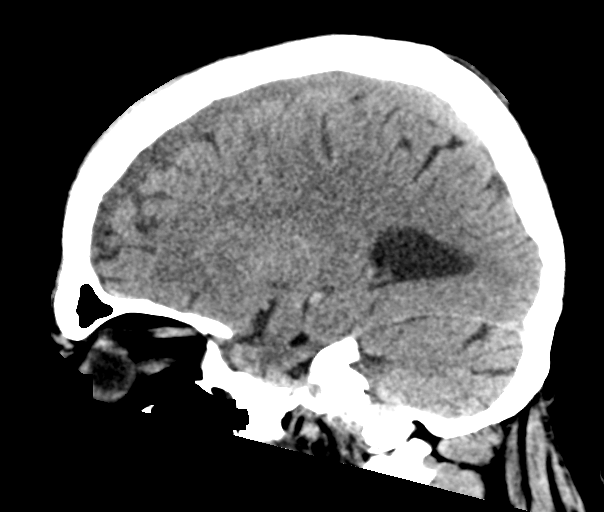
[im 31/61  brain]
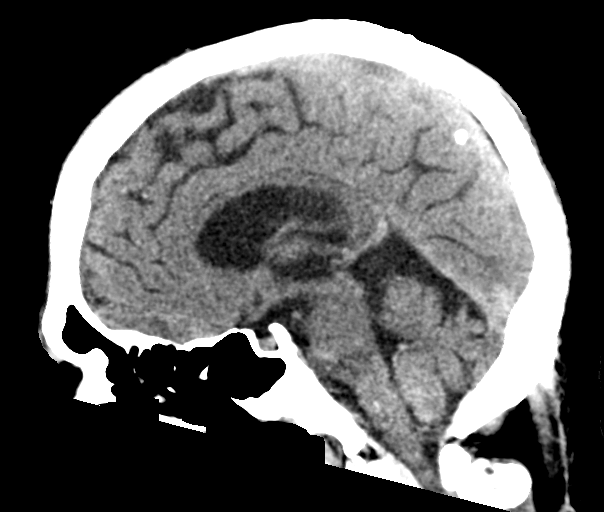
[im 41/61  brain]
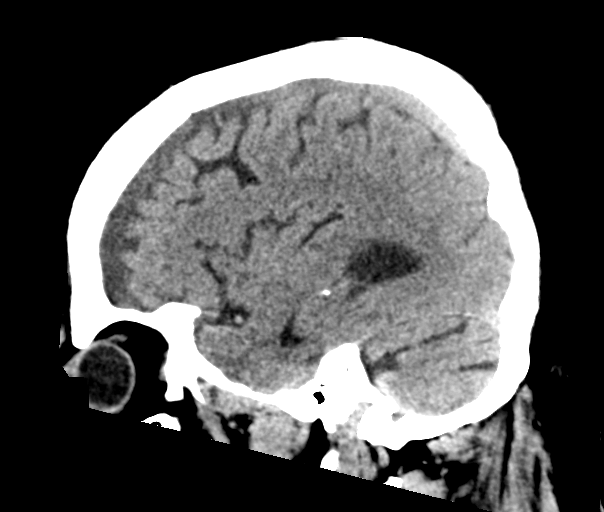

[15 of 47 positions shown; findings below may reference images not displayed]

FINDINGS: CT HEAD FINDINGS

Brain: Bilateral hemispheric subdural collection appear larger in
size since the prior CT of 05/14/2022 and measuring approximately 12
mm in thickness over the left frontal lobe. There is increased
attenuation of the extra-axial collections bilaterally likely
representing interval development of subdural hemorrhage, likely
subacute. No definite acute intracranial hemorrhage. There is
associated mild mass effect on the frontal lobes. No midline shift.
There is mild age-related atrophy and chronic microvascular ischemic
changes.

Vascular: No hyperdense vessel or unexpected calcification.

Skull: Normal. Negative for fracture or focal lesion.

Sinuses/Orbits: No acute finding.

Other: None

CT CERVICAL SPINE FINDINGS

Alignment: No acute subluxation. There is reversal of normal
cervical lordosis which may be positional or due to muscle spasm.

Skull base and vertebrae: No acute fracture. C4 and C5 left hemi
laminectomy.

Soft tissues and spinal canal: No prevertebral fluid or swelling. No
visible canal hematoma.

Disc levels:  Multilevel degenerative changes.

Upper chest: Negative.

Other: None
IMPRESSION: 1. Bilateral hemispheric chronic subdural hygroma with probable
interval development of subacute subdural bleed. No definite acute
intracranial hemorrhage. No midline shift.
2. No acute cervical spine fracture or subluxation.

## 2022-08-17 DIAGNOSIS — Z9181 History of falling: Secondary | ICD-10-CM | POA: Diagnosis not present

## 2022-08-19 DIAGNOSIS — G4700 Insomnia, unspecified: Secondary | ICD-10-CM | POA: Diagnosis not present

## 2022-08-19 DIAGNOSIS — Z9181 History of falling: Secondary | ICD-10-CM | POA: Diagnosis not present

## 2022-08-19 DIAGNOSIS — R269 Unspecified abnormalities of gait and mobility: Secondary | ICD-10-CM | POA: Diagnosis not present

## 2022-08-25 DIAGNOSIS — M6281 Muscle weakness (generalized): Secondary | ICD-10-CM | POA: Diagnosis not present

## 2022-08-25 DIAGNOSIS — E46 Unspecified protein-calorie malnutrition: Secondary | ICD-10-CM | POA: Diagnosis not present

## 2022-08-25 DIAGNOSIS — G808 Other cerebral palsy: Secondary | ICD-10-CM | POA: Diagnosis not present

## 2022-08-25 DIAGNOSIS — F03918 Unspecified dementia, unspecified severity, with other behavioral disturbance: Secondary | ICD-10-CM | POA: Diagnosis not present

## 2022-08-30 DIAGNOSIS — G4700 Insomnia, unspecified: Secondary | ICD-10-CM | POA: Diagnosis not present

## 2022-08-30 DIAGNOSIS — F0390 Unspecified dementia without behavioral disturbance: Secondary | ICD-10-CM | POA: Diagnosis not present

## 2022-08-30 DIAGNOSIS — F419 Anxiety disorder, unspecified: Secondary | ICD-10-CM | POA: Diagnosis not present

## 2022-08-30 DIAGNOSIS — E43 Unspecified severe protein-calorie malnutrition: Secondary | ICD-10-CM | POA: Diagnosis not present

## 2022-09-03 DIAGNOSIS — M25551 Pain in right hip: Secondary | ICD-10-CM | POA: Diagnosis not present

## 2022-09-03 DIAGNOSIS — M1611 Unilateral primary osteoarthritis, right hip: Secondary | ICD-10-CM | POA: Diagnosis not present

## 2022-09-05 DIAGNOSIS — W19XXXA Unspecified fall, initial encounter: Secondary | ICD-10-CM | POA: Diagnosis not present

## 2022-09-05 DIAGNOSIS — R251 Tremor, unspecified: Secondary | ICD-10-CM | POA: Diagnosis not present

## 2022-09-05 DIAGNOSIS — Z9181 History of falling: Secondary | ICD-10-CM | POA: Diagnosis not present

## 2022-09-09 DIAGNOSIS — E1165 Type 2 diabetes mellitus with hyperglycemia: Secondary | ICD-10-CM | POA: Diagnosis not present

## 2022-09-12 DIAGNOSIS — S065XAD Traumatic subdural hemorrhage with loss of consciousness status unknown, subsequent encounter: Secondary | ICD-10-CM | POA: Diagnosis not present

## 2022-09-12 DIAGNOSIS — G319 Degenerative disease of nervous system, unspecified: Secondary | ICD-10-CM | POA: Diagnosis not present

## 2022-09-12 DIAGNOSIS — S065XAA Traumatic subdural hemorrhage with loss of consciousness status unknown, initial encounter: Secondary | ICD-10-CM | POA: Diagnosis not present

## 2022-09-14 DIAGNOSIS — E1165 Type 2 diabetes mellitus with hyperglycemia: Secondary | ICD-10-CM | POA: Diagnosis not present

## 2022-09-14 DIAGNOSIS — Z9181 History of falling: Secondary | ICD-10-CM | POA: Diagnosis not present

## 2022-09-15 DIAGNOSIS — I10 Essential (primary) hypertension: Secondary | ICD-10-CM | POA: Diagnosis not present

## 2022-09-16 DIAGNOSIS — M7030 Other bursitis of elbow, unspecified elbow: Secondary | ICD-10-CM | POA: Diagnosis not present

## 2022-09-16 DIAGNOSIS — Z9181 History of falling: Secondary | ICD-10-CM | POA: Diagnosis not present

## 2022-09-23 DIAGNOSIS — M6281 Muscle weakness (generalized): Secondary | ICD-10-CM | POA: Diagnosis not present

## 2022-09-23 DIAGNOSIS — Z9181 History of falling: Secondary | ICD-10-CM | POA: Diagnosis not present

## 2022-09-24 DIAGNOSIS — R262 Difficulty in walking, not elsewhere classified: Secondary | ICD-10-CM | POA: Diagnosis not present

## 2022-09-24 DIAGNOSIS — S065XAD Traumatic subdural hemorrhage with loss of consciousness status unknown, subsequent encounter: Secondary | ICD-10-CM | POA: Diagnosis not present

## 2022-09-24 DIAGNOSIS — M6281 Muscle weakness (generalized): Secondary | ICD-10-CM | POA: Diagnosis not present

## 2022-09-24 DIAGNOSIS — R2681 Unsteadiness on feet: Secondary | ICD-10-CM | POA: Diagnosis not present

## 2022-09-24 DIAGNOSIS — Z9181 History of falling: Secondary | ICD-10-CM | POA: Diagnosis not present

## 2022-09-24 DIAGNOSIS — M1990 Unspecified osteoarthritis, unspecified site: Secondary | ICD-10-CM | POA: Diagnosis not present

## 2022-09-26 DIAGNOSIS — M1990 Unspecified osteoarthritis, unspecified site: Secondary | ICD-10-CM | POA: Diagnosis not present

## 2022-09-26 DIAGNOSIS — R2681 Unsteadiness on feet: Secondary | ICD-10-CM | POA: Diagnosis not present

## 2022-09-26 DIAGNOSIS — M6281 Muscle weakness (generalized): Secondary | ICD-10-CM | POA: Diagnosis not present

## 2022-09-26 DIAGNOSIS — Z9181 History of falling: Secondary | ICD-10-CM | POA: Diagnosis not present

## 2022-09-26 DIAGNOSIS — S065XAD Traumatic subdural hemorrhage with loss of consciousness status unknown, subsequent encounter: Secondary | ICD-10-CM | POA: Diagnosis not present

## 2022-09-26 DIAGNOSIS — R262 Difficulty in walking, not elsewhere classified: Secondary | ICD-10-CM | POA: Diagnosis not present

## 2022-09-27 DIAGNOSIS — S065XAD Traumatic subdural hemorrhage with loss of consciousness status unknown, subsequent encounter: Secondary | ICD-10-CM | POA: Diagnosis not present

## 2022-09-27 DIAGNOSIS — Z9181 History of falling: Secondary | ICD-10-CM | POA: Diagnosis not present

## 2022-09-27 DIAGNOSIS — M6281 Muscle weakness (generalized): Secondary | ICD-10-CM | POA: Diagnosis not present

## 2022-09-27 DIAGNOSIS — M1990 Unspecified osteoarthritis, unspecified site: Secondary | ICD-10-CM | POA: Diagnosis not present

## 2022-09-27 DIAGNOSIS — R2681 Unsteadiness on feet: Secondary | ICD-10-CM | POA: Diagnosis not present

## 2022-09-27 DIAGNOSIS — R262 Difficulty in walking, not elsewhere classified: Secondary | ICD-10-CM | POA: Diagnosis not present

## 2022-09-28 DIAGNOSIS — M1990 Unspecified osteoarthritis, unspecified site: Secondary | ICD-10-CM | POA: Diagnosis not present

## 2022-09-28 DIAGNOSIS — S065XAD Traumatic subdural hemorrhage with loss of consciousness status unknown, subsequent encounter: Secondary | ICD-10-CM | POA: Diagnosis not present

## 2022-09-28 DIAGNOSIS — Z9181 History of falling: Secondary | ICD-10-CM | POA: Diagnosis not present

## 2022-09-28 DIAGNOSIS — R262 Difficulty in walking, not elsewhere classified: Secondary | ICD-10-CM | POA: Diagnosis not present

## 2022-09-28 DIAGNOSIS — R2681 Unsteadiness on feet: Secondary | ICD-10-CM | POA: Diagnosis not present

## 2022-09-28 DIAGNOSIS — M6281 Muscle weakness (generalized): Secondary | ICD-10-CM | POA: Diagnosis not present

## 2022-09-30 DIAGNOSIS — M1990 Unspecified osteoarthritis, unspecified site: Secondary | ICD-10-CM | POA: Diagnosis not present

## 2022-09-30 DIAGNOSIS — M6281 Muscle weakness (generalized): Secondary | ICD-10-CM | POA: Diagnosis not present

## 2022-09-30 DIAGNOSIS — R2681 Unsteadiness on feet: Secondary | ICD-10-CM | POA: Diagnosis not present

## 2022-09-30 DIAGNOSIS — R262 Difficulty in walking, not elsewhere classified: Secondary | ICD-10-CM | POA: Diagnosis not present

## 2022-09-30 DIAGNOSIS — Z9181 History of falling: Secondary | ICD-10-CM | POA: Diagnosis not present

## 2022-09-30 DIAGNOSIS — S065XAD Traumatic subdural hemorrhage with loss of consciousness status unknown, subsequent encounter: Secondary | ICD-10-CM | POA: Diagnosis not present

## 2022-10-03 DIAGNOSIS — S065XAD Traumatic subdural hemorrhage with loss of consciousness status unknown, subsequent encounter: Secondary | ICD-10-CM | POA: Diagnosis not present

## 2022-10-03 DIAGNOSIS — M6281 Muscle weakness (generalized): Secondary | ICD-10-CM | POA: Diagnosis not present

## 2022-10-03 DIAGNOSIS — R2681 Unsteadiness on feet: Secondary | ICD-10-CM | POA: Diagnosis not present

## 2022-10-03 DIAGNOSIS — Z9181 History of falling: Secondary | ICD-10-CM | POA: Diagnosis not present

## 2022-10-03 DIAGNOSIS — R262 Difficulty in walking, not elsewhere classified: Secondary | ICD-10-CM | POA: Diagnosis not present

## 2022-10-03 DIAGNOSIS — M1990 Unspecified osteoarthritis, unspecified site: Secondary | ICD-10-CM | POA: Diagnosis not present

## 2022-10-04 DIAGNOSIS — F03918 Unspecified dementia, unspecified severity, with other behavioral disturbance: Secondary | ICD-10-CM | POA: Diagnosis not present

## 2022-10-04 DIAGNOSIS — Z9181 History of falling: Secondary | ICD-10-CM | POA: Diagnosis not present

## 2022-10-04 DIAGNOSIS — R262 Difficulty in walking, not elsewhere classified: Secondary | ICD-10-CM | POA: Diagnosis not present

## 2022-10-04 DIAGNOSIS — M6281 Muscle weakness (generalized): Secondary | ICD-10-CM | POA: Diagnosis not present

## 2022-10-04 DIAGNOSIS — R2681 Unsteadiness on feet: Secondary | ICD-10-CM | POA: Diagnosis not present

## 2022-10-04 DIAGNOSIS — S065XAD Traumatic subdural hemorrhage with loss of consciousness status unknown, subsequent encounter: Secondary | ICD-10-CM | POA: Diagnosis not present

## 2022-10-04 DIAGNOSIS — G808 Other cerebral palsy: Secondary | ICD-10-CM | POA: Diagnosis not present

## 2022-10-04 DIAGNOSIS — M1990 Unspecified osteoarthritis, unspecified site: Secondary | ICD-10-CM | POA: Diagnosis not present

## 2022-10-05 DIAGNOSIS — S065XAD Traumatic subdural hemorrhage with loss of consciousness status unknown, subsequent encounter: Secondary | ICD-10-CM | POA: Diagnosis not present

## 2022-10-05 DIAGNOSIS — R2681 Unsteadiness on feet: Secondary | ICD-10-CM | POA: Diagnosis not present

## 2022-10-05 DIAGNOSIS — M6281 Muscle weakness (generalized): Secondary | ICD-10-CM | POA: Diagnosis not present

## 2022-10-05 DIAGNOSIS — R262 Difficulty in walking, not elsewhere classified: Secondary | ICD-10-CM | POA: Diagnosis not present

## 2022-10-05 DIAGNOSIS — Z9181 History of falling: Secondary | ICD-10-CM | POA: Diagnosis not present

## 2022-10-05 DIAGNOSIS — M1990 Unspecified osteoarthritis, unspecified site: Secondary | ICD-10-CM | POA: Diagnosis not present

## 2022-10-06 DIAGNOSIS — S065XAD Traumatic subdural hemorrhage with loss of consciousness status unknown, subsequent encounter: Secondary | ICD-10-CM | POA: Diagnosis not present

## 2022-10-06 DIAGNOSIS — R262 Difficulty in walking, not elsewhere classified: Secondary | ICD-10-CM | POA: Diagnosis not present

## 2022-10-06 DIAGNOSIS — R2681 Unsteadiness on feet: Secondary | ICD-10-CM | POA: Diagnosis not present

## 2022-10-06 DIAGNOSIS — M6281 Muscle weakness (generalized): Secondary | ICD-10-CM | POA: Diagnosis not present

## 2022-10-06 DIAGNOSIS — M1990 Unspecified osteoarthritis, unspecified site: Secondary | ICD-10-CM | POA: Diagnosis not present

## 2022-10-06 DIAGNOSIS — Z9181 History of falling: Secondary | ICD-10-CM | POA: Diagnosis not present

## 2022-10-07 DIAGNOSIS — R262 Difficulty in walking, not elsewhere classified: Secondary | ICD-10-CM | POA: Diagnosis not present

## 2022-10-07 DIAGNOSIS — Z9181 History of falling: Secondary | ICD-10-CM | POA: Diagnosis not present

## 2022-10-07 DIAGNOSIS — R2681 Unsteadiness on feet: Secondary | ICD-10-CM | POA: Diagnosis not present

## 2022-10-07 DIAGNOSIS — S065XAD Traumatic subdural hemorrhage with loss of consciousness status unknown, subsequent encounter: Secondary | ICD-10-CM | POA: Diagnosis not present

## 2022-10-07 DIAGNOSIS — M6281 Muscle weakness (generalized): Secondary | ICD-10-CM | POA: Diagnosis not present

## 2022-10-07 DIAGNOSIS — M1990 Unspecified osteoarthritis, unspecified site: Secondary | ICD-10-CM | POA: Diagnosis not present

## 2022-10-08 DIAGNOSIS — Z9181 History of falling: Secondary | ICD-10-CM | POA: Diagnosis not present

## 2022-10-08 DIAGNOSIS — S065XAD Traumatic subdural hemorrhage with loss of consciousness status unknown, subsequent encounter: Secondary | ICD-10-CM | POA: Diagnosis not present

## 2022-10-08 DIAGNOSIS — R262 Difficulty in walking, not elsewhere classified: Secondary | ICD-10-CM | POA: Diagnosis not present

## 2022-10-08 DIAGNOSIS — M6281 Muscle weakness (generalized): Secondary | ICD-10-CM | POA: Diagnosis not present

## 2022-10-08 DIAGNOSIS — R2681 Unsteadiness on feet: Secondary | ICD-10-CM | POA: Diagnosis not present

## 2022-10-08 DIAGNOSIS — M1990 Unspecified osteoarthritis, unspecified site: Secondary | ICD-10-CM | POA: Diagnosis not present

## 2022-10-09 DIAGNOSIS — R262 Difficulty in walking, not elsewhere classified: Secondary | ICD-10-CM | POA: Diagnosis not present

## 2022-10-09 DIAGNOSIS — R2681 Unsteadiness on feet: Secondary | ICD-10-CM | POA: Diagnosis not present

## 2022-10-09 DIAGNOSIS — S065XAD Traumatic subdural hemorrhage with loss of consciousness status unknown, subsequent encounter: Secondary | ICD-10-CM | POA: Diagnosis not present

## 2022-10-09 DIAGNOSIS — M1990 Unspecified osteoarthritis, unspecified site: Secondary | ICD-10-CM | POA: Diagnosis not present

## 2022-10-09 DIAGNOSIS — Z9181 History of falling: Secondary | ICD-10-CM | POA: Diagnosis not present

## 2022-10-09 DIAGNOSIS — M6281 Muscle weakness (generalized): Secondary | ICD-10-CM | POA: Diagnosis not present

## 2022-10-10 DIAGNOSIS — E559 Vitamin D deficiency, unspecified: Secondary | ICD-10-CM | POA: Diagnosis not present

## 2022-10-10 DIAGNOSIS — Z9181 History of falling: Secondary | ICD-10-CM | POA: Diagnosis not present

## 2022-10-10 DIAGNOSIS — S065XAD Traumatic subdural hemorrhage with loss of consciousness status unknown, subsequent encounter: Secondary | ICD-10-CM | POA: Diagnosis not present

## 2022-10-10 DIAGNOSIS — R296 Repeated falls: Secondary | ICD-10-CM | POA: Diagnosis not present

## 2022-10-10 DIAGNOSIS — M6281 Muscle weakness (generalized): Secondary | ICD-10-CM | POA: Diagnosis not present

## 2022-10-10 DIAGNOSIS — M1990 Unspecified osteoarthritis, unspecified site: Secondary | ICD-10-CM | POA: Diagnosis not present

## 2022-10-10 DIAGNOSIS — E1165 Type 2 diabetes mellitus with hyperglycemia: Secondary | ICD-10-CM | POA: Diagnosis not present

## 2022-10-10 DIAGNOSIS — E43 Unspecified severe protein-calorie malnutrition: Secondary | ICD-10-CM | POA: Diagnosis not present

## 2022-10-10 DIAGNOSIS — R262 Difficulty in walking, not elsewhere classified: Secondary | ICD-10-CM | POA: Diagnosis not present

## 2022-10-10 DIAGNOSIS — R2681 Unsteadiness on feet: Secondary | ICD-10-CM | POA: Diagnosis not present

## 2022-10-13 DIAGNOSIS — E559 Vitamin D deficiency, unspecified: Secondary | ICD-10-CM | POA: Diagnosis not present

## 2022-10-13 DIAGNOSIS — E119 Type 2 diabetes mellitus without complications: Secondary | ICD-10-CM | POA: Diagnosis not present

## 2022-10-17 DIAGNOSIS — E559 Vitamin D deficiency, unspecified: Secondary | ICD-10-CM | POA: Diagnosis not present

## 2022-10-17 DIAGNOSIS — R296 Repeated falls: Secondary | ICD-10-CM | POA: Diagnosis not present

## 2022-10-17 DIAGNOSIS — M6281 Muscle weakness (generalized): Secondary | ICD-10-CM | POA: Diagnosis not present

## 2022-10-17 DIAGNOSIS — E1165 Type 2 diabetes mellitus with hyperglycemia: Secondary | ICD-10-CM | POA: Diagnosis not present

## 2022-10-18 DIAGNOSIS — Z23 Encounter for immunization: Secondary | ICD-10-CM | POA: Diagnosis not present

## 2022-10-21 DIAGNOSIS — E1165 Type 2 diabetes mellitus with hyperglycemia: Secondary | ICD-10-CM | POA: Diagnosis not present

## 2022-10-21 DIAGNOSIS — K219 Gastro-esophageal reflux disease without esophagitis: Secondary | ICD-10-CM | POA: Diagnosis not present

## 2022-10-21 DIAGNOSIS — E559 Vitamin D deficiency, unspecified: Secondary | ICD-10-CM | POA: Diagnosis not present

## 2022-10-21 DIAGNOSIS — M6281 Muscle weakness (generalized): Secondary | ICD-10-CM | POA: Diagnosis not present

## 2022-10-26 DIAGNOSIS — G4700 Insomnia, unspecified: Secondary | ICD-10-CM | POA: Diagnosis not present

## 2022-10-26 DIAGNOSIS — F419 Anxiety disorder, unspecified: Secondary | ICD-10-CM | POA: Diagnosis not present

## 2022-10-31 ENCOUNTER — Telehealth: Payer: Self-pay | Admitting: Anesthesiology

## 2022-10-31 NOTE — Telephone Encounter (Signed)
Patient's brother Gene called to ask if the appointment that is scheduled for patient on 11/03/2022 with Dr Tat is needed since patient is on long term care now. Requests call back.

## 2022-10-31 NOTE — Telephone Encounter (Signed)
Patients brother called back and left message that we have cancelled his upcoming appointment

## 2022-11-03 ENCOUNTER — Ambulatory Visit: Payer: Medicare HMO | Admitting: Neurology

## 2022-11-07 DIAGNOSIS — W19XXXA Unspecified fall, initial encounter: Secondary | ICD-10-CM | POA: Diagnosis not present

## 2022-11-07 DIAGNOSIS — M6281 Muscle weakness (generalized): Secondary | ICD-10-CM | POA: Diagnosis not present

## 2022-11-07 DIAGNOSIS — R296 Repeated falls: Secondary | ICD-10-CM | POA: Diagnosis not present

## 2022-11-07 DIAGNOSIS — G250 Essential tremor: Secondary | ICD-10-CM | POA: Diagnosis not present

## 2022-11-14 ENCOUNTER — Emergency Department (HOSPITAL_COMMUNITY)
Admission: EM | Admit: 2022-11-14 | Discharge: 2022-11-14 | Disposition: A | Discharge status: Skilled Nursing Facility | Payer: Medicare HMO | Attending: Emergency Medicine | Admitting: Emergency Medicine

## 2022-11-14 ENCOUNTER — Emergency Department (HOSPITAL_COMMUNITY): Payer: Medicare HMO

## 2022-11-14 ENCOUNTER — Other Ambulatory Visit: Payer: Self-pay

## 2022-11-14 DIAGNOSIS — R2689 Other abnormalities of gait and mobility: Secondary | ICD-10-CM | POA: Insufficient documentation

## 2022-11-14 DIAGNOSIS — R609 Edema, unspecified: Secondary | ICD-10-CM | POA: Diagnosis not present

## 2022-11-14 DIAGNOSIS — R918 Other nonspecific abnormal finding of lung field: Secondary | ICD-10-CM | POA: Diagnosis not present

## 2022-11-14 DIAGNOSIS — R319 Hematuria, unspecified: Secondary | ICD-10-CM | POA: Insufficient documentation

## 2022-11-14 DIAGNOSIS — M5030 Other cervical disc degeneration, unspecified cervical region: Secondary | ICD-10-CM | POA: Diagnosis not present

## 2022-11-14 DIAGNOSIS — E119 Type 2 diabetes mellitus without complications: Secondary | ICD-10-CM | POA: Insufficient documentation

## 2022-11-14 DIAGNOSIS — Z7984 Long term (current) use of oral hypoglycemic drugs: Secondary | ICD-10-CM | POA: Insufficient documentation

## 2022-11-14 DIAGNOSIS — I10 Essential (primary) hypertension: Secondary | ICD-10-CM | POA: Diagnosis not present

## 2022-11-14 DIAGNOSIS — S4992XA Unspecified injury of left shoulder and upper arm, initial encounter: Secondary | ICD-10-CM | POA: Diagnosis present

## 2022-11-14 DIAGNOSIS — Z043 Encounter for examination and observation following other accident: Secondary | ICD-10-CM | POA: Diagnosis not present

## 2022-11-14 DIAGNOSIS — F0390 Unspecified dementia without behavioral disturbance: Secondary | ICD-10-CM | POA: Diagnosis not present

## 2022-11-14 DIAGNOSIS — S0001XA Abrasion of scalp, initial encounter: Secondary | ICD-10-CM

## 2022-11-14 DIAGNOSIS — S0990XA Unspecified injury of head, initial encounter: Secondary | ICD-10-CM | POA: Diagnosis not present

## 2022-11-14 DIAGNOSIS — S42022A Displaced fracture of shaft of left clavicle, initial encounter for closed fracture: Secondary | ICD-10-CM | POA: Diagnosis not present

## 2022-11-14 DIAGNOSIS — S0003XA Contusion of scalp, initial encounter: Secondary | ICD-10-CM | POA: Diagnosis not present

## 2022-11-14 DIAGNOSIS — M542 Cervicalgia: Secondary | ICD-10-CM | POA: Diagnosis not present

## 2022-11-14 DIAGNOSIS — S0101XA Laceration without foreign body of scalp, initial encounter: Secondary | ICD-10-CM | POA: Diagnosis not present

## 2022-11-14 DIAGNOSIS — S42002A Fracture of unspecified part of left clavicle, initial encounter for closed fracture: Secondary | ICD-10-CM | POA: Diagnosis not present

## 2022-11-14 DIAGNOSIS — Z7401 Bed confinement status: Secondary | ICD-10-CM | POA: Diagnosis not present

## 2022-11-14 DIAGNOSIS — R404 Transient alteration of awareness: Secondary | ICD-10-CM | POA: Diagnosis not present

## 2022-11-14 DIAGNOSIS — S065X0A Traumatic subdural hemorrhage without loss of consciousness, initial encounter: Secondary | ICD-10-CM | POA: Diagnosis not present

## 2022-11-14 DIAGNOSIS — M25519 Pain in unspecified shoulder: Secondary | ICD-10-CM | POA: Diagnosis not present

## 2022-11-14 DIAGNOSIS — I959 Hypotension, unspecified: Secondary | ICD-10-CM | POA: Diagnosis not present

## 2022-11-14 DIAGNOSIS — S42122A Displaced fracture of acromial process, left shoulder, initial encounter for closed fracture: Secondary | ICD-10-CM | POA: Insufficient documentation

## 2022-11-14 DIAGNOSIS — W01198A Fall on same level from slipping, tripping and stumbling with subsequent striking against other object, initial encounter: Secondary | ICD-10-CM | POA: Diagnosis not present

## 2022-11-14 DIAGNOSIS — R262 Difficulty in walking, not elsewhere classified: Secondary | ICD-10-CM

## 2022-11-14 LAB — COMPREHENSIVE METABOLIC PANEL
ALT: 17 U/L (ref 0–44)
AST: 17 U/L (ref 15–41)
Albumin: 3.8 g/dL (ref 3.5–5.0)
Alkaline Phosphatase: 60 U/L (ref 38–126)
Anion gap: 8 (ref 5–15)
BUN: 20 mg/dL (ref 8–23)
CO2: 24 mmol/L (ref 22–32)
Calcium: 9.4 mg/dL (ref 8.9–10.3)
Chloride: 108 mmol/L (ref 98–111)
Creatinine, Ser: 0.89 mg/dL (ref 0.61–1.24)
GFR, Estimated: >60 mL/min (ref >60)
Glucose, Bld: 124 mg/dL — ABNORMAL HIGH (ref 70–99)
Potassium: 3.9 mmol/L (ref 3.5–5.1)
Sodium: 140 mmol/L (ref 135–145)
Total Bilirubin: 0.8 mg/dL (ref 0.3–1.2)
Total Protein: 6.7 g/dL (ref 6.5–8.1)

## 2022-11-14 LAB — CBC WITH DIFFERENTIAL/PLATELET
Abs Immature Granulocytes: 0.02 10*3/uL (ref 0.00–0.07)
Basophils Absolute: 0.1 10*3/uL (ref 0.0–0.1)
Basophils Relative: 1 %
Eosinophils Absolute: 0.1 10*3/uL (ref 0.0–0.5)
Eosinophils Relative: 1 %
HCT: 39.5 % (ref 39.0–52.0)
Hemoglobin: 14 g/dL (ref 13.0–17.0)
Immature Granulocytes: 0 %
Lymphocytes Relative: 16 %
Lymphs Abs: 1.6 10*3/uL (ref 0.7–4.0)
MCH: 29.2 pg (ref 26.0–34.0)
MCHC: 35.4 g/dL (ref 30.0–36.0)
MCV: 82.5 fL (ref 80.0–100.0)
Monocytes Absolute: 0.6 10*3/uL (ref 0.1–1.0)
Monocytes Relative: 6 %
Neutro Abs: 7.5 10*3/uL (ref 1.7–7.7)
Neutrophils Relative %: 76 %
Platelets: 204 10*3/uL (ref 150–400)
RBC: 4.79 MIL/uL (ref 4.22–5.81)
RDW: 13.2 % (ref 11.5–15.5)
WBC: 9.9 10*3/uL (ref 4.0–10.5)
nRBC: 0 % (ref 0.0–0.2)

## 2022-11-14 LAB — URINALYSIS, ROUTINE W REFLEX MICROSCOPIC
Bilirubin Urine: NEGATIVE
Glucose, UA: NEGATIVE mg/dL
Ketones, ur: 5 mg/dL — AB
Leukocytes,Ua: NEGATIVE
Nitrite: NEGATIVE
Protein, ur: NEGATIVE mg/dL
RBC / HPF: >50 RBC/hpf — ABNORMAL HIGH (ref 0–5)
Specific Gravity, Urine: 1.019 (ref 1.005–1.030)
pH: 8 (ref 5.0–8.0)

## 2022-11-14 MED ORDER — ACETAMINOPHEN 500 MG PO TABS
1000.0000 mg | ORAL_TABLET | Freq: Once | ORAL | Status: AC
  Administered 2022-11-14: 1000 mg via ORAL
  Filled 2022-11-14: qty 2

## 2022-11-14 MED ORDER — IOHEXOL 350 MG/ML SOLN
75.0000 mL | Freq: Once | INTRAVENOUS | Status: AC | PRN
  Administered 2022-11-14: 75 mL via INTRAVENOUS

## 2022-11-14 MED ORDER — LORAZEPAM 2 MG/ML IJ SOLN
0.5000 mg | Freq: Once | INTRAMUSCULAR | Status: AC
  Administered 2022-11-14: 0.5 mg via INTRAVENOUS
  Filled 2022-11-14: qty 1

## 2022-11-14 NOTE — ED Notes (Signed)
Pt to CT scan.

## 2022-11-14 NOTE — ED Notes (Signed)
Transport here for pt.  Pt incontinent of urine.  Incontinence care completed.

## 2022-11-14 NOTE — ED Provider Notes (Signed)
[facility] EMERGENCY DEPARTMENT Provider Note   CSN: [id number] Arrival date & time: 11/14/22  3007     History  Chief Complaint  Patient presents with   level 2 fall    Brandon Vargas is a 66 y.o. male.  Pt is a 66 yo male with a pmhx sig for depression, insomnia, BPH, DM, gerd, arthritis, dementia, and frequent falls.  Pt has been put into a SNF for rehab due to the falls.  Pt said that he fell this am. It was unwitnessed.  He does not know how he fell.  He did hit his head.  He has left shoulder pain, but said that his shoulder hurt prior to the fall.  Pt denies any other pain.  He is not on blood thinners.  He presented as a Level 2 Trauma.       Home Medications Prior to Admission medications   Medication Sig Start Date End Date Taking? Authorizing Provider  ACCU-CHEK GUIDE test strip  05/15/20   [provider]  acetaminophen (TYLENOL) 500 MG tablet Take 500 mg by mouth every 6 (six) hours as needed for mild pain.    [provider]  Cholecalciferol (VITAMIN D3) 25 MCG (1000 UT) CAPS Take 1 capsule (1,000 Units total) by mouth daily. 12/03/21   [place], Blondell Reveal, MD  cyclobenzaprine (FLEXERIL) 5 MG tablet Take 1 tablet (5 mg total) by mouth at bedtime as needed for muscle spasms. Patient taking differently: Take 5 mg by mouth daily as needed for muscle spasms. 02/01/22   [place], Blondell Reveal, MD  diclofenac Sodium (VOLTAREN) 1 % GEL Apply 2 g topically 4 (four) times daily. To affected joint. Patient not taking: Reported on 05/14/2022 05/05/22   [place], Blondell Reveal, MD  donepezil (ARICEPT) 10 MG tablet Take 1 tablet (10 mg total) by mouth daily. 12/06/21   [place], Blondell Reveal, MD  Menthol, Topical Analgesic, (BIOFREEZE EX) Apply 1 application. topically daily as needed (For neck pain).    [provider]  metFORMIN (GLUCOPHAGE) 500 MG tablet Take 1 tablet (500 mg total) by mouth daily with breakfast for 14 days, THEN 1 tablet (500 mg  total) 2 (two) times daily with a meal. 05/18/22 08/16/22  Mercy Riding, MD  mirtazapine (REMERON) 7.5 MG tablet Take 7.5 mg by mouth at bedtime. 09/02/21   [provider]  pantoprazole (PROTONIX) 40 MG tablet Take 1 tablet (40 mg total) by mouth daily. 02/22/22   [place], Blondell Reveal, MD  tamsulosin (FLOMAX) 0.4 MG CAPS capsule Take 0.4 mg by mouth daily.    [provider]      Allergies    Metformin and related    Review of Systems   Review of Systems  Musculoskeletal:        Left shoulder pain  Neurological:  Positive for headaches.  All other systems reviewed and are negative.   Physical Exam Updated Vital Signs BP (!) 108/91 (BP Location: Right Arm)   Pulse 92   Temp (!) 97.1 F (36.2 C)   Resp 16   SpO2 100%  Physical Exam Vitals and nursing note reviewed.  Constitutional:      Appearance: Normal appearance.  HENT:     Head: Normocephalic.      Right Ear: External ear normal.     Left Ear: External ear normal.     Nose: Nose normal.     Mouth/Throat:     Mouth: Mucous  membranes are moist.     Pharynx: Oropharynx is clear.  Eyes:     Extraocular Movements: Extraocular movements intact.     Conjunctiva/sclera: Conjunctivae normal.     Pupils: Pupils are equal, round, and reactive to light.  Neck:     Comments: In collar Cardiovascular:     Rate and Rhythm: Normal rate and regular rhythm.     Pulses: Normal pulses.     Heart sounds: Normal heart sounds.  Pulmonary:     Effort: Pulmonary effort is normal.     Breath sounds: Normal breath sounds.  Abdominal:     General: Abdomen is flat. Bowel sounds are normal.     Palpations: Abdomen is soft.  Musculoskeletal:       Arms:  Skin:    General: Skin is warm.     Capillary Refill: Capillary refill takes less than 2 seconds.  Neurological:     General: No focal deficit present.     Mental Status: He is alert and oriented to person, place, and time.  Psychiatric:        Mood and Affect: Mood  normal.        Behavior: Behavior normal.     ED Results / Procedures / Treatments   Labs (all labs ordered are listed, but only abnormal results are displayed) Labs Reviewed  COMPREHENSIVE METABOLIC PANEL - Abnormal; Notable for the following components:      Result Value   Glucose, Bld 124 (*)    All other components within normal limits  URINALYSIS, ROUTINE W REFLEX MICROSCOPIC - Abnormal; Notable for the following components:   Hgb urine dipstick MODERATE (*)    Ketones, ur 5 (*)    RBC / HPF >50 (*)    Bacteria, UA RARE (*)    All other components within normal limits  CBC WITH DIFFERENTIAL/PLATELET    EKG None  Radiology CT CHEST ABDOMEN PELVIS W CONTRAST  Result Date: 11/14/2022 CLINICAL DATA:  Nd a fall, trauma. EXAM: CT CHEST, ABDOMEN, AND PELVIS WITH CONTRAST TECHNIQUE: Multidetector CT imaging of the chest, abdomen and pelvis was performed following the standard protocol during bolus administration of intravenous contrast. RADIATION DOSE REDUCTION: This exam was performed according to the departmental dose-optimization program which includes automated exposure control, adjustment of the mA and/or kV according to patient size and/or use of iterative reconstruction technique. CONTRAST:  5mL OMNIPAQUE IOHEXOL 350 MG/ML SOLN COMPARISON:  Same day chest radiograph, CT chest/abdomen/pelvis 02/15/2022 FINDINGS: CT CHEST FINDINGS Cardiovascular: The heart size is normal. There is no pericardial effusion. The major vasculature of the chest is unremarkable. There is no evidence of traumatic injury to the vasculature. Mediastinum/Nodes: The thyroid is unremarkable. The esophagus is grossly unremarkable. There is no mediastinal, hilar, or axillary lymphadenopathy. Lungs/Pleura: The trachea and central airways are patent. The lungs are well inflated. There is no focal consolidation or pulmonary edema. There is no pleural effusion or pneumothorax. There is no evidence of traumatic  parenchymal injury. The irregularly marginated 1.6 cm by 1.2 cm nodule with surrounding ground-glass opacities in the right upper lobe is similar to the study from 02/15/2022 and remains highly suspicious. Musculoskeletal: Again seen is the mildly angulated left clavicular fracture on the scout image. There is no acute rib fracture. Multiple remote left rib fractures are noted. There is no sternal fracture. There is no acute fracture or traumatic malalignment of the thoracic spine. CT ABDOMEN PELVIS FINDINGS Hepatobiliary: The liver and gallbladder are unremarkable. There is no biliary  ductal dilatation. There is no evidence of traumatic injury. Pancreas: Unremarkable; no evidence of traumatic parenchymal injury. Spleen: Unremarkable; no evidence of traumatic injury. Adrenals/Urinary Tract: The adrenals are unremarkable. The kidneys are unremarkable, with no focal lesion, stone, hydronephrosis, or hydroureter. There is symmetric excretion of contrast into the collecting systems on the delayed images. There is no evidence of traumatic injury. The bladder is decompressed but grossly unremarkable. Stomach/Bowel: The stomach is unremarkable. There is no evidence of bowel obstruction. There is no abnormal bowel wall thickening or inflammatory change. The appendix is normal. Vascular/Lymphatic: The abdominal aorta is normal in course and caliber. The major branch vessels are patent. The main portal and splenic veins are patent. There is no abdominal or pelvic lymphadenopathy. Reproductive: The prostate and seminal vesicles are unremarkable. Other: There is no ascites or free air.  There is no hemoperitoneum. Musculoskeletal: There is no acute fracture or dislocation in the pelvis. There is degenerative change about both hips. There is no acute fracture or traumatic malalignment of the lumbar spine. There is disc space narrowing and vacuum disc phenomenon at L5-S1. There are remote fractures of the right L1, L2, and likely  L3 spinous process. IMPRESSION: 1. No evidence of acute traumatic injury in the chest, abdomen, or pelvis. 2. 1.2 cm x 1.6 cm irregularly marginated nodule in the right upper lobe is similar to the study from 02/15/2022 and remains highly suspicious for primary bronchogenic neoplasm. Recommend referral to pulmonology or thoracic surgery. Electronically Signed   By: Valetta Mole M.D.   On: 11/14/2022 13:51   CT Head Wo Contrast  Result Date: 11/14/2022 CLINICAL DATA:  Status post fall. EXAM: CT HEAD WITHOUT CONTRAST CT CERVICAL SPINE WITHOUT CONTRAST TECHNIQUE: Multidetector CT imaging of the head and cervical spine was performed following the standard protocol without intravenous contrast. Multiplanar CT image reconstructions of the cervical spine were also generated. RADIATION DOSE REDUCTION: This exam was performed according to the departmental dose-optimization program which includes automated exposure control, adjustment of the mA and/or kV according to patient size and/or use of iterative reconstruction technique. COMPARISON:  07/19/2022 FINDINGS: CT HEAD FINDINGS Brain: No evidence of acute infarction, hemorrhage, hydrocephalus, extra-axial collection or mass lesion/mass effect. 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 The previously noted bilateral subdural hematomas have resolved in the interval. Vascular: No hyperdense vessel or unexpected calcification. Skull: Normal. Negative for fracture or focal lesion. Sinuses/Orbits: No acute finding. Other: Small left parietal scalp hematoma noted, image 22/3. CT CERVICAL SPINE FINDINGS Alignment: No signs of acute posttraumatic mile alignment of the cervical spine. Reversal of normal cervical lordosis. Skull base and vertebrae: No acute fracture. Previous C4 and C5 left hemi laminectomy. Soft tissues and spinal canal: No prevertebral fluid or swelling. No visible canal hematoma. Disc levels: Multilevel  disc space narrowing and ventral endplate spurring identified. Upper chest: Negative. Other: Fracture involving the left clavicle appears nondisplaced, image 54/5. IMPRESSION: 1. No acute intracranial abnormality. 2. 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 The previously noted bilateral subdural hematomas have resolved in the interval. 3. Small left parietal scalp hematoma. 4. No evidence for acute cervical spine fracture or subluxation. 5. Left clavicle fracture. 6. Cervical degenerative disc disease. Electronically Signed   By: Kerby Moors M.D.   On: 11/14/2022 09:39   CT Cervical Spine Wo Contrast  Result Date: 11/14/2022 CLINICAL DATA:  Status post fall. EXAM: CT HEAD WITHOUT CONTRAST CT CERVICAL SPINE WITHOUT CONTRAST TECHNIQUE: Multidetector  CT imaging of the head and cervical spine was performed following the standard protocol without intravenous contrast. Multiplanar CT image reconstructions of the cervical spine were also generated. RADIATION DOSE REDUCTION: This exam was performed according to the departmental dose-optimization program which includes automated exposure control, adjustment of the mA and/or kV according to patient size and/or use of iterative reconstruction technique. COMPARISON:  07/19/2022 FINDINGS: CT HEAD FINDINGS Brain: No evidence of acute infarction, hemorrhage, hydrocephalus, extra-axial collection or mass lesion/mass effect. 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 The previously noted bilateral subdural hematomas have resolved in the interval. Vascular: No hyperdense vessel or unexpected calcification. Skull: Normal. Negative for fracture or focal lesion. Sinuses/Orbits: No acute finding. Other: Small left parietal scalp hematoma noted, image 22/3. CT CERVICAL SPINE FINDINGS Alignment: No signs of acute posttraumatic mile alignment of the cervical spine.  Reversal of normal cervical lordosis. Skull base and vertebrae: No acute fracture. Previous C4 and C5 left hemi laminectomy. Soft tissues and spinal canal: No prevertebral fluid or swelling. No visible canal hematoma. Disc levels: Multilevel disc space narrowing and ventral endplate spurring identified. Upper chest: Negative. Other: Fracture involving the left clavicle appears nondisplaced, image 54/5. IMPRESSION: 1. No acute intracranial abnormality. 2. 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 The previously noted bilateral subdural hematomas have resolved in the interval. 3. Small left parietal scalp hematoma. 4. No evidence for acute cervical spine fracture or subluxation. 5. Left clavicle fracture. 6. Cervical degenerative disc disease. Electronically Signed   By: Kerby Moors M.D.   On: 11/14/2022 09:39   DG Pelvis Portable  Result Date: 11/14/2022 CLINICAL DATA:  Fall EXAM: PORTABLE PELVIS 1-2 VIEWS COMPARISON:  None Available. FINDINGS: There is no evidence of acute fracture. Alignment is normal. There is moderate osteoarthritis of the hips with bilateral cam deformities. IMPRESSION: No evidence of acute fracture. 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 Electronically Signed   By: Maurine Simmering M.D.   On: 11/14/2022 09:10   DG Chest Portable 1 View  Result Date: 11/14/2022 CLINICAL DATA:  Fall EXAM: PORTABLE CHEST 1 VIEW COMPARISON:  Radiograph 05/14/2022 FINDINGS: Unchanged cardiomediastinal silhouette. There is no focal airspace consolidation. There is no pleural effusion or pneumothorax. There is a segmental left clavicle fracture, nondisplaced and angulated in the mid clavicle and longitudinal and displaced of the distal clavicle extending to the [facility] joint. IMPRESSION: Nondisplaced, angulated left mid clavicle fracture and displaced left distal clavicle fracture extending to the [facility] joint. No acute cardiopulmonary  disease. Electronically Signed   By: Maurine Simmering M.D.   On: 11/14/2022 09:09   DG Shoulder Left Portable  Result Date: 11/14/2022 CLINICAL DATA:  Fall EXAM: LEFT SHOULDER COMPARISON:  None Available. FINDINGS: Oblique displaced fracture of the distal third of the clavicle and transverse angulated fracture of the middle third of the left clavicle. Comminuted minimally displaced fracture of the acromion process of the scapula. No evidence of dislocation. Soft tissue swelling of the shoulder. IMPRESSION: 1. Displaced fractures of the middle and distal clavicle. 2. Comminuted minimally displaced fracture of the acromion process of the scapula. Electronically Signed   By: Yetta Glassman M.D.   On: 11/14/2022 09:00    Procedures .Marland KitchenLaceration Repair  Date/Time: 11/14/2022 10:44 AM  Performed by: Isla Pence, MD Authorized by: Isla Pence, MD   Consent:    Consent obtained:  Emergent situation Universal protocol:    Patient identity confirmed:  Arm band Anesthesia:  Anesthesia method:  None Laceration details:    Location:  Scalp   Scalp location:  L parietal   Length (cm):  1 Pre-procedure details:    Preparation:  Patient was prepped and draped in usual sterile fashion Treatment:    Area cleansed with:  Saline   Amount of cleaning:  Standard Skin repair:    Repair method:  Tissue adhesive Repair type:    Repair type:  Simple Post-procedure details:    Procedure completion:  Tolerated well, no immediate complications     Medications Ordered in ED Medications  acetaminophen (TYLENOL) tablet 1,000 mg (1,000 mg Oral Given 11/14/22 1112)  LORazepam (ATIVAN) injection 0.5 mg (0.5 mg Intravenous Given 11/14/22 1112)  LORazepam (ATIVAN) injection 0.5 mg (0.5 mg Intravenous Given 11/14/22 1316)  iohexol (OMNIPAQUE) 350 MG/ML injection 75 mL (75 mLs Intravenous Contrast Given 11/14/22 1321)    ED Course/ Medical Decision Making/ A&P                           Medical  Decision Making Amount and/or Complexity of Data Reviewed Labs: ordered. Radiology: ordered.  Risk OTC drugs. Prescription drug management.   This patient presents to the ED for concern of fall, this involves an extensive number of treatment options, and is a complaint that carries with it a high risk of complications and morbidity.  The differential diagnosis includes multiple trauma   Co morbidities that complicate the patient evaluation  depression, insomnia, BPH, DM, gerd, arthritis, dementia, and frequent falls   Additional history obtained:  Additional history obtained from epic chart review External records from outside source obtained and reviewed including EMS report   Lab Tests:  I Ordered, and personally interpreted labs.  The pertinent results include:  cbc, cmp, ua   Imaging Studies ordered:  I ordered imaging studies including cxr, left shoulder, pelvis and ct head and c-spine  I independently visualized and interpreted imaging which showed  Left shoulder: 1. Displaced fractures of the middle and distal clavicle.  2. Comminuted minimally displaced fracture of the acromion process  of the scapula.  CT chest/abd/pelvis: 1. No evidence of acute traumatic injury in the chest, abdomen, or  pelvis.  2. 1.2 cm x 1.6 cm irregularly marginated nodule in the right upper  lobe is similar to the study from 02/15/2022 and remains highly  suspicious for primary bronchogenic neoplasm. Recommend referral to  pulmonology or thoracic surgery.    I agree with the radiologist interpretation   Cardiac Monitoring:  The patient was maintained on a cardiac monitor.  I personally viewed and interpreted the cardiac monitored which showed an underlying rhythm of: sb   Medicines ordered and prescription drug management:  I ordered medication including tylenol  for pain  Reevaluation of the patient after these medicines showed that the patient improved I have reviewed the  patients home medicines and have made adjustments as needed   Test Considered:  ct   Critical Interventions:  ct   Problem List / ED Course:  Fall:  left clavicle and acromion process fractures.  Pt placed in a sling.  Pt is very unsteady on his feet, but is able to stand up without pain.  Pt will need f/u with ortho. Hematuria:  pt does have blood in his urine, but no trauma on ct.  He will need to f/u with urology. Lung mass:  this has been present since CT in Feb.  Pt will need to  f/u with pulm.  Referral placed.   Reevaluation:  After the interventions noted above, I reevaluated the patient and found that they have :improved   Social Determinants of Health:  Lives in snf   Dispostion:  After consideration of the diagnostic results and the patients response to treatment, I feel that the patent would benefit from discharge with outpatient f/u.          Final Clinical Impression(s) / ED Diagnoses Final diagnoses:  Abrasion of scalp, initial encounter  Closed displaced fracture of shaft of left clavicle, initial encounter  Closed displaced fracture of acromial process of left scapula, initial encounter  Ambulatory dysfunction  Hematuria, unspecified type    Rx / DC Orders ED Discharge Orders     None         Isla Pence, MD 11/14/22 1424

## 2022-11-14 NOTE — Progress Notes (Signed)
Orthopedic Tech Progress Note Patient Details:  Brandon Vargas 12-01-1956 [id number]  Level 2 trauma this morning   Patient ID: Brandon Vargas, male   DOB: Apr 28, 1956, 66 y.o.   MRN: [id number]  Brandon Vargas 11/14/2022, 10:32 AM

## 2022-11-14 NOTE — ED Notes (Signed)
Pt unable to ambulate, very unstable when standing.  Pt reports no pain when standing.  MD made aware.

## 2022-11-14 NOTE — ED Notes (Signed)
Pt keeps trying to get out of bed, picking at cords/IV. Unable to redirect pt.  IV secured with coban.  Pt moved out into hallway for close observation.

## 2022-11-14 NOTE — ED Triage Notes (Incomplete)
Pt BIB by EMS for an unwitnessed fall with LOC this morning.  No blood thinners.  Small hematoma to back of head.  Swelling and tenderness on right elbow, pain in left shoulder. Pt currently in rehab facility, fell when getting up trying to walk.

## 2022-11-14 NOTE — ED Notes (Signed)
[facility] called for transport.  

## 2022-11-14 NOTE — Discharge Instructions (Addendum)
You do have a mass in your lung that is suspicious for cancer.  You need to follow with pulmonology for further eval.  A referral to pulm has been placed.  You have blood in your urine.  You will need follow up with urology for this problem.  You will need to follow up with ortho for your fractured clavicle and scapula.

## 2022-11-16 DIAGNOSIS — C3490 Malignant neoplasm of unspecified part of unspecified bronchus or lung: Secondary | ICD-10-CM | POA: Diagnosis not present

## 2022-11-16 DIAGNOSIS — S42002D Fracture of unspecified part of left clavicle, subsequent encounter for fracture with routine healing: Secondary | ICD-10-CM | POA: Diagnosis not present

## 2022-11-18 DIAGNOSIS — R296 Repeated falls: Secondary | ICD-10-CM | POA: Diagnosis not present

## 2022-11-22 DIAGNOSIS — Z23 Encounter for immunization: Secondary | ICD-10-CM | POA: Diagnosis not present

## 2022-11-23 ENCOUNTER — Telehealth: Payer: Self-pay

## 2022-11-23 NOTE — Telephone Encounter (Signed)
        Patient  visited [place] on 11/20    Telephone encounter attempt :  1st  A HIPAA compliant voice message was left requesting a return call.  Instructed patient to call back   [place], [facility]  [phone] [address], [place], [place] [address] Phone: [phone] Email: [email or web address]

## 2022-11-24 ENCOUNTER — Telehealth: Payer: Self-pay

## 2022-11-24 NOTE — Telephone Encounter (Signed)
     Patient  visit on 11/20  at [facility]   Have you been able to follow up with your primary care physician? Yes   The patient was or was not able to obtain any needed medicine or equipment. Yes   Are there diet recommendations that you are having difficulty following? NA  Patient expresses understanding of discharge instructions and education provided has no other needs at this time. [place], [facility], Care Management  [phone] [address], [place], [place] [address] Phone: [phone] Email: [email or web address]

## 2022-11-29 DIAGNOSIS — R296 Repeated falls: Secondary | ICD-10-CM | POA: Diagnosis not present

## 2022-11-30 DIAGNOSIS — S42122A Displaced fracture of acromial process, left shoulder, initial encounter for closed fracture: Secondary | ICD-10-CM | POA: Diagnosis not present

## 2022-11-30 DIAGNOSIS — S42032A Displaced fracture of lateral end of left clavicle, initial encounter for closed fracture: Secondary | ICD-10-CM | POA: Diagnosis not present

## 2022-11-30 DIAGNOSIS — S42022A Displaced fracture of shaft of left clavicle, initial encounter for closed fracture: Secondary | ICD-10-CM | POA: Diagnosis not present

## 2022-12-02 DIAGNOSIS — S42002A Fracture of unspecified part of left clavicle, initial encounter for closed fracture: Secondary | ICD-10-CM | POA: Diagnosis not present

## 2022-12-02 DIAGNOSIS — R2689 Other abnormalities of gait and mobility: Secondary | ICD-10-CM | POA: Diagnosis not present

## 2022-12-02 DIAGNOSIS — R2681 Unsteadiness on feet: Secondary | ICD-10-CM | POA: Diagnosis not present

## 2022-12-05 DIAGNOSIS — R2689 Other abnormalities of gait and mobility: Secondary | ICD-10-CM | POA: Diagnosis not present

## 2022-12-05 DIAGNOSIS — S42002A Fracture of unspecified part of left clavicle, initial encounter for closed fracture: Secondary | ICD-10-CM | POA: Diagnosis not present

## 2022-12-05 DIAGNOSIS — R2681 Unsteadiness on feet: Secondary | ICD-10-CM | POA: Diagnosis not present

## 2022-12-06 DIAGNOSIS — S42002A Fracture of unspecified part of left clavicle, initial encounter for closed fracture: Secondary | ICD-10-CM | POA: Diagnosis not present

## 2022-12-06 DIAGNOSIS — R2689 Other abnormalities of gait and mobility: Secondary | ICD-10-CM | POA: Diagnosis not present

## 2022-12-06 DIAGNOSIS — R2681 Unsteadiness on feet: Secondary | ICD-10-CM | POA: Diagnosis not present

## 2022-12-07 ENCOUNTER — Other Ambulatory Visit: Payer: Self-pay | Admitting: Orthopedic Surgery

## 2022-12-07 ENCOUNTER — Encounter: Payer: Self-pay | Admitting: Orthopedic Surgery

## 2022-12-07 DIAGNOSIS — S42002A Fracture of unspecified part of left clavicle, initial encounter for closed fracture: Secondary | ICD-10-CM | POA: Diagnosis not present

## 2022-12-07 DIAGNOSIS — R2689 Other abnormalities of gait and mobility: Secondary | ICD-10-CM | POA: Diagnosis not present

## 2022-12-07 DIAGNOSIS — Q688 Other specified congenital musculoskeletal deformities: Secondary | ICD-10-CM

## 2022-12-07 DIAGNOSIS — R2681 Unsteadiness on feet: Secondary | ICD-10-CM | POA: Diagnosis not present

## 2022-12-08 DIAGNOSIS — R2689 Other abnormalities of gait and mobility: Secondary | ICD-10-CM | POA: Diagnosis not present

## 2022-12-08 DIAGNOSIS — R296 Repeated falls: Secondary | ICD-10-CM | POA: Diagnosis not present

## 2022-12-08 DIAGNOSIS — M6281 Muscle weakness (generalized): Secondary | ICD-10-CM | POA: Diagnosis not present

## 2022-12-08 DIAGNOSIS — S42002D Fracture of unspecified part of left clavicle, subsequent encounter for fracture with routine healing: Secondary | ICD-10-CM | POA: Diagnosis not present

## 2022-12-08 DIAGNOSIS — C3490 Malignant neoplasm of unspecified part of unspecified bronchus or lung: Secondary | ICD-10-CM | POA: Diagnosis not present

## 2022-12-08 DIAGNOSIS — R2681 Unsteadiness on feet: Secondary | ICD-10-CM | POA: Diagnosis not present

## 2022-12-08 DIAGNOSIS — S42002A Fracture of unspecified part of left clavicle, initial encounter for closed fracture: Secondary | ICD-10-CM | POA: Diagnosis not present

## 2022-12-09 DIAGNOSIS — R2689 Other abnormalities of gait and mobility: Secondary | ICD-10-CM | POA: Diagnosis not present

## 2022-12-09 DIAGNOSIS — R2681 Unsteadiness on feet: Secondary | ICD-10-CM | POA: Diagnosis not present

## 2022-12-09 DIAGNOSIS — S42002A Fracture of unspecified part of left clavicle, initial encounter for closed fracture: Secondary | ICD-10-CM | POA: Diagnosis not present

## 2022-12-12 DIAGNOSIS — R2689 Other abnormalities of gait and mobility: Secondary | ICD-10-CM | POA: Diagnosis not present

## 2022-12-12 DIAGNOSIS — S42002A Fracture of unspecified part of left clavicle, initial encounter for closed fracture: Secondary | ICD-10-CM | POA: Diagnosis not present

## 2022-12-12 DIAGNOSIS — R2681 Unsteadiness on feet: Secondary | ICD-10-CM | POA: Diagnosis not present

## 2022-12-13 DIAGNOSIS — S42002A Fracture of unspecified part of left clavicle, initial encounter for closed fracture: Secondary | ICD-10-CM | POA: Diagnosis not present

## 2022-12-13 DIAGNOSIS — R2689 Other abnormalities of gait and mobility: Secondary | ICD-10-CM | POA: Diagnosis not present

## 2022-12-13 DIAGNOSIS — R2681 Unsteadiness on feet: Secondary | ICD-10-CM | POA: Diagnosis not present

## 2022-12-15 DIAGNOSIS — S42002A Fracture of unspecified part of left clavicle, initial encounter for closed fracture: Secondary | ICD-10-CM | POA: Diagnosis not present

## 2022-12-15 DIAGNOSIS — R2681 Unsteadiness on feet: Secondary | ICD-10-CM | POA: Diagnosis not present

## 2022-12-15 DIAGNOSIS — R2689 Other abnormalities of gait and mobility: Secondary | ICD-10-CM | POA: Diagnosis not present

## 2022-12-19 DIAGNOSIS — S42002A Fracture of unspecified part of left clavicle, initial encounter for closed fracture: Secondary | ICD-10-CM | POA: Diagnosis not present

## 2022-12-19 DIAGNOSIS — R2689 Other abnormalities of gait and mobility: Secondary | ICD-10-CM | POA: Diagnosis not present

## 2022-12-19 DIAGNOSIS — R2681 Unsteadiness on feet: Secondary | ICD-10-CM | POA: Diagnosis not present

## 2022-12-20 DIAGNOSIS — R2681 Unsteadiness on feet: Secondary | ICD-10-CM | POA: Diagnosis not present

## 2022-12-20 DIAGNOSIS — S42002A Fracture of unspecified part of left clavicle, initial encounter for closed fracture: Secondary | ICD-10-CM | POA: Diagnosis not present

## 2022-12-20 DIAGNOSIS — R2689 Other abnormalities of gait and mobility: Secondary | ICD-10-CM | POA: Diagnosis not present

## 2022-12-21 DIAGNOSIS — R2681 Unsteadiness on feet: Secondary | ICD-10-CM | POA: Diagnosis not present

## 2022-12-21 DIAGNOSIS — S42002A Fracture of unspecified part of left clavicle, initial encounter for closed fracture: Secondary | ICD-10-CM | POA: Diagnosis not present

## 2022-12-21 DIAGNOSIS — R2689 Other abnormalities of gait and mobility: Secondary | ICD-10-CM | POA: Diagnosis not present

## 2022-12-22 DIAGNOSIS — R2689 Other abnormalities of gait and mobility: Secondary | ICD-10-CM | POA: Diagnosis not present

## 2022-12-22 DIAGNOSIS — R2681 Unsteadiness on feet: Secondary | ICD-10-CM | POA: Diagnosis not present

## 2022-12-22 DIAGNOSIS — S42002A Fracture of unspecified part of left clavicle, initial encounter for closed fracture: Secondary | ICD-10-CM | POA: Diagnosis not present

## 2022-12-29 DIAGNOSIS — C3490 Malignant neoplasm of unspecified part of unspecified bronchus or lung: Secondary | ICD-10-CM | POA: Diagnosis not present

## 2022-12-29 DIAGNOSIS — G4700 Insomnia, unspecified: Secondary | ICD-10-CM | POA: Diagnosis not present

## 2022-12-29 DIAGNOSIS — S42002D Fracture of unspecified part of left clavicle, subsequent encounter for fracture with routine healing: Secondary | ICD-10-CM | POA: Diagnosis not present

## 2022-12-29 DIAGNOSIS — R296 Repeated falls: Secondary | ICD-10-CM | POA: Diagnosis not present

## 2023-01-11 DIAGNOSIS — R52 Pain, unspecified: Secondary | ICD-10-CM | POA: Diagnosis not present

## 2023-01-27 DIAGNOSIS — F419 Anxiety disorder, unspecified: Secondary | ICD-10-CM | POA: Diagnosis not present

## 2023-01-27 DIAGNOSIS — M5030 Other cervical disc degeneration, unspecified cervical region: Secondary | ICD-10-CM | POA: Diagnosis not present

## 2023-01-27 DIAGNOSIS — M542 Cervicalgia: Secondary | ICD-10-CM | POA: Diagnosis not present

## 2023-01-30 DIAGNOSIS — S42002D Fracture of unspecified part of left clavicle, subsequent encounter for fracture with routine healing: Secondary | ICD-10-CM | POA: Diagnosis not present

## 2023-01-30 DIAGNOSIS — G250 Essential tremor: Secondary | ICD-10-CM | POA: Diagnosis not present

## 2023-01-30 DIAGNOSIS — W19XXXA Unspecified fall, initial encounter: Secondary | ICD-10-CM | POA: Diagnosis not present

## 2023-01-30 DIAGNOSIS — M6281 Muscle weakness (generalized): Secondary | ICD-10-CM | POA: Diagnosis not present

## 2023-01-31 DIAGNOSIS — E039 Hypothyroidism, unspecified: Secondary | ICD-10-CM | POA: Diagnosis not present

## 2023-01-31 DIAGNOSIS — I10 Essential (primary) hypertension: Secondary | ICD-10-CM | POA: Diagnosis not present

## 2023-01-31 DIAGNOSIS — Z79899 Other long term (current) drug therapy: Secondary | ICD-10-CM | POA: Diagnosis not present

## 2023-01-31 DIAGNOSIS — E119 Type 2 diabetes mellitus without complications: Secondary | ICD-10-CM | POA: Diagnosis not present

## 2023-02-15 DIAGNOSIS — E118 Type 2 diabetes mellitus with unspecified complications: Secondary | ICD-10-CM | POA: Diagnosis not present

## 2023-02-15 DIAGNOSIS — K219 Gastro-esophageal reflux disease without esophagitis: Secondary | ICD-10-CM | POA: Diagnosis not present

## 2023-02-15 DIAGNOSIS — F03918 Unspecified dementia, unspecified severity, with other behavioral disturbance: Secondary | ICD-10-CM | POA: Diagnosis not present

## 2023-02-15 DIAGNOSIS — G808 Other cerebral palsy: Secondary | ICD-10-CM | POA: Diagnosis not present

## 2023-03-01 DIAGNOSIS — R531 Weakness: Secondary | ICD-10-CM | POA: Diagnosis not present

## 2023-03-01 DIAGNOSIS — Z7401 Bed confinement status: Secondary | ICD-10-CM | POA: Diagnosis not present

## 2023-03-01 DIAGNOSIS — R569 Unspecified convulsions: Secondary | ICD-10-CM | POA: Diagnosis not present

## 2023-03-01 DIAGNOSIS — I959 Hypotension, unspecified: Secondary | ICD-10-CM | POA: Diagnosis not present

## 2023-03-01 DIAGNOSIS — G4089 Other seizures: Secondary | ICD-10-CM | POA: Diagnosis not present

## 2023-03-03 DIAGNOSIS — E559 Vitamin D deficiency, unspecified: Secondary | ICD-10-CM | POA: Diagnosis not present

## 2023-03-03 DIAGNOSIS — D649 Anemia, unspecified: Secondary | ICD-10-CM | POA: Diagnosis not present

## 2023-03-03 DIAGNOSIS — E039 Hypothyroidism, unspecified: Secondary | ICD-10-CM | POA: Diagnosis not present

## 2023-03-03 DIAGNOSIS — E119 Type 2 diabetes mellitus without complications: Secondary | ICD-10-CM | POA: Diagnosis not present

## 2023-03-08 ENCOUNTER — Encounter (HOSPITAL_BASED_OUTPATIENT_CLINIC_OR_DEPARTMENT_OTHER): Payer: Self-pay

## 2023-03-13 DIAGNOSIS — W19XXXA Unspecified fall, initial encounter: Secondary | ICD-10-CM | POA: Diagnosis not present

## 2023-03-13 DIAGNOSIS — M6281 Muscle weakness (generalized): Secondary | ICD-10-CM | POA: Diagnosis not present

## 2023-03-13 DIAGNOSIS — I10 Essential (primary) hypertension: Secondary | ICD-10-CM | POA: Diagnosis not present

## 2023-03-15 DIAGNOSIS — R2689 Other abnormalities of gait and mobility: Secondary | ICD-10-CM | POA: Diagnosis not present

## 2023-03-15 DIAGNOSIS — G231 Progressive supranuclear ophthalmoplegia [Steele-Richardson-Olszewski]: Secondary | ICD-10-CM | POA: Diagnosis not present

## 2023-03-15 DIAGNOSIS — R2681 Unsteadiness on feet: Secondary | ICD-10-CM | POA: Diagnosis not present

## 2023-03-15 DIAGNOSIS — M1990 Unspecified osteoarthritis, unspecified site: Secondary | ICD-10-CM | POA: Diagnosis not present

## 2023-03-16 DIAGNOSIS — G231 Progressive supranuclear ophthalmoplegia [Steele-Richardson-Olszewski]: Secondary | ICD-10-CM | POA: Diagnosis not present

## 2023-03-16 DIAGNOSIS — R2689 Other abnormalities of gait and mobility: Secondary | ICD-10-CM | POA: Diagnosis not present

## 2023-03-16 DIAGNOSIS — M1990 Unspecified osteoarthritis, unspecified site: Secondary | ICD-10-CM | POA: Diagnosis not present

## 2023-03-16 DIAGNOSIS — R2681 Unsteadiness on feet: Secondary | ICD-10-CM | POA: Diagnosis not present

## 2023-03-17 DIAGNOSIS — R2681 Unsteadiness on feet: Secondary | ICD-10-CM | POA: Diagnosis not present

## 2023-03-17 DIAGNOSIS — M1990 Unspecified osteoarthritis, unspecified site: Secondary | ICD-10-CM | POA: Diagnosis not present

## 2023-03-17 DIAGNOSIS — G231 Progressive supranuclear ophthalmoplegia [Steele-Richardson-Olszewski]: Secondary | ICD-10-CM | POA: Diagnosis not present

## 2023-03-17 DIAGNOSIS — R2689 Other abnormalities of gait and mobility: Secondary | ICD-10-CM | POA: Diagnosis not present

## 2023-03-20 DIAGNOSIS — R2681 Unsteadiness on feet: Secondary | ICD-10-CM | POA: Diagnosis not present

## 2023-03-20 DIAGNOSIS — M1990 Unspecified osteoarthritis, unspecified site: Secondary | ICD-10-CM | POA: Diagnosis not present

## 2023-03-20 DIAGNOSIS — R2689 Other abnormalities of gait and mobility: Secondary | ICD-10-CM | POA: Diagnosis not present

## 2023-03-20 DIAGNOSIS — G231 Progressive supranuclear ophthalmoplegia [Steele-Richardson-Olszewski]: Secondary | ICD-10-CM | POA: Diagnosis not present

## 2023-03-21 DIAGNOSIS — M1990 Unspecified osteoarthritis, unspecified site: Secondary | ICD-10-CM | POA: Diagnosis not present

## 2023-03-21 DIAGNOSIS — G231 Progressive supranuclear ophthalmoplegia [Steele-Richardson-Olszewski]: Secondary | ICD-10-CM | POA: Diagnosis not present

## 2023-03-21 DIAGNOSIS — R2681 Unsteadiness on feet: Secondary | ICD-10-CM | POA: Diagnosis not present

## 2023-03-21 DIAGNOSIS — R2689 Other abnormalities of gait and mobility: Secondary | ICD-10-CM | POA: Diagnosis not present

## 2023-03-22 DIAGNOSIS — M1990 Unspecified osteoarthritis, unspecified site: Secondary | ICD-10-CM | POA: Diagnosis not present

## 2023-03-22 DIAGNOSIS — G231 Progressive supranuclear ophthalmoplegia [Steele-Richardson-Olszewski]: Secondary | ICD-10-CM | POA: Diagnosis not present

## 2023-03-22 DIAGNOSIS — R2681 Unsteadiness on feet: Secondary | ICD-10-CM | POA: Diagnosis not present

## 2023-03-22 DIAGNOSIS — R2689 Other abnormalities of gait and mobility: Secondary | ICD-10-CM | POA: Diagnosis not present

## 2023-03-24 DIAGNOSIS — R2681 Unsteadiness on feet: Secondary | ICD-10-CM | POA: Diagnosis not present

## 2023-03-24 DIAGNOSIS — R2689 Other abnormalities of gait and mobility: Secondary | ICD-10-CM | POA: Diagnosis not present

## 2023-03-24 DIAGNOSIS — G231 Progressive supranuclear ophthalmoplegia [Steele-Richardson-Olszewski]: Secondary | ICD-10-CM | POA: Diagnosis not present

## 2023-03-24 DIAGNOSIS — M1990 Unspecified osteoarthritis, unspecified site: Secondary | ICD-10-CM | POA: Diagnosis not present

## 2023-03-25 DIAGNOSIS — M1990 Unspecified osteoarthritis, unspecified site: Secondary | ICD-10-CM | POA: Diagnosis not present

## 2023-03-25 DIAGNOSIS — G231 Progressive supranuclear ophthalmoplegia [Steele-Richardson-Olszewski]: Secondary | ICD-10-CM | POA: Diagnosis not present

## 2023-03-25 DIAGNOSIS — R2689 Other abnormalities of gait and mobility: Secondary | ICD-10-CM | POA: Diagnosis not present

## 2023-03-25 DIAGNOSIS — R2681 Unsteadiness on feet: Secondary | ICD-10-CM | POA: Diagnosis not present

## 2023-03-28 DIAGNOSIS — M1990 Unspecified osteoarthritis, unspecified site: Secondary | ICD-10-CM | POA: Diagnosis not present

## 2023-03-28 DIAGNOSIS — R2681 Unsteadiness on feet: Secondary | ICD-10-CM | POA: Diagnosis not present

## 2023-03-28 DIAGNOSIS — R2689 Other abnormalities of gait and mobility: Secondary | ICD-10-CM | POA: Diagnosis not present

## 2023-03-28 DIAGNOSIS — G231 Progressive supranuclear ophthalmoplegia [Steele-Richardson-Olszewski]: Secondary | ICD-10-CM | POA: Diagnosis not present

## 2023-03-29 DIAGNOSIS — R2681 Unsteadiness on feet: Secondary | ICD-10-CM | POA: Diagnosis not present

## 2023-03-29 DIAGNOSIS — G231 Progressive supranuclear ophthalmoplegia [Steele-Richardson-Olszewski]: Secondary | ICD-10-CM | POA: Diagnosis not present

## 2023-03-29 DIAGNOSIS — R2689 Other abnormalities of gait and mobility: Secondary | ICD-10-CM | POA: Diagnosis not present

## 2023-03-29 DIAGNOSIS — M1990 Unspecified osteoarthritis, unspecified site: Secondary | ICD-10-CM | POA: Diagnosis not present

## 2023-03-30 DIAGNOSIS — R2689 Other abnormalities of gait and mobility: Secondary | ICD-10-CM | POA: Diagnosis not present

## 2023-03-30 DIAGNOSIS — R2681 Unsteadiness on feet: Secondary | ICD-10-CM | POA: Diagnosis not present

## 2023-03-30 DIAGNOSIS — G231 Progressive supranuclear ophthalmoplegia [Steele-Richardson-Olszewski]: Secondary | ICD-10-CM | POA: Diagnosis not present

## 2023-03-30 DIAGNOSIS — M1990 Unspecified osteoarthritis, unspecified site: Secondary | ICD-10-CM | POA: Diagnosis not present

## 2023-03-31 DIAGNOSIS — R2681 Unsteadiness on feet: Secondary | ICD-10-CM | POA: Diagnosis not present

## 2023-03-31 DIAGNOSIS — G231 Progressive supranuclear ophthalmoplegia [Steele-Richardson-Olszewski]: Secondary | ICD-10-CM | POA: Diagnosis not present

## 2023-03-31 DIAGNOSIS — R2689 Other abnormalities of gait and mobility: Secondary | ICD-10-CM | POA: Diagnosis not present

## 2023-03-31 DIAGNOSIS — M1990 Unspecified osteoarthritis, unspecified site: Secondary | ICD-10-CM | POA: Diagnosis not present

## 2023-04-01 DIAGNOSIS — M1990 Unspecified osteoarthritis, unspecified site: Secondary | ICD-10-CM | POA: Diagnosis not present

## 2023-04-01 DIAGNOSIS — R2681 Unsteadiness on feet: Secondary | ICD-10-CM | POA: Diagnosis not present

## 2023-04-01 DIAGNOSIS — R2689 Other abnormalities of gait and mobility: Secondary | ICD-10-CM | POA: Diagnosis not present

## 2023-04-01 DIAGNOSIS — G231 Progressive supranuclear ophthalmoplegia [Steele-Richardson-Olszewski]: Secondary | ICD-10-CM | POA: Diagnosis not present

## 2023-04-03 DIAGNOSIS — G231 Progressive supranuclear ophthalmoplegia [Steele-Richardson-Olszewski]: Secondary | ICD-10-CM | POA: Diagnosis not present

## 2023-04-03 DIAGNOSIS — R2681 Unsteadiness on feet: Secondary | ICD-10-CM | POA: Diagnosis not present

## 2023-04-03 DIAGNOSIS — R2689 Other abnormalities of gait and mobility: Secondary | ICD-10-CM | POA: Diagnosis not present

## 2023-04-03 DIAGNOSIS — M1990 Unspecified osteoarthritis, unspecified site: Secondary | ICD-10-CM | POA: Diagnosis not present

## 2023-04-05 DIAGNOSIS — R2689 Other abnormalities of gait and mobility: Secondary | ICD-10-CM | POA: Diagnosis not present

## 2023-04-05 DIAGNOSIS — G231 Progressive supranuclear ophthalmoplegia [Steele-Richardson-Olszewski]: Secondary | ICD-10-CM | POA: Diagnosis not present

## 2023-04-05 DIAGNOSIS — R2681 Unsteadiness on feet: Secondary | ICD-10-CM | POA: Diagnosis not present

## 2023-04-05 DIAGNOSIS — M1990 Unspecified osteoarthritis, unspecified site: Secondary | ICD-10-CM | POA: Diagnosis not present

## 2023-04-07 DIAGNOSIS — R2681 Unsteadiness on feet: Secondary | ICD-10-CM | POA: Diagnosis not present

## 2023-04-07 DIAGNOSIS — G231 Progressive supranuclear ophthalmoplegia [Steele-Richardson-Olszewski]: Secondary | ICD-10-CM | POA: Diagnosis not present

## 2023-04-07 DIAGNOSIS — M1990 Unspecified osteoarthritis, unspecified site: Secondary | ICD-10-CM | POA: Diagnosis not present

## 2023-04-07 DIAGNOSIS — R2689 Other abnormalities of gait and mobility: Secondary | ICD-10-CM | POA: Diagnosis not present

## 2023-04-08 DIAGNOSIS — R2681 Unsteadiness on feet: Secondary | ICD-10-CM | POA: Diagnosis not present

## 2023-04-08 DIAGNOSIS — G231 Progressive supranuclear ophthalmoplegia [Steele-Richardson-Olszewski]: Secondary | ICD-10-CM | POA: Diagnosis not present

## 2023-04-08 DIAGNOSIS — R2689 Other abnormalities of gait and mobility: Secondary | ICD-10-CM | POA: Diagnosis not present

## 2023-04-08 DIAGNOSIS — M1990 Unspecified osteoarthritis, unspecified site: Secondary | ICD-10-CM | POA: Diagnosis not present

## 2023-04-10 DIAGNOSIS — R2689 Other abnormalities of gait and mobility: Secondary | ICD-10-CM | POA: Diagnosis not present

## 2023-04-10 DIAGNOSIS — R2681 Unsteadiness on feet: Secondary | ICD-10-CM | POA: Diagnosis not present

## 2023-04-10 DIAGNOSIS — G231 Progressive supranuclear ophthalmoplegia [Steele-Richardson-Olszewski]: Secondary | ICD-10-CM | POA: Diagnosis not present

## 2023-04-10 DIAGNOSIS — M1990 Unspecified osteoarthritis, unspecified site: Secondary | ICD-10-CM | POA: Diagnosis not present

## 2023-04-12 DIAGNOSIS — F03918 Unspecified dementia, unspecified severity, with other behavioral disturbance: Secondary | ICD-10-CM | POA: Diagnosis not present

## 2023-04-12 DIAGNOSIS — G4700 Insomnia, unspecified: Secondary | ICD-10-CM | POA: Diagnosis not present

## 2023-04-17 DIAGNOSIS — R41841 Cognitive communication deficit: Secondary | ICD-10-CM | POA: Diagnosis not present

## 2023-04-19 DIAGNOSIS — M6281 Muscle weakness (generalized): Secondary | ICD-10-CM | POA: Diagnosis not present

## 2023-04-19 DIAGNOSIS — S065XAD Traumatic subdural hemorrhage with loss of consciousness status unknown, subsequent encounter: Secondary | ICD-10-CM | POA: Diagnosis not present

## 2023-04-20 DIAGNOSIS — M6281 Muscle weakness (generalized): Secondary | ICD-10-CM | POA: Diagnosis not present

## 2023-04-20 DIAGNOSIS — S065XAD Traumatic subdural hemorrhage with loss of consciousness status unknown, subsequent encounter: Secondary | ICD-10-CM | POA: Diagnosis not present

## 2023-04-21 DIAGNOSIS — M6281 Muscle weakness (generalized): Secondary | ICD-10-CM | POA: Diagnosis not present

## 2023-04-21 DIAGNOSIS — S065XAD Traumatic subdural hemorrhage with loss of consciousness status unknown, subsequent encounter: Secondary | ICD-10-CM | POA: Diagnosis not present

## 2023-04-24 DIAGNOSIS — M6281 Muscle weakness (generalized): Secondary | ICD-10-CM | POA: Diagnosis not present

## 2023-04-24 DIAGNOSIS — S065XAD Traumatic subdural hemorrhage with loss of consciousness status unknown, subsequent encounter: Secondary | ICD-10-CM | POA: Diagnosis not present

## 2023-04-25 DIAGNOSIS — E118 Type 2 diabetes mellitus with unspecified complications: Secondary | ICD-10-CM | POA: Diagnosis not present

## 2023-04-25 DIAGNOSIS — F03918 Unspecified dementia, unspecified severity, with other behavioral disturbance: Secondary | ICD-10-CM | POA: Diagnosis not present

## 2023-04-25 DIAGNOSIS — G4700 Insomnia, unspecified: Secondary | ICD-10-CM | POA: Diagnosis not present

## 2023-04-25 DIAGNOSIS — G250 Essential tremor: Secondary | ICD-10-CM | POA: Diagnosis not present

## 2023-04-25 DIAGNOSIS — M6281 Muscle weakness (generalized): Secondary | ICD-10-CM | POA: Diagnosis not present

## 2023-04-25 DIAGNOSIS — S065XAD Traumatic subdural hemorrhage with loss of consciousness status unknown, subsequent encounter: Secondary | ICD-10-CM | POA: Diagnosis not present

## 2023-04-26 DIAGNOSIS — M6281 Muscle weakness (generalized): Secondary | ICD-10-CM | POA: Diagnosis not present

## 2023-04-26 DIAGNOSIS — M1990 Unspecified osteoarthritis, unspecified site: Secondary | ICD-10-CM | POA: Diagnosis not present

## 2023-04-26 DIAGNOSIS — R2689 Other abnormalities of gait and mobility: Secondary | ICD-10-CM | POA: Diagnosis not present

## 2023-04-26 DIAGNOSIS — S065XAD Traumatic subdural hemorrhage with loss of consciousness status unknown, subsequent encounter: Secondary | ICD-10-CM | POA: Diagnosis not present

## 2023-04-26 DIAGNOSIS — G231 Progressive supranuclear ophthalmoplegia [Steele-Richardson-Olszewski]: Secondary | ICD-10-CM | POA: Diagnosis not present

## 2023-04-27 DIAGNOSIS — R2689 Other abnormalities of gait and mobility: Secondary | ICD-10-CM | POA: Diagnosis not present

## 2023-04-27 DIAGNOSIS — G231 Progressive supranuclear ophthalmoplegia [Steele-Richardson-Olszewski]: Secondary | ICD-10-CM | POA: Diagnosis not present

## 2023-04-27 DIAGNOSIS — M1990 Unspecified osteoarthritis, unspecified site: Secondary | ICD-10-CM | POA: Diagnosis not present

## 2023-04-27 DIAGNOSIS — S065XAD Traumatic subdural hemorrhage with loss of consciousness status unknown, subsequent encounter: Secondary | ICD-10-CM | POA: Diagnosis not present

## 2023-04-27 DIAGNOSIS — M6281 Muscle weakness (generalized): Secondary | ICD-10-CM | POA: Diagnosis not present

## 2023-04-28 DIAGNOSIS — F03918 Unspecified dementia, unspecified severity, with other behavioral disturbance: Secondary | ICD-10-CM | POA: Diagnosis not present

## 2023-04-28 DIAGNOSIS — G231 Progressive supranuclear ophthalmoplegia [Steele-Richardson-Olszewski]: Secondary | ICD-10-CM | POA: Diagnosis not present

## 2023-04-28 DIAGNOSIS — R2689 Other abnormalities of gait and mobility: Secondary | ICD-10-CM | POA: Diagnosis not present

## 2023-04-28 DIAGNOSIS — S065XAD Traumatic subdural hemorrhage with loss of consciousness status unknown, subsequent encounter: Secondary | ICD-10-CM | POA: Diagnosis not present

## 2023-04-28 DIAGNOSIS — M6281 Muscle weakness (generalized): Secondary | ICD-10-CM | POA: Diagnosis not present

## 2023-04-28 DIAGNOSIS — R296 Repeated falls: Secondary | ICD-10-CM | POA: Diagnosis not present

## 2023-04-28 DIAGNOSIS — M1990 Unspecified osteoarthritis, unspecified site: Secondary | ICD-10-CM | POA: Diagnosis not present

## 2023-05-01 DIAGNOSIS — M6281 Muscle weakness (generalized): Secondary | ICD-10-CM | POA: Diagnosis not present

## 2023-05-01 DIAGNOSIS — S065XAD Traumatic subdural hemorrhage with loss of consciousness status unknown, subsequent encounter: Secondary | ICD-10-CM | POA: Diagnosis not present

## 2023-05-01 DIAGNOSIS — R2689 Other abnormalities of gait and mobility: Secondary | ICD-10-CM | POA: Diagnosis not present

## 2023-05-01 DIAGNOSIS — G231 Progressive supranuclear ophthalmoplegia [Steele-Richardson-Olszewski]: Secondary | ICD-10-CM | POA: Diagnosis not present

## 2023-05-01 DIAGNOSIS — M1990 Unspecified osteoarthritis, unspecified site: Secondary | ICD-10-CM | POA: Diagnosis not present

## 2023-05-02 DIAGNOSIS — M1990 Unspecified osteoarthritis, unspecified site: Secondary | ICD-10-CM | POA: Diagnosis not present

## 2023-05-02 DIAGNOSIS — M6281 Muscle weakness (generalized): Secondary | ICD-10-CM | POA: Diagnosis not present

## 2023-05-02 DIAGNOSIS — S065XAD Traumatic subdural hemorrhage with loss of consciousness status unknown, subsequent encounter: Secondary | ICD-10-CM | POA: Diagnosis not present

## 2023-05-02 DIAGNOSIS — G231 Progressive supranuclear ophthalmoplegia [Steele-Richardson-Olszewski]: Secondary | ICD-10-CM | POA: Diagnosis not present

## 2023-05-02 DIAGNOSIS — R2689 Other abnormalities of gait and mobility: Secondary | ICD-10-CM | POA: Diagnosis not present

## 2023-05-03 DIAGNOSIS — M1990 Unspecified osteoarthritis, unspecified site: Secondary | ICD-10-CM | POA: Diagnosis not present

## 2023-05-03 DIAGNOSIS — S065XAD Traumatic subdural hemorrhage with loss of consciousness status unknown, subsequent encounter: Secondary | ICD-10-CM | POA: Diagnosis not present

## 2023-05-03 DIAGNOSIS — F03918 Unspecified dementia, unspecified severity, with other behavioral disturbance: Secondary | ICD-10-CM | POA: Diagnosis not present

## 2023-05-03 DIAGNOSIS — M6281 Muscle weakness (generalized): Secondary | ICD-10-CM | POA: Diagnosis not present

## 2023-05-03 DIAGNOSIS — R296 Repeated falls: Secondary | ICD-10-CM | POA: Diagnosis not present

## 2023-05-03 DIAGNOSIS — G231 Progressive supranuclear ophthalmoplegia [Steele-Richardson-Olszewski]: Secondary | ICD-10-CM | POA: Diagnosis not present

## 2023-05-03 DIAGNOSIS — R2689 Other abnormalities of gait and mobility: Secondary | ICD-10-CM | POA: Diagnosis not present

## 2023-05-04 DIAGNOSIS — S065XAD Traumatic subdural hemorrhage with loss of consciousness status unknown, subsequent encounter: Secondary | ICD-10-CM | POA: Diagnosis not present

## 2023-05-04 DIAGNOSIS — M1990 Unspecified osteoarthritis, unspecified site: Secondary | ICD-10-CM | POA: Diagnosis not present

## 2023-05-04 DIAGNOSIS — G231 Progressive supranuclear ophthalmoplegia [Steele-Richardson-Olszewski]: Secondary | ICD-10-CM | POA: Diagnosis not present

## 2023-05-04 DIAGNOSIS — M6281 Muscle weakness (generalized): Secondary | ICD-10-CM | POA: Diagnosis not present

## 2023-05-04 DIAGNOSIS — R2689 Other abnormalities of gait and mobility: Secondary | ICD-10-CM | POA: Diagnosis not present

## 2023-05-05 DIAGNOSIS — M6281 Muscle weakness (generalized): Secondary | ICD-10-CM | POA: Diagnosis not present

## 2023-05-05 DIAGNOSIS — M1990 Unspecified osteoarthritis, unspecified site: Secondary | ICD-10-CM | POA: Diagnosis not present

## 2023-05-05 DIAGNOSIS — S065XAD Traumatic subdural hemorrhage with loss of consciousness status unknown, subsequent encounter: Secondary | ICD-10-CM | POA: Diagnosis not present

## 2023-05-05 DIAGNOSIS — G231 Progressive supranuclear ophthalmoplegia [Steele-Richardson-Olszewski]: Secondary | ICD-10-CM | POA: Diagnosis not present

## 2023-05-05 DIAGNOSIS — R2689 Other abnormalities of gait and mobility: Secondary | ICD-10-CM | POA: Diagnosis not present

## 2023-05-08 DIAGNOSIS — R451 Restlessness and agitation: Secondary | ICD-10-CM | POA: Diagnosis not present

## 2023-05-08 DIAGNOSIS — G231 Progressive supranuclear ophthalmoplegia [Steele-Richardson-Olszewski]: Secondary | ICD-10-CM | POA: Diagnosis not present

## 2023-05-08 DIAGNOSIS — R2689 Other abnormalities of gait and mobility: Secondary | ICD-10-CM | POA: Diagnosis not present

## 2023-05-08 DIAGNOSIS — M6281 Muscle weakness (generalized): Secondary | ICD-10-CM | POA: Diagnosis not present

## 2023-05-08 DIAGNOSIS — M1990 Unspecified osteoarthritis, unspecified site: Secondary | ICD-10-CM | POA: Diagnosis not present

## 2023-05-08 DIAGNOSIS — E559 Vitamin D deficiency, unspecified: Secondary | ICD-10-CM | POA: Diagnosis not present

## 2023-05-08 DIAGNOSIS — R296 Repeated falls: Secondary | ICD-10-CM | POA: Diagnosis not present

## 2023-05-08 DIAGNOSIS — S065XAD Traumatic subdural hemorrhage with loss of consciousness status unknown, subsequent encounter: Secondary | ICD-10-CM | POA: Diagnosis not present

## 2023-05-08 DIAGNOSIS — G250 Essential tremor: Secondary | ICD-10-CM | POA: Diagnosis not present

## 2023-05-09 DIAGNOSIS — R2689 Other abnormalities of gait and mobility: Secondary | ICD-10-CM | POA: Diagnosis not present

## 2023-05-09 DIAGNOSIS — G231 Progressive supranuclear ophthalmoplegia [Steele-Richardson-Olszewski]: Secondary | ICD-10-CM | POA: Diagnosis not present

## 2023-05-09 DIAGNOSIS — S065XAD Traumatic subdural hemorrhage with loss of consciousness status unknown, subsequent encounter: Secondary | ICD-10-CM | POA: Diagnosis not present

## 2023-05-09 DIAGNOSIS — M1990 Unspecified osteoarthritis, unspecified site: Secondary | ICD-10-CM | POA: Diagnosis not present

## 2023-05-09 DIAGNOSIS — M6281 Muscle weakness (generalized): Secondary | ICD-10-CM | POA: Diagnosis not present

## 2023-05-11 DIAGNOSIS — I10 Essential (primary) hypertension: Secondary | ICD-10-CM | POA: Diagnosis not present

## 2023-05-11 DIAGNOSIS — E118 Type 2 diabetes mellitus with unspecified complications: Secondary | ICD-10-CM | POA: Diagnosis not present

## 2023-05-11 DIAGNOSIS — E119 Type 2 diabetes mellitus without complications: Secondary | ICD-10-CM | POA: Diagnosis not present

## 2023-05-16 DIAGNOSIS — F332 Major depressive disorder, recurrent severe without psychotic features: Secondary | ICD-10-CM | POA: Diagnosis not present

## 2023-05-16 DIAGNOSIS — F5101 Primary insomnia: Secondary | ICD-10-CM | POA: Diagnosis not present

## 2023-05-16 DIAGNOSIS — F0394 Unspecified dementia, unspecified severity, with anxiety: Secondary | ICD-10-CM | POA: Diagnosis not present

## 2023-05-18 DIAGNOSIS — F0394 Unspecified dementia, unspecified severity, with anxiety: Secondary | ICD-10-CM | POA: Diagnosis not present

## 2023-05-18 DIAGNOSIS — F5101 Primary insomnia: Secondary | ICD-10-CM | POA: Diagnosis not present

## 2023-05-18 DIAGNOSIS — F331 Major depressive disorder, recurrent, moderate: Secondary | ICD-10-CM | POA: Diagnosis not present

## 2023-05-30 DIAGNOSIS — F332 Major depressive disorder, recurrent severe without psychotic features: Secondary | ICD-10-CM | POA: Diagnosis not present

## 2023-06-08 DIAGNOSIS — R635 Abnormal weight gain: Secondary | ICD-10-CM | POA: Diagnosis not present

## 2023-06-08 DIAGNOSIS — R451 Restlessness and agitation: Secondary | ICD-10-CM | POA: Diagnosis not present

## 2023-06-08 DIAGNOSIS — G4700 Insomnia, unspecified: Secondary | ICD-10-CM | POA: Diagnosis not present

## 2023-06-08 DIAGNOSIS — F03918 Unspecified dementia, unspecified severity, with other behavioral disturbance: Secondary | ICD-10-CM | POA: Diagnosis not present

## 2023-06-09 DIAGNOSIS — F03918 Unspecified dementia, unspecified severity, with other behavioral disturbance: Secondary | ICD-10-CM | POA: Diagnosis not present

## 2023-06-09 DIAGNOSIS — F32A Depression, unspecified: Secondary | ICD-10-CM | POA: Diagnosis not present

## 2023-06-12 DIAGNOSIS — E559 Vitamin D deficiency, unspecified: Secondary | ICD-10-CM | POA: Diagnosis not present

## 2023-06-12 DIAGNOSIS — F32A Depression, unspecified: Secondary | ICD-10-CM | POA: Diagnosis not present

## 2023-06-12 DIAGNOSIS — F01511 Vascular dementia, unspecified severity, with agitation: Secondary | ICD-10-CM | POA: Diagnosis not present

## 2023-06-12 DIAGNOSIS — E440 Moderate protein-calorie malnutrition: Secondary | ICD-10-CM | POA: Diagnosis not present

## 2023-06-13 DIAGNOSIS — F03918 Unspecified dementia, unspecified severity, with other behavioral disturbance: Secondary | ICD-10-CM | POA: Diagnosis not present

## 2023-06-13 DIAGNOSIS — R635 Abnormal weight gain: Secondary | ICD-10-CM | POA: Diagnosis not present

## 2023-06-13 DIAGNOSIS — F332 Major depressive disorder, recurrent severe without psychotic features: Secondary | ICD-10-CM | POA: Diagnosis not present

## 2023-06-13 DIAGNOSIS — I10 Essential (primary) hypertension: Secondary | ICD-10-CM | POA: Diagnosis not present

## 2023-06-13 DIAGNOSIS — G4700 Insomnia, unspecified: Secondary | ICD-10-CM | POA: Diagnosis not present

## 2023-06-13 DIAGNOSIS — R451 Restlessness and agitation: Secondary | ICD-10-CM | POA: Diagnosis not present

## 2023-06-13 DIAGNOSIS — E559 Vitamin D deficiency, unspecified: Secondary | ICD-10-CM | POA: Diagnosis not present

## 2023-06-14 DIAGNOSIS — M6281 Muscle weakness (generalized): Secondary | ICD-10-CM | POA: Diagnosis not present

## 2023-06-14 DIAGNOSIS — S42122A Displaced fracture of acromial process, left shoulder, initial encounter for closed fracture: Secondary | ICD-10-CM | POA: Diagnosis not present

## 2023-06-15 DIAGNOSIS — F0394 Unspecified dementia, unspecified severity, with anxiety: Secondary | ICD-10-CM | POA: Diagnosis not present

## 2023-06-15 DIAGNOSIS — S42122A Displaced fracture of acromial process, left shoulder, initial encounter for closed fracture: Secondary | ICD-10-CM | POA: Diagnosis not present

## 2023-06-15 DIAGNOSIS — M6281 Muscle weakness (generalized): Secondary | ICD-10-CM | POA: Diagnosis not present

## 2023-06-15 DIAGNOSIS — F331 Major depressive disorder, recurrent, moderate: Secondary | ICD-10-CM | POA: Diagnosis not present

## 2023-06-15 DIAGNOSIS — F5101 Primary insomnia: Secondary | ICD-10-CM | POA: Diagnosis not present

## 2023-06-16 DIAGNOSIS — M6281 Muscle weakness (generalized): Secondary | ICD-10-CM | POA: Diagnosis not present

## 2023-06-16 DIAGNOSIS — S42122A Displaced fracture of acromial process, left shoulder, initial encounter for closed fracture: Secondary | ICD-10-CM | POA: Diagnosis not present

## 2023-06-19 DIAGNOSIS — M6281 Muscle weakness (generalized): Secondary | ICD-10-CM | POA: Diagnosis not present

## 2023-06-19 DIAGNOSIS — S42122A Displaced fracture of acromial process, left shoulder, initial encounter for closed fracture: Secondary | ICD-10-CM | POA: Diagnosis not present

## 2023-06-20 DIAGNOSIS — S42122A Displaced fracture of acromial process, left shoulder, initial encounter for closed fracture: Secondary | ICD-10-CM | POA: Diagnosis not present

## 2023-06-20 DIAGNOSIS — M6281 Muscle weakness (generalized): Secondary | ICD-10-CM | POA: Diagnosis not present

## 2023-06-21 DIAGNOSIS — S42122A Displaced fracture of acromial process, left shoulder, initial encounter for closed fracture: Secondary | ICD-10-CM | POA: Diagnosis not present

## 2023-06-21 DIAGNOSIS — M6281 Muscle weakness (generalized): Secondary | ICD-10-CM | POA: Diagnosis not present

## 2023-06-22 DIAGNOSIS — M6281 Muscle weakness (generalized): Secondary | ICD-10-CM | POA: Diagnosis not present

## 2023-06-22 DIAGNOSIS — S42122A Displaced fracture of acromial process, left shoulder, initial encounter for closed fracture: Secondary | ICD-10-CM | POA: Diagnosis not present

## 2023-06-23 DIAGNOSIS — S42122A Displaced fracture of acromial process, left shoulder, initial encounter for closed fracture: Secondary | ICD-10-CM | POA: Diagnosis not present

## 2023-06-23 DIAGNOSIS — M6281 Muscle weakness (generalized): Secondary | ICD-10-CM | POA: Diagnosis not present

## 2023-06-26 DIAGNOSIS — M6281 Muscle weakness (generalized): Secondary | ICD-10-CM | POA: Diagnosis not present

## 2023-06-26 DIAGNOSIS — S42122A Displaced fracture of acromial process, left shoulder, initial encounter for closed fracture: Secondary | ICD-10-CM | POA: Diagnosis not present

## 2023-06-27 DIAGNOSIS — S42122A Displaced fracture of acromial process, left shoulder, initial encounter for closed fracture: Secondary | ICD-10-CM | POA: Diagnosis not present

## 2023-06-27 DIAGNOSIS — M6281 Muscle weakness (generalized): Secondary | ICD-10-CM | POA: Diagnosis not present

## 2023-06-27 DIAGNOSIS — F332 Major depressive disorder, recurrent severe without psychotic features: Secondary | ICD-10-CM | POA: Diagnosis not present

## 2023-06-28 DIAGNOSIS — S42122A Displaced fracture of acromial process, left shoulder, initial encounter for closed fracture: Secondary | ICD-10-CM | POA: Diagnosis not present

## 2023-06-28 DIAGNOSIS — M6281 Muscle weakness (generalized): Secondary | ICD-10-CM | POA: Diagnosis not present

## 2023-06-29 DIAGNOSIS — S42122A Displaced fracture of acromial process, left shoulder, initial encounter for closed fracture: Secondary | ICD-10-CM | POA: Diagnosis not present

## 2023-06-29 DIAGNOSIS — M6281 Muscle weakness (generalized): Secondary | ICD-10-CM | POA: Diagnosis not present

## 2023-06-30 DIAGNOSIS — M6281 Muscle weakness (generalized): Secondary | ICD-10-CM | POA: Diagnosis not present

## 2023-06-30 DIAGNOSIS — S42122A Displaced fracture of acromial process, left shoulder, initial encounter for closed fracture: Secondary | ICD-10-CM | POA: Diagnosis not present

## 2023-07-02 DIAGNOSIS — S42122A Displaced fracture of acromial process, left shoulder, initial encounter for closed fracture: Secondary | ICD-10-CM | POA: Diagnosis not present

## 2023-07-02 DIAGNOSIS — M6281 Muscle weakness (generalized): Secondary | ICD-10-CM | POA: Diagnosis not present

## 2023-07-03 DIAGNOSIS — S42122A Displaced fracture of acromial process, left shoulder, initial encounter for closed fracture: Secondary | ICD-10-CM | POA: Diagnosis not present

## 2023-07-03 DIAGNOSIS — M6281 Muscle weakness (generalized): Secondary | ICD-10-CM | POA: Diagnosis not present

## 2023-07-11 DIAGNOSIS — F332 Major depressive disorder, recurrent severe without psychotic features: Secondary | ICD-10-CM | POA: Diagnosis not present

## 2023-07-13 DIAGNOSIS — F5101 Primary insomnia: Secondary | ICD-10-CM | POA: Diagnosis not present

## 2023-07-13 DIAGNOSIS — F0394 Unspecified dementia, unspecified severity, with anxiety: Secondary | ICD-10-CM | POA: Diagnosis not present

## 2023-07-13 DIAGNOSIS — F331 Major depressive disorder, recurrent, moderate: Secondary | ICD-10-CM | POA: Diagnosis not present

## 2023-07-20 DIAGNOSIS — E119 Type 2 diabetes mellitus without complications: Secondary | ICD-10-CM | POA: Diagnosis not present

## 2023-07-21 DIAGNOSIS — R296 Repeated falls: Secondary | ICD-10-CM | POA: Diagnosis not present

## 2023-07-23 DIAGNOSIS — R2681 Unsteadiness on feet: Secondary | ICD-10-CM | POA: Diagnosis not present

## 2023-07-23 DIAGNOSIS — R419 Unspecified symptoms and signs involving cognitive functions and awareness: Secondary | ICD-10-CM | POA: Diagnosis not present

## 2023-07-23 DIAGNOSIS — M6281 Muscle weakness (generalized): Secondary | ICD-10-CM | POA: Diagnosis not present

## 2023-07-23 DIAGNOSIS — R2689 Other abnormalities of gait and mobility: Secondary | ICD-10-CM | POA: Diagnosis not present

## 2023-07-24 DIAGNOSIS — R2689 Other abnormalities of gait and mobility: Secondary | ICD-10-CM | POA: Diagnosis not present

## 2023-07-24 DIAGNOSIS — R419 Unspecified symptoms and signs involving cognitive functions and awareness: Secondary | ICD-10-CM | POA: Diagnosis not present

## 2023-07-24 DIAGNOSIS — R2681 Unsteadiness on feet: Secondary | ICD-10-CM | POA: Diagnosis not present

## 2023-07-24 DIAGNOSIS — M6281 Muscle weakness (generalized): Secondary | ICD-10-CM | POA: Diagnosis not present

## 2023-07-25 DIAGNOSIS — M6281 Muscle weakness (generalized): Secondary | ICD-10-CM | POA: Diagnosis not present

## 2023-07-25 DIAGNOSIS — R419 Unspecified symptoms and signs involving cognitive functions and awareness: Secondary | ICD-10-CM | POA: Diagnosis not present

## 2023-07-25 DIAGNOSIS — R2689 Other abnormalities of gait and mobility: Secondary | ICD-10-CM | POA: Diagnosis not present

## 2023-07-25 DIAGNOSIS — R2681 Unsteadiness on feet: Secondary | ICD-10-CM | POA: Diagnosis not present

## 2023-07-26 DIAGNOSIS — R2689 Other abnormalities of gait and mobility: Secondary | ICD-10-CM | POA: Diagnosis not present

## 2023-07-26 DIAGNOSIS — R419 Unspecified symptoms and signs involving cognitive functions and awareness: Secondary | ICD-10-CM | POA: Diagnosis not present

## 2023-07-26 DIAGNOSIS — M6281 Muscle weakness (generalized): Secondary | ICD-10-CM | POA: Diagnosis not present

## 2023-07-26 DIAGNOSIS — R2681 Unsteadiness on feet: Secondary | ICD-10-CM | POA: Diagnosis not present

## 2023-07-27 DIAGNOSIS — R419 Unspecified symptoms and signs involving cognitive functions and awareness: Secondary | ICD-10-CM | POA: Diagnosis not present

## 2023-07-27 DIAGNOSIS — R2681 Unsteadiness on feet: Secondary | ICD-10-CM | POA: Diagnosis not present

## 2023-07-27 DIAGNOSIS — R2689 Other abnormalities of gait and mobility: Secondary | ICD-10-CM | POA: Diagnosis not present

## 2023-07-27 DIAGNOSIS — M6281 Muscle weakness (generalized): Secondary | ICD-10-CM | POA: Diagnosis not present

## 2023-07-30 ENCOUNTER — Encounter (HOSPITAL_COMMUNITY): Payer: Self-pay

## 2023-07-30 ENCOUNTER — Other Ambulatory Visit: Payer: Self-pay

## 2023-07-30 ENCOUNTER — Emergency Department (HOSPITAL_COMMUNITY)
Admission: EM | Admit: 2023-07-30 | Discharge: 2023-07-30 | Disposition: A | Discharge status: Home / Self Care | Payer: Medicare HMO | Attending: Emergency Medicine | Admitting: Emergency Medicine

## 2023-07-30 DIAGNOSIS — F0390 Unspecified dementia without behavioral disturbance: Secondary | ICD-10-CM | POA: Diagnosis present

## 2023-07-30 DIAGNOSIS — R45851 Suicidal ideations: Secondary | ICD-10-CM | POA: Insufficient documentation

## 2023-07-30 DIAGNOSIS — Z7401 Bed confinement status: Secondary | ICD-10-CM | POA: Diagnosis not present

## 2023-07-30 DIAGNOSIS — R531 Weakness: Secondary | ICD-10-CM | POA: Diagnosis not present

## 2023-07-30 DIAGNOSIS — R001 Bradycardia, unspecified: Secondary | ICD-10-CM | POA: Diagnosis not present

## 2023-07-30 DIAGNOSIS — I959 Hypotension, unspecified: Secondary | ICD-10-CM | POA: Diagnosis not present

## 2023-07-30 LAB — URINALYSIS, ROUTINE W REFLEX MICROSCOPIC
Bilirubin Urine: NEGATIVE
Glucose, UA: NEGATIVE mg/dL
Hgb urine dipstick: NEGATIVE
Ketones, ur: NEGATIVE mg/dL
Leukocytes,Ua: NEGATIVE
Nitrite: NEGATIVE
Protein, ur: NEGATIVE mg/dL
Specific Gravity, Urine: 1.019 (ref 1.005–1.030)
pH: 5 (ref 5.0–8.0)

## 2023-07-30 LAB — COMPREHENSIVE METABOLIC PANEL
ALT: 15 U/L (ref 0–44)
AST: 18 U/L (ref 15–41)
Albumin: 3.3 g/dL — ABNORMAL LOW (ref 3.5–5.0)
Alkaline Phosphatase: 54 U/L (ref 38–126)
Anion gap: 11 (ref 5–15)
BUN: 22 mg/dL (ref 8–23)
CO2: 27 mmol/L (ref 22–32)
Calcium: 9 mg/dL (ref 8.9–10.3)
Chloride: 102 mmol/L (ref 98–111)
Creatinine, Ser: 0.95 mg/dL (ref 0.61–1.24)
GFR, Estimated: >60 mL/min (ref >60)
Glucose, Bld: 173 mg/dL — ABNORMAL HIGH (ref 70–99)
Potassium: 4.2 mmol/L (ref 3.5–5.1)
Sodium: 140 mmol/L (ref 135–145)
Total Bilirubin: 0.4 mg/dL (ref 0.3–1.2)
Total Protein: 6 g/dL — ABNORMAL LOW (ref 6.5–8.1)

## 2023-07-30 LAB — CBC WITH DIFFERENTIAL/PLATELET
Abs Immature Granulocytes: 0.02 10*3/uL (ref 0.00–0.07)
Basophils Absolute: 0 10*3/uL (ref 0.0–0.1)
Basophils Relative: 0 %
Eosinophils Absolute: 0.2 10*3/uL (ref 0.0–0.5)
Eosinophils Relative: 3 %
HCT: 41.4 % (ref 39.0–52.0)
Hemoglobin: 14.1 g/dL (ref 13.0–17.0)
Immature Granulocytes: 0 %
Lymphocytes Relative: 19 %
Lymphs Abs: 1.5 10*3/uL (ref 0.7–4.0)
MCH: 29.4 pg (ref 26.0–34.0)
MCHC: 34.1 g/dL (ref 30.0–36.0)
MCV: 86.4 fL (ref 80.0–100.0)
Monocytes Absolute: 0.5 10*3/uL (ref 0.1–1.0)
Monocytes Relative: 6 %
Neutro Abs: 5.4 10*3/uL (ref 1.7–7.7)
Neutrophils Relative %: 72 %
Platelets: 200 10*3/uL (ref 150–400)
RBC: 4.79 MIL/uL (ref 4.22–5.81)
RDW: 12.7 % (ref 11.5–15.5)
WBC: 7.5 10*3/uL (ref 4.0–10.5)
nRBC: 0 % (ref 0.0–0.2)

## 2023-07-30 LAB — RAPID URINE DRUG SCREEN, HOSP PERFORMED
Amphetamines: NOT DETECTED
Barbiturates: NOT DETECTED
Benzodiazepines: NOT DETECTED
Cocaine: NOT DETECTED
Opiates: NOT DETECTED
Tetrahydrocannabinol: NOT DETECTED

## 2023-07-30 LAB — ETHANOL: Alcohol, Ethyl (B): <10 mg/dL (ref <10)

## 2023-07-30 MED ORDER — MIRTAZAPINE 7.5 MG PO TABS: 7.5000 mg | ORAL_TABLET | Freq: Every day | ORAL | 0 refills | Status: AC

## 2023-07-30 NOTE — ED Provider Notes (Signed)
[place] EMERGENCY DEPARTMENT AT [facility] Provider Note   CSN: [id number] Arrival date & time: 07/30/23  0915     History Chief Complaint  Patient presents with   Suicidal    Brandon Vargas is a 67 y.o. male with history of neurocognitive disorder, dementia, progressive supranuclear palsy, recurrent falls which is why he is at rehab/SNF presents emergency room today after suicidal ideations.  The patient has not been consistent with his suicidal ideations.  He told nursing staff he was not feeling suicidal however when I asked about it, he reports that he was feeling suicidal because he does have the ability in his bed all day.  I called his facility and spoke with Eber Jones, LPN with [facility].  She reports that her history was from what she received from the night shift nurses.  She reports that she was told that his bed was raised all the way to the top and that he said he was going to roll off in order to kill himself.  Eber Jones reports that he is at his baseline.  He has not had any of these discussions before.  She reports that he does usually raise his bed all the way to the top though.  She reports that he was acting at his normal baseline when she saw him in the morning before he was sent over to the emergency department.  Denies any recent falls.  Currently, the patient has no other complaints.  He does appear a little confused however is alert.  HPI     Home Medications Prior to Admission medications   Medication Sig Start Date End Date Taking? Authorizing Provider  ACCU-CHEK GUIDE test strip  05/15/20   [provider]  acetaminophen (TYLENOL) 500 MG tablet Take 500 mg by mouth every 6 (six) hours as needed for mild pain.    [provider]  Cholecalciferol (VITAMIN D3) 25 MCG (1000 UT) CAPS Take 1 capsule (1,000 Units total) by mouth daily. 12/03/21   [place], Buren Kos, MD  cyclobenzaprine (FLEXERIL) 5 MG tablet Take 1 tablet (5 mg  total) by mouth at bedtime as needed for muscle spasms. Patient taking differently: Take 5 mg by mouth daily as needed for muscle spasms. 02/01/22   [place], Buren Kos, MD  diclofenac Sodium (VOLTAREN) 1 % GEL Apply 2 g topically 4 (four) times daily. To affected joint. Patient not taking: Reported on 05/14/2022 05/05/22   [place], Buren Kos, MD  donepezil (ARICEPT) 10 MG tablet Take 1 tablet (10 mg total) by mouth daily. 12/06/21   [place], Buren Kos, MD  Menthol, Topical Analgesic, (BIOFREEZE EX) Apply 1 application. topically daily as needed (For neck pain).    [provider]  metFORMIN (GLUCOPHAGE) 500 MG tablet Take 1 tablet (500 mg total) by mouth daily with breakfast for 14 days, THEN 1 tablet (500 mg total) 2 (two) times daily with a meal. 05/18/22 08/16/22  Almon Hercules, MD  mirtazapine (REMERON) 7.5 MG tablet Take 7.5 mg by mouth at bedtime. 09/02/21   [provider]  pantoprazole (PROTONIX) 40 MG tablet Take 1 tablet (40 mg total) by mouth daily. 02/22/22   [place], Buren Kos, MD  tamsulosin (FLOMAX) 0.4 MG CAPS capsule Take 0.4 mg by mouth daily.    [provider]      Allergies    Metformin and related    Review of Systems   Review of Systems  Unable to perform ROS: Dementia    Physical Exam Updated Vital Signs BP (!) 112/57   Pulse (!) 58   Temp 98.2 F (36.8 C) (Oral)   Resp 16   SpO2 100%  Physical Exam Vitals and nursing note reviewed.  Constitutional:      General: He is not in acute distress.    Appearance: He is not toxic-appearing.     Comments: Slow to respond, but does use appropriate speech.  Eyes:     General: No scleral icterus. Neck:     Comments: Faint surgical scar seen to the posterior neck. Pulmonary:     Effort: Pulmonary effort is normal. No respiratory distress.  Abdominal:     Tenderness: There is no abdominal tenderness. There is no guarding or rebound.  Musculoskeletal:     Cervical back: Normal range of motion.   Skin:    General: Skin is warm and dry.  Neurological:     Mental Status: He is alert. Mental status is at baseline.     Comments: Moving all extremities.  Follows commands.  He appears to be mildly confused.  He initially thought it was September in 2024 but knew his name and where he was.  He also knew why he was here.  I do not appreciate any facial droop.  At his baseline per nursing facility.  Psychiatric:        Thought Content: Thought content includes suicidal ideation. Thought content includes suicidal plan.     ED Results / Procedures / Treatments   Labs (all labs ordered are listed, but only abnormal results are displayed) Labs Reviewed  CBC WITH DIFFERENTIAL/PLATELET  COMPREHENSIVE METABOLIC PANEL  ETHANOL  RAPID URINE DRUG SCREEN, HOSP PERFORMED  URINALYSIS, ROUTINE W REFLEX MICROSCOPIC    EKG EKG Interpretation Date/Time:  Sunday July 30 2023 09:53:23 EDT Ventricular Rate:  58 PR Interval:  174 QRS Duration:  80 QT Interval:  416 QTC Calculation: 408 R Axis:   11  Text Interpretation: Sinus bradycardia When compared with ECG of 14-May-2022 08:23, PREVIOUS ECG IS PRESENT No STEMI Confirmed by Alvester Chou [phone]) on 07/30/2023 9:56:30 AM  Radiology No results found.  Procedures Procedures   Medications Ordered in ED Medications - No data to display  ED Course/ Medical Decision Making/ A&P    Medical Decision Making Amount and/or Complexity of Data Reviewed Labs: ordered.   67 y.o. male presents to the ER for evaluation of questionable suicidal ideations. Differential diagnosis includes but is not limited to psych versus dementia. Vital signs blood pressure 112/27 and mild pericardia otherwise unremarkable. Physical exam as noted above.   I independently reviewed and interpreted the patient's labs.  Urinalysis and UDS unremarkable.  CBC without leukocytosis or anemia.  CMP does show glucose at 173 and mildly decreased total protein and albumin  otherwise no electrolyte or LFT abnormality.  Ethanol negative.  Patient only seems mildly confused.  Nursing staff at his facility reports that this is at his baseline.  He does not appear acutely encephalopathic.  He has good documentation of dementia.  He is able to follow commands and does not have any focal neurodeficit.   Patient is medically clear for TTS evaluation.  Eligha Bridegroom, NP assessed the patient at bedside and reports that he is stable for discharge back to the facility and does not see any need for inpatient psychiatric care.  On reevaluation, patient eating and no acute distress.  This is them that he will be transferred  back to the facility which he agrees with.  Patient discharged to the facility pending PTAR.   Portions of this report may have been transcribed using voice recognition software. Every effort was made to ensure accuracy; however, inadvertent computerized transcription errors may be present.   Final Clinical Impression(s) / ED Diagnoses Final diagnoses:  Suicidal ideation  Dementia, unspecified dementia severity, unspecified dementia type, unspecified whether behavioral, psychotic, or mood disturbance or anxiety [facility])    Rx / DC Orders ED Discharge Orders     None         Achille Rich, PA-C 07/30/23 1827    Terald Sleeper, MD 07/31/23 [phone]

## 2023-07-30 NOTE — ED Notes (Signed)
Called [facility] back and spoke to [place] and gave report on the phone that patient was coming back to facility. And that PTAR was currently here getting him.

## 2023-07-30 NOTE — Discharge Instructions (Addendum)
You were seen in the ER today for evaluation of suicidal ideations.  You have been cleared by psychiatry to go back to your facility.  Please follow-up with your primary care doctor for evaluation.  If you have any concerns, new or worsening symptoms, please return to the nearest emergency department for evaluation.    Outpatient psychiatric Services  Walk in hours for medication management Monday, Wednesday, Thursday, and Friday from 8:00 AM to 11:00 AM Recommend arriving by by 7:30 AM.  It is first come first serve.    Walk in hours for therapy intake Monday and Wednesday only 8:00 AM to 11:00 AM Encouraged to arrive by 7:30 AM.  It is first come first serve   Inpatient patient psychiatric services [facility] offers comprehensive behavioral heath care services for mental health and substance abuse treatment.  Social work can also assist with referral to or getting you into a rehabilitation program short or long term

## 2023-07-30 NOTE — ED Triage Notes (Signed)
Pt BIB [facility] from [facility] nursing facility d/t suicidal ideation. Per pt's wound care nurse that she found the pt raised his bed all the way up and was trying to rolled off the bed to kill himself. Pt also expressed suicidal ideation to his night shift nurse. Hx dementia but is w it.

## 2023-07-30 NOTE — Consult Note (Signed)
[facility] ED ASSESSMENT   Reason for Consult:  SI Referring Physician:  Trifan Patient Identification: Brandon Vargas MRN:  [id number] ED Chief Complaint: Dementia without behavioral disturbance [facility])  Diagnosis:  Principal Problem:   Dementia without behavioral disturbance [facility])   ED Assessment Time Calculation: Start Time: 1400 Stop Time: 1445 Total Time in Minutes (Assessment Completion): 45   HPI:   Brandon Vargas is a 67 y.o. male patient brought in by EMS from [facility] due to making a suicidal statement. They also felt like the patient was trying to roll out of his bed in a suicide attempt. Pt has no previous psychiatric hx per chart review, he has been diagnosed with Dementia.   Subjective:   Patient seen at [facility] for psychiatric evaluation. Pt is alert and oriented x4. He is able to tell me his full name, DOB, location, current month, year, and president. Pt is very short with his responses, usually replying with "yes" or "no." He denies any suicidal thoughts. When asked if he made a suicidal statement he denied. Denies trying to roll off his bed in an attempt to hurt himself. He denies any previous suicidal attempts through out life. He denies any previous psychiatric diagnosis such as depression or anxiety. He denies HI. Denies AVH. He does mention not sleeping well at night, but appears this is his only complaint.   I spoke with Eber Jones, an RN at [employer] who works with patient. She has no immediate safety concerns, and wanted him evaluated since this is out of his normal character. She is unsure if these events actually happened since this is only the information that was reported to her during night shift. She denies him being aggressive or depressed in nature. She is okay with him returning to the facility, he will need EMS to bring him back to the home.   Pt does not meet IVC or inpatient psychiatric criteria at this time. Pt will need EMS to transport  him back to [facility] nursing home. ED staff aware of dispo.   Past Psychiatric History:  denies  Risk to Self or Others: Is the patient at risk to self? No Has the patient been a risk to self in the past 6 months? No Has the patient been a risk to self within the distant past? No Is the patient a risk to others? No Has the patient been a risk to others in the past 6 months? No Has the patient been a risk to others within the distant past? No  [place] Scale:  Flowsheet Row ED from 07/30/2023 in [facility] Emergency Department at [facility] ED from 11/14/2022 in [facility] Emergency Department at [facility] ED from 07/18/2022 in [facility] Emergency Department at [facility]  C-SSRS RISK CATEGORY No Risk No Risk No Risk       Past Medical History:  Past Medical History:  Diagnosis Date   Anxiety    Arthritis    Depression    Diabetes mellitus without complication (HCC)    GERD (gastroesophageal reflux disease)    Insomnia    Pre-diabetes    Wears glasses     Past Surgical History:  Procedure Laterality Date   CERVICAL LAMINOPLASTY  02/27/2020   COLONOSCOPY  2016   KNEE ARTHROSCOPY W/ MENISCAL REPAIR Left    RADIOLOGY WITH ANESTHESIA N/A 10/03/2019   Procedure: MRI WITH ANESTHESIA BRAIN AND CERVICAL SPINE WITHOUT CONTRAST;  Surgeon: Radiologist,  Medication, MD;  Location: [facility] OR;  Service: Radiology;  Laterality: N/A;   ROTATOR CUFF REPAIR     VASECTOMY     Family History:  Family History  Problem Relation Age of Onset   Lung cancer Mother    Colon cancer Father 25   Diabetes Father    Cancer - Colon Father    Colon polyps Father    Diabetes Sister    Diabetes Brother    Lung cancer Brother    Rectal cancer Neg Hx    Stomach cancer Neg Hx    Social History:  Social History   Substance and Sexual Activity  Alcohol Use Yes   Comment: 1-2 beer a few times per month     Social History   Substance and Sexual Activity  Drug Use Not  Currently   Types: Marijuana    Social History   Socioeconomic History   Marital status: Divorced    Spouse name: Not on file   Number of children: 2   Years of education: Not on file   Highest education level: Not on file  Occupational History   Occupation: [employer] - Part time  Tobacco Use   Smoking status: Never    Passive exposure: Never   Smokeless tobacco: Never  Vaping Use   Vaping status: Never Used  Substance and Sexual Activity   Alcohol use: Yes    Comment: 1-2 beer a few times per month   Drug use: Not Currently    Types: Marijuana   Sexual activity: Not Currently  Other Topics Concern   Not on file  Social History Narrative   Right handed   Lives in [place] [facility]   He is retired from working as a Designer, jewellery.    Social Determinants of Health   Financial Resource Strain: Not on file  Food Insecurity: Not on file  Transportation Needs: Not on file  Physical Activity: Not on file  Stress: Not on file  Social Connections: Not on file   Additional Social History:    Allergies:   Allergies  Allergen Reactions   Metformin And Related Swelling    Labs:  Results for orders placed or performed during the hospital encounter of 07/30/23 (from the past 48 hour(s))  Comprehensive metabolic panel     Status: Abnormal   Collection Time: 07/30/23  9:36 AM  Result Value Ref Range   Sodium 140 135 - 145 mmol/L   Potassium 4.2 3.5 - 5.1 mmol/L   Chloride 102 98 - 111 mmol/L   CO2 27 22 - 32 mmol/L   Glucose, Bld 173 (H) 70 - 99 mg/dL    Comment: Glucose reference range applies only to samples taken after fasting for at least 8 hours.   BUN 22 8 - 23 mg/dL   Creatinine, Ser 3.29 0.61 - 1.24 mg/dL   Calcium 9.0 8.9 - 51.8 mg/dL   Total Protein 6.0 (L) 6.5 - 8.1 g/dL   Albumin 3.3 (L) 3.5 - 5.0 g/dL   AST 18 15 - 41 U/L   ALT 15 0 - 44 U/L   Alkaline Phosphatase 54 38 - 126 U/L   Total Bilirubin 0.4 0.3 - 1.2 mg/dL   GFR,  Estimated >84 >16 mL/min    Comment: (NOTE) Calculated using the CKD-EPI Creatinine Equation (2021)    Anion gap 11 5 - 15    Comment: Performed at [facility], [address] [place], [place] [address]  Ethanol  Status: None   Collection Time: 07/30/23  9:36 AM  Result Value Ref Range   Alcohol, Ethyl (B) <10 <10 mg/dL    Comment: (NOTE) Lowest detectable limit for serum alcohol is 10 mg/dL.  For medical purposes only. Performed at [facility], [address], [place], [place] [address]   CBC with Diff     Status: None   Collection Time: 07/30/23  9:36 AM  Result Value Ref Range   WBC 7.5 4.0 - 10.5 K/uL   RBC 4.79 4.22 - 5.81 MIL/uL   Hemoglobin 14.1 13.0 - 17.0 g/dL   HCT 30.8 65.7 - 84.6 %   MCV 86.4 80.0 - 100.0 fL   MCH 29.4 26.0 - 34.0 pg   MCHC 34.1 30.0 - 36.0 g/dL   RDW 96.2 95.2 - 84.1 %   Platelets 200 150 - 400 K/uL   nRBC 0.0 0.0 - 0.2 %   Neutrophils Relative % 72 %   Neutro Abs 5.4 1.7 - 7.7 K/uL   Lymphocytes Relative 19 %   Lymphs Abs 1.5 0.7 - 4.0 K/uL   Monocytes Relative 6 %   Monocytes Absolute 0.5 0.1 - 1.0 K/uL   Eosinophils Relative 3 %   Eosinophils Absolute 0.2 0.0 - 0.5 K/uL   Basophils Relative 0 %   Basophils Absolute 0.0 0.0 - 0.1 K/uL   Immature Granulocytes 0 %   Abs Immature Granulocytes 0.02 0.00 - 0.07 K/uL    Comment: Performed at [facility], [address], [place], [place] [address]    No current facility-administered medications for this encounter.   Current Outpatient Medications  Medication Sig Dispense Refill   ACCU-CHEK GUIDE test strip      acetaminophen (TYLENOL) 500 MG tablet Take 500 mg by mouth every 6 (six) hours as needed for mild pain.     Cholecalciferol (VITAMIN D3) 25 MCG (1000 UT) CAPS Take 1 capsule (1,000 Units total) by mouth daily. 90 capsule 1   cyclobenzaprine (FLEXERIL) 5 MG tablet Take 1 tablet (5 mg total) by mouth at bedtime as needed for muscle spasms. (Patient taking  differently: Take 5 mg by mouth daily as needed for muscle spasms.) 30 tablet 0   diclofenac Sodium (VOLTAREN) 1 % GEL Apply 2 g topically 4 (four) times daily. To affected joint. (Patient not taking: Reported on 05/14/2022) 100 g 1   donepezil (ARICEPT) 10 MG tablet Take 1 tablet (10 mg total) by mouth daily. 90 tablet 1   Menthol, Topical Analgesic, (BIOFREEZE EX) Apply 1 application. topically daily as needed (For neck pain).     metFORMIN (GLUCOPHAGE) 500 MG tablet Take 1 tablet (500 mg total) by mouth daily with breakfast for 14 days, THEN 1 tablet (500 mg total) 2 (two) times daily with a meal. 166 tablet 0   mirtazapine (REMERON) 7.5 MG tablet Take 1 tablet (7.5 mg total) by mouth at bedtime. 30 tablet 0   pantoprazole (PROTONIX) 40 MG tablet Take 1 tablet (40 mg total) by mouth daily. 90 tablet 0   tamsulosin (FLOMAX) 0.4 MG CAPS capsule Take 0.4 mg by mouth daily.     Psychiatric Specialty Exam: Presentation  General Appearance:  Fairly Groomed  Eye Contact: Fair  Speech: Clear and Coherent  Speech Volume: Normal  Handedness:No data recorded  Mood and Affect  Mood: Euthymic  Affect: Appropriate   Thought Process  Thought Processes: Linear  Descriptions of Associations:Intact  Orientation:Full (Time, Place and Person)  Thought Content:WDL  History of Schizophrenia/Schizoaffective disorder:No data recorded Duration of Psychotic Symptoms:No data recorded Hallucinations:Hallucinations: None  Ideas of Reference:None  Suicidal Thoughts:Suicidal Thoughts: No  Homicidal Thoughts:Homicidal Thoughts: No   Sensorium  Memory: Immediate Fair; Recent Poor; Remote Poor  Judgment: Fair  Insight: Fair   Chartered certified accountant: Fair  Attention Span: Fair  Recall: [employer] of Knowledge: Fair  Language: Fair   Psychomotor Activity  Psychomotor Activity: Psychomotor Activity: Other (comment) (WDL)   Assets  Assets: Housing;  Social Support    Sleep  Sleep: Sleep: Fair   Physical Exam: Physical Exam Neurological:     Mental Status: He is alert and oriented to person, place, and time.  Psychiatric:        Attention and Perception: Attention normal.        Mood and Affect: Mood normal.        Speech: Speech normal.        Behavior: Behavior is cooperative.        Thought Content: Thought content normal.    Review of Systems  Neurological:        Dementia  Psychiatric/Behavioral:  The patient has insomnia.   All other systems reviewed and are negative.  Blood pressure (!) 112/57, pulse (!) 58, temperature 98.2 F (36.8 C), temperature source Oral, resp. rate 16, SpO2 100%. There is no height or weight on file to calculate BMI.  Medical Decision Making: Pt case reviewed and discussed with Dr. Gasper Sells. Pt does not meet IVC or inpatient psychiatric treatment criteria. Pt is psych cleared for d/c and can return to [facility] nursing home.    Disposition: No evidence of imminent risk to self or others at present.   Patient does not meet criteria for psychiatric inpatient admission. Supportive therapy provided about ongoing stressors. Discussed crisis plan, support from social network, calling 911, coming to the Emergency Department, and calling Suicide Hotline.  Eligha Bridegroom, NP 07/30/2023 2:07 PM

## 2023-07-31 DIAGNOSIS — F5101 Primary insomnia: Secondary | ICD-10-CM | POA: Diagnosis not present

## 2023-07-31 DIAGNOSIS — M6281 Muscle weakness (generalized): Secondary | ICD-10-CM | POA: Diagnosis not present

## 2023-07-31 DIAGNOSIS — R2689 Other abnormalities of gait and mobility: Secondary | ICD-10-CM | POA: Diagnosis not present

## 2023-07-31 DIAGNOSIS — R2681 Unsteadiness on feet: Secondary | ICD-10-CM | POA: Diagnosis not present

## 2023-07-31 DIAGNOSIS — R45851 Suicidal ideations: Secondary | ICD-10-CM | POA: Diagnosis not present

## 2023-07-31 DIAGNOSIS — R419 Unspecified symptoms and signs involving cognitive functions and awareness: Secondary | ICD-10-CM | POA: Diagnosis not present

## 2023-08-01 DIAGNOSIS — R2689 Other abnormalities of gait and mobility: Secondary | ICD-10-CM | POA: Diagnosis not present

## 2023-08-01 DIAGNOSIS — R2681 Unsteadiness on feet: Secondary | ICD-10-CM | POA: Diagnosis not present

## 2023-08-01 DIAGNOSIS — M6281 Muscle weakness (generalized): Secondary | ICD-10-CM | POA: Diagnosis not present

## 2023-08-01 DIAGNOSIS — R419 Unspecified symptoms and signs involving cognitive functions and awareness: Secondary | ICD-10-CM | POA: Diagnosis not present

## 2023-08-01 DIAGNOSIS — R635 Abnormal weight gain: Secondary | ICD-10-CM | POA: Diagnosis not present

## 2023-08-01 DIAGNOSIS — R296 Repeated falls: Secondary | ICD-10-CM | POA: Diagnosis not present

## 2023-08-02 DIAGNOSIS — R2681 Unsteadiness on feet: Secondary | ICD-10-CM | POA: Diagnosis not present

## 2023-08-02 DIAGNOSIS — R2689 Other abnormalities of gait and mobility: Secondary | ICD-10-CM | POA: Diagnosis not present

## 2023-08-02 DIAGNOSIS — R419 Unspecified symptoms and signs involving cognitive functions and awareness: Secondary | ICD-10-CM | POA: Diagnosis not present

## 2023-08-02 DIAGNOSIS — M6281 Muscle weakness (generalized): Secondary | ICD-10-CM | POA: Diagnosis not present

## 2023-08-03 DIAGNOSIS — R2689 Other abnormalities of gait and mobility: Secondary | ICD-10-CM | POA: Diagnosis not present

## 2023-08-03 DIAGNOSIS — M6281 Muscle weakness (generalized): Secondary | ICD-10-CM | POA: Diagnosis not present

## 2023-08-03 DIAGNOSIS — R2681 Unsteadiness on feet: Secondary | ICD-10-CM | POA: Diagnosis not present

## 2023-08-03 DIAGNOSIS — R419 Unspecified symptoms and signs involving cognitive functions and awareness: Secondary | ICD-10-CM | POA: Diagnosis not present

## 2023-08-04 ENCOUNTER — Telehealth: Payer: Self-pay | Admitting: *Deleted

## 2023-08-06 DIAGNOSIS — R2689 Other abnormalities of gait and mobility: Secondary | ICD-10-CM | POA: Diagnosis not present

## 2023-08-06 DIAGNOSIS — R2681 Unsteadiness on feet: Secondary | ICD-10-CM | POA: Diagnosis not present

## 2023-08-06 DIAGNOSIS — M6281 Muscle weakness (generalized): Secondary | ICD-10-CM | POA: Diagnosis not present

## 2023-08-06 DIAGNOSIS — R419 Unspecified symptoms and signs involving cognitive functions and awareness: Secondary | ICD-10-CM | POA: Diagnosis not present

## 2023-08-07 DIAGNOSIS — M6281 Muscle weakness (generalized): Secondary | ICD-10-CM | POA: Diagnosis not present

## 2023-08-07 DIAGNOSIS — R2681 Unsteadiness on feet: Secondary | ICD-10-CM | POA: Diagnosis not present

## 2023-08-07 DIAGNOSIS — R419 Unspecified symptoms and signs involving cognitive functions and awareness: Secondary | ICD-10-CM | POA: Diagnosis not present

## 2023-08-07 DIAGNOSIS — R2689 Other abnormalities of gait and mobility: Secondary | ICD-10-CM | POA: Diagnosis not present

## 2023-08-08 DIAGNOSIS — R451 Restlessness and agitation: Secondary | ICD-10-CM | POA: Diagnosis not present

## 2023-08-08 DIAGNOSIS — R635 Abnormal weight gain: Secondary | ICD-10-CM | POA: Diagnosis not present

## 2023-08-08 DIAGNOSIS — R2681 Unsteadiness on feet: Secondary | ICD-10-CM | POA: Diagnosis not present

## 2023-08-08 DIAGNOSIS — M6281 Muscle weakness (generalized): Secondary | ICD-10-CM | POA: Diagnosis not present

## 2023-08-08 DIAGNOSIS — R2689 Other abnormalities of gait and mobility: Secondary | ICD-10-CM | POA: Diagnosis not present

## 2023-08-08 DIAGNOSIS — G4700 Insomnia, unspecified: Secondary | ICD-10-CM | POA: Diagnosis not present

## 2023-08-08 DIAGNOSIS — R419 Unspecified symptoms and signs involving cognitive functions and awareness: Secondary | ICD-10-CM | POA: Diagnosis not present

## 2023-08-09 DIAGNOSIS — R2681 Unsteadiness on feet: Secondary | ICD-10-CM | POA: Diagnosis not present

## 2023-08-09 DIAGNOSIS — R451 Restlessness and agitation: Secondary | ICD-10-CM | POA: Diagnosis not present

## 2023-08-09 DIAGNOSIS — R419 Unspecified symptoms and signs involving cognitive functions and awareness: Secondary | ICD-10-CM | POA: Diagnosis not present

## 2023-08-09 DIAGNOSIS — M6281 Muscle weakness (generalized): Secondary | ICD-10-CM | POA: Diagnosis not present

## 2023-08-09 DIAGNOSIS — R2689 Other abnormalities of gait and mobility: Secondary | ICD-10-CM | POA: Diagnosis not present

## 2023-08-09 DIAGNOSIS — R296 Repeated falls: Secondary | ICD-10-CM | POA: Diagnosis not present

## 2023-08-10 DIAGNOSIS — F0394 Unspecified dementia, unspecified severity, with anxiety: Secondary | ICD-10-CM | POA: Diagnosis not present

## 2023-08-10 DIAGNOSIS — M6281 Muscle weakness (generalized): Secondary | ICD-10-CM | POA: Diagnosis not present

## 2023-08-10 DIAGNOSIS — F332 Major depressive disorder, recurrent severe without psychotic features: Secondary | ICD-10-CM | POA: Diagnosis not present

## 2023-08-10 DIAGNOSIS — R2681 Unsteadiness on feet: Secondary | ICD-10-CM | POA: Diagnosis not present

## 2023-08-10 DIAGNOSIS — R419 Unspecified symptoms and signs involving cognitive functions and awareness: Secondary | ICD-10-CM | POA: Diagnosis not present

## 2023-08-10 DIAGNOSIS — F5101 Primary insomnia: Secondary | ICD-10-CM | POA: Diagnosis not present

## 2023-08-10 DIAGNOSIS — R2689 Other abnormalities of gait and mobility: Secondary | ICD-10-CM | POA: Diagnosis not present

## 2023-08-13 DIAGNOSIS — R2689 Other abnormalities of gait and mobility: Secondary | ICD-10-CM | POA: Diagnosis not present

## 2023-08-13 DIAGNOSIS — R419 Unspecified symptoms and signs involving cognitive functions and awareness: Secondary | ICD-10-CM | POA: Diagnosis not present

## 2023-08-13 DIAGNOSIS — M6281 Muscle weakness (generalized): Secondary | ICD-10-CM | POA: Diagnosis not present

## 2023-08-13 DIAGNOSIS — R2681 Unsteadiness on feet: Secondary | ICD-10-CM | POA: Diagnosis not present

## 2023-08-14 DIAGNOSIS — M6281 Muscle weakness (generalized): Secondary | ICD-10-CM | POA: Diagnosis not present

## 2023-08-14 DIAGNOSIS — R419 Unspecified symptoms and signs involving cognitive functions and awareness: Secondary | ICD-10-CM | POA: Diagnosis not present

## 2023-08-14 DIAGNOSIS — R2681 Unsteadiness on feet: Secondary | ICD-10-CM | POA: Diagnosis not present

## 2023-08-14 DIAGNOSIS — E440 Moderate protein-calorie malnutrition: Secondary | ICD-10-CM | POA: Diagnosis not present

## 2023-08-14 DIAGNOSIS — R2689 Other abnormalities of gait and mobility: Secondary | ICD-10-CM | POA: Diagnosis not present

## 2023-08-15 DIAGNOSIS — R2689 Other abnormalities of gait and mobility: Secondary | ICD-10-CM | POA: Diagnosis not present

## 2023-08-15 DIAGNOSIS — M6281 Muscle weakness (generalized): Secondary | ICD-10-CM | POA: Diagnosis not present

## 2023-08-15 DIAGNOSIS — R419 Unspecified symptoms and signs involving cognitive functions and awareness: Secondary | ICD-10-CM | POA: Diagnosis not present

## 2023-08-15 DIAGNOSIS — R2681 Unsteadiness on feet: Secondary | ICD-10-CM | POA: Diagnosis not present

## 2023-08-16 DIAGNOSIS — M6281 Muscle weakness (generalized): Secondary | ICD-10-CM | POA: Diagnosis not present

## 2023-08-16 DIAGNOSIS — G4700 Insomnia, unspecified: Secondary | ICD-10-CM | POA: Diagnosis not present

## 2023-08-16 DIAGNOSIS — R2681 Unsteadiness on feet: Secondary | ICD-10-CM | POA: Diagnosis not present

## 2023-08-16 DIAGNOSIS — F01511 Vascular dementia, unspecified severity, with agitation: Secondary | ICD-10-CM | POA: Diagnosis not present

## 2023-08-16 DIAGNOSIS — R2689 Other abnormalities of gait and mobility: Secondary | ICD-10-CM | POA: Diagnosis not present

## 2023-08-16 DIAGNOSIS — F32A Depression, unspecified: Secondary | ICD-10-CM | POA: Diagnosis not present

## 2023-08-16 DIAGNOSIS — E1165 Type 2 diabetes mellitus with hyperglycemia: Secondary | ICD-10-CM | POA: Diagnosis not present

## 2023-08-16 DIAGNOSIS — R419 Unspecified symptoms and signs involving cognitive functions and awareness: Secondary | ICD-10-CM | POA: Diagnosis not present

## 2023-08-17 DIAGNOSIS — R419 Unspecified symptoms and signs involving cognitive functions and awareness: Secondary | ICD-10-CM | POA: Diagnosis not present

## 2023-08-17 DIAGNOSIS — R2681 Unsteadiness on feet: Secondary | ICD-10-CM | POA: Diagnosis not present

## 2023-08-17 DIAGNOSIS — R2689 Other abnormalities of gait and mobility: Secondary | ICD-10-CM | POA: Diagnosis not present

## 2023-08-17 DIAGNOSIS — M6281 Muscle weakness (generalized): Secondary | ICD-10-CM | POA: Diagnosis not present

## 2023-08-20 DIAGNOSIS — R2681 Unsteadiness on feet: Secondary | ICD-10-CM | POA: Diagnosis not present

## 2023-08-20 DIAGNOSIS — R2689 Other abnormalities of gait and mobility: Secondary | ICD-10-CM | POA: Diagnosis not present

## 2023-08-20 DIAGNOSIS — R419 Unspecified symptoms and signs involving cognitive functions and awareness: Secondary | ICD-10-CM | POA: Diagnosis not present

## 2023-08-20 DIAGNOSIS — M6281 Muscle weakness (generalized): Secondary | ICD-10-CM | POA: Diagnosis not present

## 2023-08-21 DIAGNOSIS — R296 Repeated falls: Secondary | ICD-10-CM | POA: Diagnosis not present

## 2023-08-21 DIAGNOSIS — M6281 Muscle weakness (generalized): Secondary | ICD-10-CM | POA: Diagnosis not present

## 2023-08-21 DIAGNOSIS — R451 Restlessness and agitation: Secondary | ICD-10-CM | POA: Diagnosis not present

## 2023-08-21 DIAGNOSIS — R2689 Other abnormalities of gait and mobility: Secondary | ICD-10-CM | POA: Diagnosis not present

## 2023-08-21 DIAGNOSIS — R2681 Unsteadiness on feet: Secondary | ICD-10-CM | POA: Diagnosis not present

## 2023-08-21 DIAGNOSIS — R419 Unspecified symptoms and signs involving cognitive functions and awareness: Secondary | ICD-10-CM | POA: Diagnosis not present

## 2023-08-21 DIAGNOSIS — F01511 Vascular dementia, unspecified severity, with agitation: Secondary | ICD-10-CM | POA: Diagnosis not present

## 2023-08-23 DIAGNOSIS — R296 Repeated falls: Secondary | ICD-10-CM | POA: Diagnosis not present

## 2023-08-23 DIAGNOSIS — M6281 Muscle weakness (generalized): Secondary | ICD-10-CM | POA: Diagnosis not present

## 2023-08-24 DIAGNOSIS — M25512 Pain in left shoulder: Secondary | ICD-10-CM | POA: Diagnosis not present

## 2023-08-24 DIAGNOSIS — M79622 Pain in left upper arm: Secondary | ICD-10-CM | POA: Diagnosis not present

## 2023-08-24 DIAGNOSIS — R451 Restlessness and agitation: Secondary | ICD-10-CM | POA: Diagnosis not present

## 2023-08-24 DIAGNOSIS — R635 Abnormal weight gain: Secondary | ICD-10-CM | POA: Diagnosis not present

## 2023-08-24 DIAGNOSIS — G4700 Insomnia, unspecified: Secondary | ICD-10-CM | POA: Diagnosis not present

## 2023-08-29 DIAGNOSIS — F332 Major depressive disorder, recurrent severe without psychotic features: Secondary | ICD-10-CM | POA: Diagnosis not present

## 2023-09-05 DIAGNOSIS — Z9181 History of falling: Secondary | ICD-10-CM | POA: Diagnosis not present

## 2023-09-05 DIAGNOSIS — Z23 Encounter for immunization: Secondary | ICD-10-CM | POA: Diagnosis not present

## 2023-09-07 DIAGNOSIS — F331 Major depressive disorder, recurrent, moderate: Secondary | ICD-10-CM | POA: Diagnosis not present

## 2023-09-07 DIAGNOSIS — F0394 Unspecified dementia, unspecified severity, with anxiety: Secondary | ICD-10-CM | POA: Diagnosis not present

## 2023-09-07 DIAGNOSIS — F5101 Primary insomnia: Secondary | ICD-10-CM | POA: Diagnosis not present

## 2023-09-12 DIAGNOSIS — F332 Major depressive disorder, recurrent severe without psychotic features: Secondary | ICD-10-CM | POA: Diagnosis not present

## 2023-09-13 DIAGNOSIS — R45851 Suicidal ideations: Secondary | ICD-10-CM | POA: Diagnosis not present

## 2023-09-13 DIAGNOSIS — F329 Major depressive disorder, single episode, unspecified: Secondary | ICD-10-CM | POA: Diagnosis not present

## 2023-09-13 DIAGNOSIS — F602 Antisocial personality disorder: Secondary | ICD-10-CM | POA: Diagnosis not present

## 2023-09-16 DIAGNOSIS — H1032 Unspecified acute conjunctivitis, left eye: Secondary | ICD-10-CM | POA: Diagnosis not present

## 2023-09-17 ENCOUNTER — Emergency Department (HOSPITAL_COMMUNITY): Payer: Medicare HMO

## 2023-09-17 ENCOUNTER — Encounter (HOSPITAL_COMMUNITY): Payer: Self-pay | Admitting: Emergency Medicine

## 2023-09-17 ENCOUNTER — Emergency Department (HOSPITAL_COMMUNITY)
Admission: EM | Admit: 2023-09-17 | Discharge: 2023-09-18 | Disposition: A | Discharge status: Home / Self Care | Payer: Medicare HMO | Attending: Emergency Medicine | Admitting: Emergency Medicine

## 2023-09-17 DIAGNOSIS — W19XXXA Unspecified fall, initial encounter: Secondary | ICD-10-CM

## 2023-09-17 DIAGNOSIS — Z79899 Other long term (current) drug therapy: Secondary | ICD-10-CM | POA: Insufficient documentation

## 2023-09-17 DIAGNOSIS — M25551 Pain in right hip: Secondary | ICD-10-CM | POA: Diagnosis not present

## 2023-09-17 DIAGNOSIS — S0081XA Abrasion of other part of head, initial encounter: Secondary | ICD-10-CM | POA: Insufficient documentation

## 2023-09-17 DIAGNOSIS — M160 Bilateral primary osteoarthritis of hip: Secondary | ICD-10-CM | POA: Diagnosis not present

## 2023-09-17 DIAGNOSIS — S0993XA Unspecified injury of face, initial encounter: Secondary | ICD-10-CM | POA: Diagnosis present

## 2023-09-17 DIAGNOSIS — S0083XA Contusion of other part of head, initial encounter: Secondary | ICD-10-CM

## 2023-09-17 DIAGNOSIS — S0091XA Abrasion of unspecified part of head, initial encounter: Secondary | ICD-10-CM | POA: Diagnosis not present

## 2023-09-17 DIAGNOSIS — Z7984 Long term (current) use of oral hypoglycemic drugs: Secondary | ICD-10-CM | POA: Insufficient documentation

## 2023-09-17 DIAGNOSIS — G319 Degenerative disease of nervous system, unspecified: Secondary | ICD-10-CM | POA: Diagnosis not present

## 2023-09-17 DIAGNOSIS — I6782 Cerebral ischemia: Secondary | ICD-10-CM | POA: Diagnosis not present

## 2023-09-17 DIAGNOSIS — R9431 Abnormal electrocardiogram [ECG] [EKG]: Secondary | ICD-10-CM | POA: Diagnosis not present

## 2023-09-17 DIAGNOSIS — S199XXA Unspecified injury of neck, initial encounter: Secondary | ICD-10-CM | POA: Diagnosis not present

## 2023-09-17 DIAGNOSIS — R220 Localized swelling, mass and lump, head: Secondary | ICD-10-CM | POA: Diagnosis not present

## 2023-09-17 DIAGNOSIS — T148XXA Other injury of unspecified body region, initial encounter: Secondary | ICD-10-CM

## 2023-09-17 NOTE — ED Provider Notes (Signed)
[facility] AT [facility] Provider Note   CSN: [id number] Arrival date & time: 09/17/23  1723     History  Chief Complaint  Patient presents with   Brandon Vargas is a 67 y.o. male.  The history is provided by the patient and medical records. No language interpreter was used.  Fall This is a new problem. The problem occurs rarely. The problem has not changed since onset.Pertinent negatives include no chest pain, no abdominal pain, no headaches and no shortness of breath. Nothing aggravates the symptoms. Nothing relieves the symptoms. He has tried nothing for the symptoms. The treatment provided no relief.       Home Medications Prior to Admission medications   Medication Sig Start Date End Date Taking? Authorizing Provider  ACCU-CHEK GUIDE test strip  05/15/20   [provider]  acetaminophen (TYLENOL) 500 MG tablet Take 500 mg by mouth every 6 (six) hours as needed for mild pain.    [provider]  cyclobenzaprine (FLEXERIL) 5 MG tablet Take 1 tablet (5 mg total) by mouth at bedtime as needed for muscle spasms. Patient taking differently: Take 5 mg by mouth daily as needed for muscle spasms. 02/01/22   de [place], Buren Kos, MD  diclofenac Sodium (VOLTAREN) 1 % GEL Apply 2 g topically 4 (four) times daily. To affected joint. 05/05/22   de [place], Raymond J, MD  donepezil (ARICEPT) 10 MG tablet Take 1 tablet (10 mg total) by mouth daily. 12/06/21   [place], Buren Kos, MD  glipiZIDE (GLUCOTROL XL) 2.5 MG 24 hr tablet Take 2.5 mg by mouth daily with breakfast. 09/09/22   [provider]  levETIRAcetam (KEPPRA) 500 MG tablet Take 500 mg by mouth 2 (two) times daily. 03/01/23   [provider]  Menthol, Topical Analgesic, (BIOFREEZE EX) Apply 1 application. topically daily as needed (For neck pain).    [provider]  mirtazapine (REMERON) 7.5 MG tablet Take 1 tablet (7.5 mg total) by mouth at bedtime. 07/30/23    Eligha Bridegroom, NP  pantoprazole (PROTONIX) 40 MG tablet Take 1 tablet (40 mg total) by mouth daily. 02/22/22   [place], Buren Kos, MD  sertraline (ZOLOFT) 100 MG tablet Take 100 mg by mouth daily. 07/27/23   [provider]  tamsulosin (FLOMAX) 0.4 MG CAPS capsule Take 0.4 mg by mouth daily.    [provider]  traZODone (DESYREL) 50 MG tablet Take 50 mg by mouth at bedtime. 07/20/23   [provider]  Vitamin D, Ergocalciferol, (DRISDOL) 1.25 MG (50000 UNIT) CAPS capsule Take 50,000 Units by mouth every 7 (seven) days. wednesday    [provider]      Allergies    Metformin and related    Review of Systems   Review of Systems  Constitutional:  Negative for chills, fatigue and fever.  HENT:  Negative for congestion.   Eyes:  Negative for visual disturbance.  Respiratory:  Negative for cough, chest tightness and shortness of breath.   Cardiovascular:  Negative for chest pain.  Gastrointestinal:  Negative for abdominal pain, constipation, diarrhea, nausea and vomiting.  Genitourinary:  Negative for dysuria and flank pain.  Musculoskeletal:  Positive for neck pain. Negative for back pain and neck stiffness.  Skin:  Positive for wound. Negative for rash.  Neurological:  Negative for dizziness, light-headedness, numbness and headaches.  Psychiatric/Behavioral:  Negative for agitation.   All other systems reviewed and are negative.  Physical Exam Updated Vital Signs BP 114/71 (BP Location: Right Arm)   Pulse 62   Temp 97.8 F (36.6 C) (Oral)   Resp 20   SpO2 99%  Physical Exam Vitals and nursing note reviewed.  Constitutional:      General: He is not in acute distress.    Appearance: He is well-developed. He is not ill-appearing, toxic-appearing or diaphoretic.  HENT:     Head: Abrasion and contusion present. No laceration.      Comments: Pupils symmetric and reactive with normal extraocular movements.      Nose: Nose normal.      Mouth/Throat:     Mouth: Mucous membranes are moist.  Eyes:     Conjunctiva/sclera: Conjunctivae normal.     Pupils: Pupils are equal, round, and reactive to light.  Cardiovascular:     Rate and Rhythm: Normal rate and regular rhythm.     Heart sounds: No murmur heard. Pulmonary:     Effort: Pulmonary effort is normal. No respiratory distress.     Breath sounds: Normal breath sounds. No wheezing, rhonchi or rales.  Chest:     Chest wall: No tenderness.  Abdominal:     General: Abdomen is flat.     Palpations: Abdomen is soft.     Tenderness: There is no abdominal tenderness. There is no guarding or rebound.  Musculoskeletal:        General: Tenderness present. No swelling.     Cervical back: Neck supple. No tenderness.     Right lower leg: No edema.     Left lower leg: No edema.  Skin:    General: Skin is warm and dry.     Capillary Refill: Capillary refill takes less than 2 seconds.     Findings: No erythema or rash.  Neurological:     General: No focal deficit present.     Mental Status: He is alert.  Psychiatric:        Mood and Affect: Mood normal.     ED Results / Procedures / Treatments   Labs (all labs ordered are listed, but only abnormal results are displayed) Labs Reviewed - No data to display  EKG EKG Interpretation Date/Time:  Sunday September 17 2023 17:41:06 EDT Ventricular Rate:  61 PR Interval:  172 QRS Duration:  91 QT Interval:  410 QTC Calculation: 413 R Axis:   -16  Text Interpretation: Sinus or ectopic atrial rhythm Borderline left axis deviation when compared top rior, similar appearance. No STEMI Confirmed by Theda Belfast ([id number]) on 09/17/2023 5:56:37 PM  Radiology CT HEAD WO CONTRAST ( )  Result Date: 09/17/2023 CLINICAL DATA:  Tripped and fell, hit head EXAM: CT HEAD WITHOUT CONTRAST CT CERVICAL SPINE WITHOUT CONTRAST TECHNIQUE: Multidetector CT imaging of the head and cervical spine was performed following the standard protocol  without intravenous contrast. Multiplanar CT image reconstructions of the cervical spine were also generated. RADIATION DOSE REDUCTION: This exam was performed according to the departmental dose-optimization program which includes automated exposure control, adjustment of the mA and/or kV according to patient size and/or use of iterative reconstruction technique. COMPARISON:  CT brain and cervical spine 11/14/2022 FINDINGS: CT HEAD FINDINGS Brain: No acute territorial infarction, hemorrhage or intracranial mass. Stable atrophy and mild chronic small vessel ischemic changes of the white matter. Artifact from bilateral embolization coils. Vascular: No hyperdense fluid vessels. Probable prior middle meningeal artery embolization. Carotid vascular calcification Skull: Normal. Negative for fracture or focal lesion. Sinuses/Orbits: Advanced sinus disease involving the maxillary  ethmoid and frontal sinuses Other: Small posterior scalp swelling. CT CERVICAL SPINE FINDINGS Alignment: Reversal of cervical lordosis. No subluxation. Chronic mild anterior offset at the posterior spinal laminar line at C1. Skull base and vertebrae: No acute fracture. No primary bone lesion or focal pathologic process. Soft tissues and spinal canal: No prevertebral fluid or swelling. No visible canal hematoma. Disc levels: Multilevel degenerative change. Mild to moderate diffuse disc space narrowing and multilevel osteophyte. Facet degenerative changes at multiple levels with foraminal stenosis. Posterior decompression C3-C4 through C6-C7. Upper chest: Negative. Other: None IMPRESSION: 1. No CT evidence for acute intracranial abnormality. Atrophy and chronic small vessel ischemic changes of the white matter. 2. Small posterior scalp swelling. 3. Reversal of cervical lordosis with multilevel degenerative change. No acute osseous abnormality. Electronically Signed   By: Jasmine Pang M.D.   On: 09/17/2023 20:59   CT Cervical Spine Wo  Contrast  Result Date: 09/17/2023 CLINICAL DATA:  Tripped and fell, hit head EXAM: CT HEAD WITHOUT CONTRAST CT CERVICAL SPINE WITHOUT CONTRAST TECHNIQUE: Multidetector CT imaging of the head and cervical spine was performed following the standard protocol without intravenous contrast. Multiplanar CT image reconstructions of the cervical spine were also generated. RADIATION DOSE REDUCTION: This exam was performed according to the departmental dose-optimization program which includes automated exposure control, adjustment of the mA and/or kV according to patient size and/or use of iterative reconstruction technique. COMPARISON:  CT brain and cervical spine 11/14/2022 FINDINGS: CT HEAD FINDINGS Brain: No acute territorial infarction, hemorrhage or intracranial mass. Stable atrophy and mild chronic small vessel ischemic changes of the white matter. Artifact from bilateral embolization coils. Vascular: No hyperdense fluid vessels. Probable prior middle meningeal artery embolization. Carotid vascular calcification Skull: Normal. Negative for fracture or focal lesion. Sinuses/Orbits: Advanced sinus disease involving the maxillary ethmoid and frontal sinuses Other: Small posterior scalp swelling. CT CERVICAL SPINE FINDINGS Alignment: Reversal of cervical lordosis. No subluxation. Chronic mild anterior offset at the posterior spinal laminar line at C1. Skull base and vertebrae: No acute fracture. No primary bone lesion or focal pathologic process. Soft tissues and spinal canal: No prevertebral fluid or swelling. No visible canal hematoma. Disc levels: Multilevel degenerative change. Mild to moderate diffuse disc space narrowing and multilevel osteophyte. Facet degenerative changes at multiple levels with foraminal stenosis. Posterior decompression C3-C4 through C6-C7. Upper chest: Negative. Other: None IMPRESSION: 1. No CT evidence for acute intracranial abnormality. Atrophy and chronic small vessel ischemic changes of the  white matter. 2. Small posterior scalp swelling. 3. Reversal of cervical lordosis with multilevel degenerative change. No acute osseous abnormality. Electronically Signed   By: Jasmine Pang M.D.   On: 09/17/2023 20:59   DG Hip Unilat W or Wo Pelvis 2-3 Views Right  Result Date: 09/17/2023 CLINICAL DATA:  Status post fall with right hip pain EXAM: DG HIP (WITH OR WITHOUT PELVIS) 2V RIGHT COMPARISON:  Radiograph of the pelvis dated 11/14/2022 FINDINGS: There is no evidence of hip fracture or dislocation. Degenerative changes of the bilateral hips. IMPRESSION: 1. No acute displaced fracture or dislocation. 2. Degenerative changes of the bilateral hips. Electronically Signed   By: Agustin Cree M.D.   On: 09/17/2023 19:14    Procedures Procedures    Medications Ordered in ED Medications - No data to display  ED Course/ Medical Decision Making/ A&P  Medical Decision Making Amount and/or Complexity of Data Reviewed Radiology: ordered.    Brandon Vargas is a 67 y.o. male with a past medical history significant for diabetes, anxiety, depression, GERD, and balance issues who presents for fall.  Patient reports she got tripped up and fell had a mechanical fall hitting his right forehead on the ground.  He is complaining of some mild neck pain and right hip pain but has abrasion to his right forehead.  Denies any preceding symptoms such as fevers, chills, congestion, cough, nausea, vomiting, constipation, diarrhea, or urinary changes.  He denies any pain in his extremities aside from the mild right hip pain and denies any pain in his chest abdomen or back.  No other complaints at this time.  On exam, lungs clear.  Chest nontender.  Abdomen nontender.  Mild pain with right hip palpation but had good range of motion and lack of pain with movement.  Intact sensation and strength in extremities.  Symmetric smile.  Pupils symmetric and reactive with normal extract movement.  Mild  abrasion to right forehead.  Neck nontender.  Patient resting comfortably with reassuring vital signs.  Will get CT of the head, neck, and x-ray of the right hip.  If imaging reassuring, dissipate discharge home for mechanical fall with likely musculoskeletal injuries.  9:41 PM Imaging reassuring.  No evidence of skull fracture, neck fracture, intracranial hemorrhage, or hip fracture.    Patient will follow-up with outpatient team and patient agreed.  Patient discharged in good condition to follow-up with PCP.          Final Clinical Impression(s) / ED Diagnoses Final diagnoses:  Fall, initial encounter  Abrasion  Contusion of face, initial encounter    Rx / DC Orders ED Discharge Orders     None      Clinical Impression: 1. Fall, initial encounter   2. Abrasion   3. Contusion of face, initial encounter     Disposition: Discharge  Condition: Good  I have discussed the results, Dx and Tx plan with the pt(& family if present). He/she/they expressed understanding and agree(s) with the plan. Discharge instructions discussed at great length. Strict return precautions discussed and pt &/or family have verbalized understanding of the instructions. No further questions at time of discharge.    New Prescriptions   No medications on file    Follow Up: Rehab, [facility] [address] [place] [address] [phone]     [facility] Emergency Department at [facility] [address] [place] [address] [phone]       Meldrick Buttery, Canary Brim, MD 09/17/23 2141

## 2023-09-17 NOTE — Discharge Instructions (Signed)
Your history, exam, and evaluation today are consistent with mechanical fall and the CT imaging of your head and neck did not show evidence of acute fracture or bleeding.  You had a small scrape to your right forehead that did not show underlying skull fracture.  No large laceration seen.  The x-ray your hip was also reassuring.  We feel you are safe for discharge home.  If any symptoms change or worsen acutely, return to the nearest emergency department and please follow-up with your primary doctor.

## 2023-09-17 NOTE — ED Triage Notes (Signed)
Pt here from [facility] with c/o trip and fall no loc , did hit his head no thinners

## 2023-09-17 NOTE — ED Notes (Signed)
Called report back to [facility]

## 2023-09-17 NOTE — ED Notes (Signed)
[facility] CALLED UNABLE TO GIVE PICK UP TIME

## 2023-09-18 DIAGNOSIS — R451 Restlessness and agitation: Secondary | ICD-10-CM | POA: Diagnosis not present

## 2023-09-18 DIAGNOSIS — Z7401 Bed confinement status: Secondary | ICD-10-CM | POA: Diagnosis not present

## 2023-09-18 DIAGNOSIS — R001 Bradycardia, unspecified: Secondary | ICD-10-CM | POA: Diagnosis not present

## 2023-09-18 DIAGNOSIS — R296 Repeated falls: Secondary | ICD-10-CM | POA: Diagnosis not present

## 2023-09-18 DIAGNOSIS — F01511 Vascular dementia, unspecified severity, with agitation: Secondary | ICD-10-CM | POA: Diagnosis not present

## 2023-09-22 DIAGNOSIS — R2689 Other abnormalities of gait and mobility: Secondary | ICD-10-CM | POA: Diagnosis not present

## 2023-09-22 DIAGNOSIS — F602 Antisocial personality disorder: Secondary | ICD-10-CM | POA: Diagnosis not present

## 2023-09-22 DIAGNOSIS — F329 Major depressive disorder, single episode, unspecified: Secondary | ICD-10-CM | POA: Diagnosis not present

## 2023-09-22 DIAGNOSIS — R2681 Unsteadiness on feet: Secondary | ICD-10-CM | POA: Diagnosis not present

## 2023-09-22 DIAGNOSIS — Z9181 History of falling: Secondary | ICD-10-CM | POA: Diagnosis not present

## 2023-09-25 DIAGNOSIS — R2689 Other abnormalities of gait and mobility: Secondary | ICD-10-CM | POA: Diagnosis not present

## 2023-09-25 DIAGNOSIS — R2681 Unsteadiness on feet: Secondary | ICD-10-CM | POA: Diagnosis not present

## 2023-09-25 DIAGNOSIS — Z9181 History of falling: Secondary | ICD-10-CM | POA: Diagnosis not present

## 2023-09-26 DIAGNOSIS — F332 Major depressive disorder, recurrent severe without psychotic features: Secondary | ICD-10-CM | POA: Diagnosis not present

## 2023-09-27 DIAGNOSIS — R2681 Unsteadiness on feet: Secondary | ICD-10-CM | POA: Diagnosis not present

## 2023-09-27 DIAGNOSIS — R2689 Other abnormalities of gait and mobility: Secondary | ICD-10-CM | POA: Diagnosis not present

## 2023-09-27 DIAGNOSIS — Z9181 History of falling: Secondary | ICD-10-CM | POA: Diagnosis not present

## 2023-10-02 DIAGNOSIS — Z9181 History of falling: Secondary | ICD-10-CM | POA: Diagnosis not present

## 2023-10-02 DIAGNOSIS — R2681 Unsteadiness on feet: Secondary | ICD-10-CM | POA: Diagnosis not present

## 2023-10-02 DIAGNOSIS — R2689 Other abnormalities of gait and mobility: Secondary | ICD-10-CM | POA: Diagnosis not present

## 2023-10-03 DIAGNOSIS — R2689 Other abnormalities of gait and mobility: Secondary | ICD-10-CM | POA: Diagnosis not present

## 2023-10-03 DIAGNOSIS — R2681 Unsteadiness on feet: Secondary | ICD-10-CM | POA: Diagnosis not present

## 2023-10-03 DIAGNOSIS — Z9181 History of falling: Secondary | ICD-10-CM | POA: Diagnosis not present

## 2023-10-04 DIAGNOSIS — R2681 Unsteadiness on feet: Secondary | ICD-10-CM | POA: Diagnosis not present

## 2023-10-04 DIAGNOSIS — Z9181 History of falling: Secondary | ICD-10-CM | POA: Diagnosis not present

## 2023-10-04 DIAGNOSIS — R2689 Other abnormalities of gait and mobility: Secondary | ICD-10-CM | POA: Diagnosis not present

## 2023-10-05 DIAGNOSIS — F5101 Primary insomnia: Secondary | ICD-10-CM | POA: Diagnosis not present

## 2023-10-05 DIAGNOSIS — R45851 Suicidal ideations: Secondary | ICD-10-CM | POA: Diagnosis not present

## 2023-10-05 DIAGNOSIS — F332 Major depressive disorder, recurrent severe without psychotic features: Secondary | ICD-10-CM | POA: Diagnosis not present

## 2023-10-05 DIAGNOSIS — F0394 Unspecified dementia, unspecified severity, with anxiety: Secondary | ICD-10-CM | POA: Diagnosis not present

## 2023-10-05 DIAGNOSIS — F329 Major depressive disorder, single episode, unspecified: Secondary | ICD-10-CM | POA: Diagnosis not present

## 2023-10-05 DIAGNOSIS — F602 Antisocial personality disorder: Secondary | ICD-10-CM | POA: Diagnosis not present

## 2023-10-06 DIAGNOSIS — R2681 Unsteadiness on feet: Secondary | ICD-10-CM | POA: Diagnosis not present

## 2023-10-06 DIAGNOSIS — R2689 Other abnormalities of gait and mobility: Secondary | ICD-10-CM | POA: Diagnosis not present

## 2023-10-06 DIAGNOSIS — Z9181 History of falling: Secondary | ICD-10-CM | POA: Diagnosis not present

## 2023-10-07 DIAGNOSIS — Z9181 History of falling: Secondary | ICD-10-CM | POA: Diagnosis not present

## 2023-10-07 DIAGNOSIS — R2689 Other abnormalities of gait and mobility: Secondary | ICD-10-CM | POA: Diagnosis not present

## 2023-10-07 DIAGNOSIS — R2681 Unsteadiness on feet: Secondary | ICD-10-CM | POA: Diagnosis not present

## 2023-10-09 DIAGNOSIS — R2689 Other abnormalities of gait and mobility: Secondary | ICD-10-CM | POA: Diagnosis not present

## 2023-10-09 DIAGNOSIS — R2681 Unsteadiness on feet: Secondary | ICD-10-CM | POA: Diagnosis not present

## 2023-10-09 DIAGNOSIS — Z9181 History of falling: Secondary | ICD-10-CM | POA: Diagnosis not present

## 2023-10-10 DIAGNOSIS — R45851 Suicidal ideations: Secondary | ICD-10-CM | POA: Diagnosis not present

## 2023-10-10 DIAGNOSIS — F332 Major depressive disorder, recurrent severe without psychotic features: Secondary | ICD-10-CM | POA: Diagnosis not present

## 2023-10-13 DIAGNOSIS — Z9181 History of falling: Secondary | ICD-10-CM | POA: Diagnosis not present

## 2023-10-13 DIAGNOSIS — R2689 Other abnormalities of gait and mobility: Secondary | ICD-10-CM | POA: Diagnosis not present

## 2023-10-13 DIAGNOSIS — R2681 Unsteadiness on feet: Secondary | ICD-10-CM | POA: Diagnosis not present

## 2023-10-16 DIAGNOSIS — Z9181 History of falling: Secondary | ICD-10-CM | POA: Diagnosis not present

## 2023-10-16 DIAGNOSIS — R2681 Unsteadiness on feet: Secondary | ICD-10-CM | POA: Diagnosis not present

## 2023-10-16 DIAGNOSIS — R2689 Other abnormalities of gait and mobility: Secondary | ICD-10-CM | POA: Diagnosis not present

## 2023-10-18 DIAGNOSIS — R2689 Other abnormalities of gait and mobility: Secondary | ICD-10-CM | POA: Diagnosis not present

## 2023-10-18 DIAGNOSIS — R2681 Unsteadiness on feet: Secondary | ICD-10-CM | POA: Diagnosis not present

## 2023-10-18 DIAGNOSIS — Z9181 History of falling: Secondary | ICD-10-CM | POA: Diagnosis not present

## 2023-10-20 DIAGNOSIS — R2689 Other abnormalities of gait and mobility: Secondary | ICD-10-CM | POA: Diagnosis not present

## 2023-10-20 DIAGNOSIS — R2681 Unsteadiness on feet: Secondary | ICD-10-CM | POA: Diagnosis not present

## 2023-10-20 DIAGNOSIS — Z9181 History of falling: Secondary | ICD-10-CM | POA: Diagnosis not present

## 2023-10-20 DIAGNOSIS — E119 Type 2 diabetes mellitus without complications: Secondary | ICD-10-CM | POA: Diagnosis not present

## 2023-10-23 DIAGNOSIS — R2681 Unsteadiness on feet: Secondary | ICD-10-CM | POA: Diagnosis not present

## 2023-10-23 DIAGNOSIS — R2689 Other abnormalities of gait and mobility: Secondary | ICD-10-CM | POA: Diagnosis not present

## 2023-10-23 DIAGNOSIS — Z9181 History of falling: Secondary | ICD-10-CM | POA: Diagnosis not present

## 2023-10-24 DIAGNOSIS — R2689 Other abnormalities of gait and mobility: Secondary | ICD-10-CM | POA: Diagnosis not present

## 2023-10-24 DIAGNOSIS — Z9181 History of falling: Secondary | ICD-10-CM | POA: Diagnosis not present

## 2023-10-24 DIAGNOSIS — E119 Type 2 diabetes mellitus without complications: Secondary | ICD-10-CM | POA: Diagnosis not present

## 2023-10-24 DIAGNOSIS — R2681 Unsteadiness on feet: Secondary | ICD-10-CM | POA: Diagnosis not present

## 2023-11-02 DIAGNOSIS — F0394 Unspecified dementia, unspecified severity, with anxiety: Secondary | ICD-10-CM | POA: Diagnosis not present

## 2023-11-02 DIAGNOSIS — F332 Major depressive disorder, recurrent severe without psychotic features: Secondary | ICD-10-CM | POA: Diagnosis not present

## 2023-11-02 DIAGNOSIS — F5101 Primary insomnia: Secondary | ICD-10-CM | POA: Diagnosis not present

## 2023-11-02 DIAGNOSIS — F602 Antisocial personality disorder: Secondary | ICD-10-CM | POA: Diagnosis not present

## 2023-11-09 DIAGNOSIS — G231 Progressive supranuclear ophthalmoplegia [Steele-Richardson-Olszewski]: Secondary | ICD-10-CM | POA: Diagnosis not present

## 2023-11-09 DIAGNOSIS — R2689 Other abnormalities of gait and mobility: Secondary | ICD-10-CM | POA: Diagnosis not present

## 2023-11-09 DIAGNOSIS — R2681 Unsteadiness on feet: Secondary | ICD-10-CM | POA: Diagnosis not present

## 2023-11-09 DIAGNOSIS — R1312 Dysphagia, oropharyngeal phase: Secondary | ICD-10-CM | POA: Diagnosis not present

## 2023-11-11 DIAGNOSIS — G231 Progressive supranuclear ophthalmoplegia [Steele-Richardson-Olszewski]: Secondary | ICD-10-CM | POA: Diagnosis not present

## 2023-11-11 DIAGNOSIS — R1312 Dysphagia, oropharyngeal phase: Secondary | ICD-10-CM | POA: Diagnosis not present

## 2023-11-11 DIAGNOSIS — R2681 Unsteadiness on feet: Secondary | ICD-10-CM | POA: Diagnosis not present

## 2023-11-11 DIAGNOSIS — R2689 Other abnormalities of gait and mobility: Secondary | ICD-10-CM | POA: Diagnosis not present

## 2023-11-12 DIAGNOSIS — R1312 Dysphagia, oropharyngeal phase: Secondary | ICD-10-CM | POA: Diagnosis not present

## 2023-11-12 DIAGNOSIS — G231 Progressive supranuclear ophthalmoplegia [Steele-Richardson-Olszewski]: Secondary | ICD-10-CM | POA: Diagnosis not present

## 2023-11-12 DIAGNOSIS — R2689 Other abnormalities of gait and mobility: Secondary | ICD-10-CM | POA: Diagnosis not present

## 2023-11-12 DIAGNOSIS — R2681 Unsteadiness on feet: Secondary | ICD-10-CM | POA: Diagnosis not present

## 2023-11-13 DIAGNOSIS — G231 Progressive supranuclear ophthalmoplegia [Steele-Richardson-Olszewski]: Secondary | ICD-10-CM | POA: Diagnosis not present

## 2023-11-13 DIAGNOSIS — R2689 Other abnormalities of gait and mobility: Secondary | ICD-10-CM | POA: Diagnosis not present

## 2023-11-13 DIAGNOSIS — R1312 Dysphagia, oropharyngeal phase: Secondary | ICD-10-CM | POA: Diagnosis not present

## 2023-11-13 DIAGNOSIS — R2681 Unsteadiness on feet: Secondary | ICD-10-CM | POA: Diagnosis not present

## 2023-11-14 DIAGNOSIS — F332 Major depressive disorder, recurrent severe without psychotic features: Secondary | ICD-10-CM | POA: Diagnosis not present

## 2023-11-15 DIAGNOSIS — R1312 Dysphagia, oropharyngeal phase: Secondary | ICD-10-CM | POA: Diagnosis not present

## 2023-11-15 DIAGNOSIS — G231 Progressive supranuclear ophthalmoplegia [Steele-Richardson-Olszewski]: Secondary | ICD-10-CM | POA: Diagnosis not present

## 2023-11-15 DIAGNOSIS — R2689 Other abnormalities of gait and mobility: Secondary | ICD-10-CM | POA: Diagnosis not present

## 2023-11-15 DIAGNOSIS — R2681 Unsteadiness on feet: Secondary | ICD-10-CM | POA: Diagnosis not present

## 2023-11-16 DIAGNOSIS — R2689 Other abnormalities of gait and mobility: Secondary | ICD-10-CM | POA: Diagnosis not present

## 2023-11-16 DIAGNOSIS — R2681 Unsteadiness on feet: Secondary | ICD-10-CM | POA: Diagnosis not present

## 2023-11-16 DIAGNOSIS — R1312 Dysphagia, oropharyngeal phase: Secondary | ICD-10-CM | POA: Diagnosis not present

## 2023-11-16 DIAGNOSIS — G231 Progressive supranuclear ophthalmoplegia [Steele-Richardson-Olszewski]: Secondary | ICD-10-CM | POA: Diagnosis not present

## 2023-11-17 DIAGNOSIS — G231 Progressive supranuclear ophthalmoplegia [Steele-Richardson-Olszewski]: Secondary | ICD-10-CM | POA: Diagnosis not present

## 2023-11-17 DIAGNOSIS — R2689 Other abnormalities of gait and mobility: Secondary | ICD-10-CM | POA: Diagnosis not present

## 2023-11-17 DIAGNOSIS — R2681 Unsteadiness on feet: Secondary | ICD-10-CM | POA: Diagnosis not present

## 2023-11-17 DIAGNOSIS — R1312 Dysphagia, oropharyngeal phase: Secondary | ICD-10-CM | POA: Diagnosis not present

## 2023-11-19 DIAGNOSIS — R2681 Unsteadiness on feet: Secondary | ICD-10-CM | POA: Diagnosis not present

## 2023-11-19 DIAGNOSIS — R1312 Dysphagia, oropharyngeal phase: Secondary | ICD-10-CM | POA: Diagnosis not present

## 2023-11-19 DIAGNOSIS — G231 Progressive supranuclear ophthalmoplegia [Steele-Richardson-Olszewski]: Secondary | ICD-10-CM | POA: Diagnosis not present

## 2023-11-19 DIAGNOSIS — R2689 Other abnormalities of gait and mobility: Secondary | ICD-10-CM | POA: Diagnosis not present

## 2023-11-22 DIAGNOSIS — R2689 Other abnormalities of gait and mobility: Secondary | ICD-10-CM | POA: Diagnosis not present

## 2023-11-22 DIAGNOSIS — R1312 Dysphagia, oropharyngeal phase: Secondary | ICD-10-CM | POA: Diagnosis not present

## 2023-11-22 DIAGNOSIS — R2681 Unsteadiness on feet: Secondary | ICD-10-CM | POA: Diagnosis not present

## 2023-11-22 DIAGNOSIS — G231 Progressive supranuclear ophthalmoplegia [Steele-Richardson-Olszewski]: Secondary | ICD-10-CM | POA: Diagnosis not present

## 2023-11-23 DIAGNOSIS — R2681 Unsteadiness on feet: Secondary | ICD-10-CM | POA: Diagnosis not present

## 2023-11-23 DIAGNOSIS — G231 Progressive supranuclear ophthalmoplegia [Steele-Richardson-Olszewski]: Secondary | ICD-10-CM | POA: Diagnosis not present

## 2023-11-23 DIAGNOSIS — R1312 Dysphagia, oropharyngeal phase: Secondary | ICD-10-CM | POA: Diagnosis not present

## 2023-11-23 DIAGNOSIS — R2689 Other abnormalities of gait and mobility: Secondary | ICD-10-CM | POA: Diagnosis not present

## 2023-11-24 DIAGNOSIS — R1312 Dysphagia, oropharyngeal phase: Secondary | ICD-10-CM | POA: Diagnosis not present

## 2023-11-24 DIAGNOSIS — G231 Progressive supranuclear ophthalmoplegia [Steele-Richardson-Olszewski]: Secondary | ICD-10-CM | POA: Diagnosis not present

## 2023-11-24 DIAGNOSIS — R2681 Unsteadiness on feet: Secondary | ICD-10-CM | POA: Diagnosis not present

## 2023-11-24 DIAGNOSIS — R2689 Other abnormalities of gait and mobility: Secondary | ICD-10-CM | POA: Diagnosis not present

## 2023-11-26 DIAGNOSIS — G231 Progressive supranuclear ophthalmoplegia [Steele-Richardson-Olszewski]: Secondary | ICD-10-CM | POA: Diagnosis not present

## 2023-11-26 DIAGNOSIS — R1312 Dysphagia, oropharyngeal phase: Secondary | ICD-10-CM | POA: Diagnosis not present

## 2023-11-27 DIAGNOSIS — G231 Progressive supranuclear ophthalmoplegia [Steele-Richardson-Olszewski]: Secondary | ICD-10-CM | POA: Diagnosis not present

## 2023-11-27 DIAGNOSIS — R1312 Dysphagia, oropharyngeal phase: Secondary | ICD-10-CM | POA: Diagnosis not present

## 2023-11-28 DIAGNOSIS — F332 Major depressive disorder, recurrent severe without psychotic features: Secondary | ICD-10-CM | POA: Diagnosis not present

## 2023-11-28 DIAGNOSIS — R1312 Dysphagia, oropharyngeal phase: Secondary | ICD-10-CM | POA: Diagnosis not present

## 2023-11-28 DIAGNOSIS — G231 Progressive supranuclear ophthalmoplegia [Steele-Richardson-Olszewski]: Secondary | ICD-10-CM | POA: Diagnosis not present

## 2023-11-29 DIAGNOSIS — R1312 Dysphagia, oropharyngeal phase: Secondary | ICD-10-CM | POA: Diagnosis not present

## 2023-11-29 DIAGNOSIS — G231 Progressive supranuclear ophthalmoplegia [Steele-Richardson-Olszewski]: Secondary | ICD-10-CM | POA: Diagnosis not present

## 2023-11-30 DIAGNOSIS — F5101 Primary insomnia: Secondary | ICD-10-CM | POA: Diagnosis not present

## 2023-11-30 DIAGNOSIS — F332 Major depressive disorder, recurrent severe without psychotic features: Secondary | ICD-10-CM | POA: Diagnosis not present

## 2023-11-30 DIAGNOSIS — F0394 Unspecified dementia, unspecified severity, with anxiety: Secondary | ICD-10-CM | POA: Diagnosis not present

## 2023-11-30 DIAGNOSIS — G231 Progressive supranuclear ophthalmoplegia [Steele-Richardson-Olszewski]: Secondary | ICD-10-CM | POA: Diagnosis not present

## 2023-11-30 DIAGNOSIS — R1312 Dysphagia, oropharyngeal phase: Secondary | ICD-10-CM | POA: Diagnosis not present

## 2023-11-30 DIAGNOSIS — F602 Antisocial personality disorder: Secondary | ICD-10-CM | POA: Diagnosis not present

## 2023-12-12 DIAGNOSIS — F332 Major depressive disorder, recurrent severe without psychotic features: Secondary | ICD-10-CM | POA: Diagnosis not present

## 2023-12-26 DIAGNOSIS — G231 Progressive supranuclear ophthalmoplegia [Steele-Richardson-Olszewski]: Secondary | ICD-10-CM | POA: Diagnosis not present

## 2023-12-26 DIAGNOSIS — F0390 Unspecified dementia without behavioral disturbance: Secondary | ICD-10-CM | POA: Diagnosis not present

## 2023-12-26 DIAGNOSIS — G40909 Epilepsy, unspecified, not intractable, without status epilepticus: Secondary | ICD-10-CM | POA: Diagnosis not present

## 2023-12-26 DIAGNOSIS — R1312 Dysphagia, oropharyngeal phase: Secondary | ICD-10-CM | POA: Diagnosis not present

## 2023-12-26 DIAGNOSIS — N400 Enlarged prostate without lower urinary tract symptoms: Secondary | ICD-10-CM | POA: Diagnosis not present

## 2023-12-28 DIAGNOSIS — G231 Progressive supranuclear ophthalmoplegia [Steele-Richardson-Olszewski]: Secondary | ICD-10-CM | POA: Diagnosis not present

## 2023-12-28 DIAGNOSIS — S065XAD Traumatic subdural hemorrhage with loss of consciousness status unknown, subsequent encounter: Secondary | ICD-10-CM | POA: Diagnosis not present

## 2023-12-28 DIAGNOSIS — F602 Antisocial personality disorder: Secondary | ICD-10-CM | POA: Diagnosis not present

## 2023-12-28 DIAGNOSIS — F0390 Unspecified dementia without behavioral disturbance: Secondary | ICD-10-CM | POA: Diagnosis not present

## 2023-12-28 DIAGNOSIS — R2689 Other abnormalities of gait and mobility: Secondary | ICD-10-CM | POA: Diagnosis not present

## 2023-12-28 DIAGNOSIS — F5101 Primary insomnia: Secondary | ICD-10-CM | POA: Diagnosis not present

## 2023-12-28 DIAGNOSIS — M6281 Muscle weakness (generalized): Secondary | ICD-10-CM | POA: Diagnosis not present

## 2023-12-28 DIAGNOSIS — R1312 Dysphagia, oropharyngeal phase: Secondary | ICD-10-CM | POA: Diagnosis not present

## 2023-12-28 DIAGNOSIS — F0394 Unspecified dementia, unspecified severity, with anxiety: Secondary | ICD-10-CM | POA: Diagnosis not present

## 2023-12-28 DIAGNOSIS — R2681 Unsteadiness on feet: Secondary | ICD-10-CM | POA: Diagnosis not present

## 2023-12-28 DIAGNOSIS — F332 Major depressive disorder, recurrent severe without psychotic features: Secondary | ICD-10-CM | POA: Diagnosis not present

## 2023-12-29 DIAGNOSIS — R1312 Dysphagia, oropharyngeal phase: Secondary | ICD-10-CM | POA: Diagnosis not present

## 2023-12-29 DIAGNOSIS — F0390 Unspecified dementia without behavioral disturbance: Secondary | ICD-10-CM | POA: Diagnosis not present

## 2023-12-29 DIAGNOSIS — M6281 Muscle weakness (generalized): Secondary | ICD-10-CM | POA: Diagnosis not present

## 2023-12-29 DIAGNOSIS — G231 Progressive supranuclear ophthalmoplegia [Steele-Richardson-Olszewski]: Secondary | ICD-10-CM | POA: Diagnosis not present

## 2023-12-29 DIAGNOSIS — S065XAD Traumatic subdural hemorrhage with loss of consciousness status unknown, subsequent encounter: Secondary | ICD-10-CM | POA: Diagnosis not present

## 2023-12-29 DIAGNOSIS — R2689 Other abnormalities of gait and mobility: Secondary | ICD-10-CM | POA: Diagnosis not present

## 2023-12-29 DIAGNOSIS — R2681 Unsteadiness on feet: Secondary | ICD-10-CM | POA: Diagnosis not present

## 2024-01-01 DIAGNOSIS — G231 Progressive supranuclear ophthalmoplegia [Steele-Richardson-Olszewski]: Secondary | ICD-10-CM | POA: Diagnosis not present

## 2024-01-01 DIAGNOSIS — R1312 Dysphagia, oropharyngeal phase: Secondary | ICD-10-CM | POA: Diagnosis not present

## 2024-01-01 DIAGNOSIS — R2681 Unsteadiness on feet: Secondary | ICD-10-CM | POA: Diagnosis not present

## 2024-01-01 DIAGNOSIS — F0390 Unspecified dementia without behavioral disturbance: Secondary | ICD-10-CM | POA: Diagnosis not present

## 2024-01-01 DIAGNOSIS — S065XAD Traumatic subdural hemorrhage with loss of consciousness status unknown, subsequent encounter: Secondary | ICD-10-CM | POA: Diagnosis not present

## 2024-01-01 DIAGNOSIS — R2689 Other abnormalities of gait and mobility: Secondary | ICD-10-CM | POA: Diagnosis not present

## 2024-01-01 DIAGNOSIS — M6281 Muscle weakness (generalized): Secondary | ICD-10-CM | POA: Diagnosis not present

## 2024-01-02 DIAGNOSIS — R2681 Unsteadiness on feet: Secondary | ICD-10-CM | POA: Diagnosis not present

## 2024-01-02 DIAGNOSIS — F0390 Unspecified dementia without behavioral disturbance: Secondary | ICD-10-CM | POA: Diagnosis not present

## 2024-01-02 DIAGNOSIS — F332 Major depressive disorder, recurrent severe without psychotic features: Secondary | ICD-10-CM | POA: Diagnosis not present

## 2024-01-02 DIAGNOSIS — M6281 Muscle weakness (generalized): Secondary | ICD-10-CM | POA: Diagnosis not present

## 2024-01-02 DIAGNOSIS — G231 Progressive supranuclear ophthalmoplegia [Steele-Richardson-Olszewski]: Secondary | ICD-10-CM | POA: Diagnosis not present

## 2024-01-02 DIAGNOSIS — R2689 Other abnormalities of gait and mobility: Secondary | ICD-10-CM | POA: Diagnosis not present

## 2024-01-02 DIAGNOSIS — R1312 Dysphagia, oropharyngeal phase: Secondary | ICD-10-CM | POA: Diagnosis not present

## 2024-01-02 DIAGNOSIS — S065XAD Traumatic subdural hemorrhage with loss of consciousness status unknown, subsequent encounter: Secondary | ICD-10-CM | POA: Diagnosis not present

## 2024-01-03 DIAGNOSIS — S065XAD Traumatic subdural hemorrhage with loss of consciousness status unknown, subsequent encounter: Secondary | ICD-10-CM | POA: Diagnosis not present

## 2024-01-03 DIAGNOSIS — R1312 Dysphagia, oropharyngeal phase: Secondary | ICD-10-CM | POA: Diagnosis not present

## 2024-01-03 DIAGNOSIS — M6281 Muscle weakness (generalized): Secondary | ICD-10-CM | POA: Diagnosis not present

## 2024-01-03 DIAGNOSIS — R2689 Other abnormalities of gait and mobility: Secondary | ICD-10-CM | POA: Diagnosis not present

## 2024-01-03 DIAGNOSIS — R2681 Unsteadiness on feet: Secondary | ICD-10-CM | POA: Diagnosis not present

## 2024-01-03 DIAGNOSIS — G231 Progressive supranuclear ophthalmoplegia [Steele-Richardson-Olszewski]: Secondary | ICD-10-CM | POA: Diagnosis not present

## 2024-01-03 DIAGNOSIS — F0390 Unspecified dementia without behavioral disturbance: Secondary | ICD-10-CM | POA: Diagnosis not present

## 2024-01-04 DIAGNOSIS — G231 Progressive supranuclear ophthalmoplegia [Steele-Richardson-Olszewski]: Secondary | ICD-10-CM | POA: Diagnosis not present

## 2024-01-04 DIAGNOSIS — R2689 Other abnormalities of gait and mobility: Secondary | ICD-10-CM | POA: Diagnosis not present

## 2024-01-04 DIAGNOSIS — F0390 Unspecified dementia without behavioral disturbance: Secondary | ICD-10-CM | POA: Diagnosis not present

## 2024-01-04 DIAGNOSIS — R1312 Dysphagia, oropharyngeal phase: Secondary | ICD-10-CM | POA: Diagnosis not present

## 2024-01-04 DIAGNOSIS — M6281 Muscle weakness (generalized): Secondary | ICD-10-CM | POA: Diagnosis not present

## 2024-01-04 DIAGNOSIS — R2681 Unsteadiness on feet: Secondary | ICD-10-CM | POA: Diagnosis not present

## 2024-01-04 DIAGNOSIS — S065XAD Traumatic subdural hemorrhage with loss of consciousness status unknown, subsequent encounter: Secondary | ICD-10-CM | POA: Diagnosis not present

## 2024-01-05 DIAGNOSIS — R1312 Dysphagia, oropharyngeal phase: Secondary | ICD-10-CM | POA: Diagnosis not present

## 2024-01-05 DIAGNOSIS — G231 Progressive supranuclear ophthalmoplegia [Steele-Richardson-Olszewski]: Secondary | ICD-10-CM | POA: Diagnosis not present

## 2024-01-05 DIAGNOSIS — R2681 Unsteadiness on feet: Secondary | ICD-10-CM | POA: Diagnosis not present

## 2024-01-05 DIAGNOSIS — M6281 Muscle weakness (generalized): Secondary | ICD-10-CM | POA: Diagnosis not present

## 2024-01-05 DIAGNOSIS — F0390 Unspecified dementia without behavioral disturbance: Secondary | ICD-10-CM | POA: Diagnosis not present

## 2024-01-05 DIAGNOSIS — R2689 Other abnormalities of gait and mobility: Secondary | ICD-10-CM | POA: Diagnosis not present

## 2024-01-05 DIAGNOSIS — S065XAD Traumatic subdural hemorrhage with loss of consciousness status unknown, subsequent encounter: Secondary | ICD-10-CM | POA: Diagnosis not present

## 2024-01-08 DIAGNOSIS — F0390 Unspecified dementia without behavioral disturbance: Secondary | ICD-10-CM | POA: Diagnosis not present

## 2024-01-08 DIAGNOSIS — S065XAD Traumatic subdural hemorrhage with loss of consciousness status unknown, subsequent encounter: Secondary | ICD-10-CM | POA: Diagnosis not present

## 2024-01-08 DIAGNOSIS — M6281 Muscle weakness (generalized): Secondary | ICD-10-CM | POA: Diagnosis not present

## 2024-01-08 DIAGNOSIS — G231 Progressive supranuclear ophthalmoplegia [Steele-Richardson-Olszewski]: Secondary | ICD-10-CM | POA: Diagnosis not present

## 2024-01-08 DIAGNOSIS — R2681 Unsteadiness on feet: Secondary | ICD-10-CM | POA: Diagnosis not present

## 2024-01-08 DIAGNOSIS — R2689 Other abnormalities of gait and mobility: Secondary | ICD-10-CM | POA: Diagnosis not present

## 2024-01-08 DIAGNOSIS — R1312 Dysphagia, oropharyngeal phase: Secondary | ICD-10-CM | POA: Diagnosis not present

## 2024-01-09 DIAGNOSIS — G231 Progressive supranuclear ophthalmoplegia [Steele-Richardson-Olszewski]: Secondary | ICD-10-CM | POA: Diagnosis not present

## 2024-01-09 DIAGNOSIS — R2681 Unsteadiness on feet: Secondary | ICD-10-CM | POA: Diagnosis not present

## 2024-01-09 DIAGNOSIS — R2689 Other abnormalities of gait and mobility: Secondary | ICD-10-CM | POA: Diagnosis not present

## 2024-01-09 DIAGNOSIS — S065XAD Traumatic subdural hemorrhage with loss of consciousness status unknown, subsequent encounter: Secondary | ICD-10-CM | POA: Diagnosis not present

## 2024-01-09 DIAGNOSIS — R1312 Dysphagia, oropharyngeal phase: Secondary | ICD-10-CM | POA: Diagnosis not present

## 2024-01-09 DIAGNOSIS — F0390 Unspecified dementia without behavioral disturbance: Secondary | ICD-10-CM | POA: Diagnosis not present

## 2024-01-09 DIAGNOSIS — M6281 Muscle weakness (generalized): Secondary | ICD-10-CM | POA: Diagnosis not present

## 2024-01-10 DIAGNOSIS — M6281 Muscle weakness (generalized): Secondary | ICD-10-CM | POA: Diagnosis not present

## 2024-01-10 DIAGNOSIS — G231 Progressive supranuclear ophthalmoplegia [Steele-Richardson-Olszewski]: Secondary | ICD-10-CM | POA: Diagnosis not present

## 2024-01-10 DIAGNOSIS — R2681 Unsteadiness on feet: Secondary | ICD-10-CM | POA: Diagnosis not present

## 2024-01-10 DIAGNOSIS — R2689 Other abnormalities of gait and mobility: Secondary | ICD-10-CM | POA: Diagnosis not present

## 2024-01-10 DIAGNOSIS — S065XAD Traumatic subdural hemorrhage with loss of consciousness status unknown, subsequent encounter: Secondary | ICD-10-CM | POA: Diagnosis not present

## 2024-01-10 DIAGNOSIS — F0390 Unspecified dementia without behavioral disturbance: Secondary | ICD-10-CM | POA: Diagnosis not present

## 2024-01-10 DIAGNOSIS — R1312 Dysphagia, oropharyngeal phase: Secondary | ICD-10-CM | POA: Diagnosis not present

## 2024-01-11 DIAGNOSIS — R2681 Unsteadiness on feet: Secondary | ICD-10-CM | POA: Diagnosis not present

## 2024-01-11 DIAGNOSIS — S065XAD Traumatic subdural hemorrhage with loss of consciousness status unknown, subsequent encounter: Secondary | ICD-10-CM | POA: Diagnosis not present

## 2024-01-11 DIAGNOSIS — F0390 Unspecified dementia without behavioral disturbance: Secondary | ICD-10-CM | POA: Diagnosis not present

## 2024-01-11 DIAGNOSIS — M6281 Muscle weakness (generalized): Secondary | ICD-10-CM | POA: Diagnosis not present

## 2024-01-11 DIAGNOSIS — R1312 Dysphagia, oropharyngeal phase: Secondary | ICD-10-CM | POA: Diagnosis not present

## 2024-01-11 DIAGNOSIS — R2689 Other abnormalities of gait and mobility: Secondary | ICD-10-CM | POA: Diagnosis not present

## 2024-01-11 DIAGNOSIS — G231 Progressive supranuclear ophthalmoplegia [Steele-Richardson-Olszewski]: Secondary | ICD-10-CM | POA: Diagnosis not present

## 2024-01-12 DIAGNOSIS — G231 Progressive supranuclear ophthalmoplegia [Steele-Richardson-Olszewski]: Secondary | ICD-10-CM | POA: Diagnosis not present

## 2024-01-12 DIAGNOSIS — M6281 Muscle weakness (generalized): Secondary | ICD-10-CM | POA: Diagnosis not present

## 2024-01-12 DIAGNOSIS — R2681 Unsteadiness on feet: Secondary | ICD-10-CM | POA: Diagnosis not present

## 2024-01-12 DIAGNOSIS — R2689 Other abnormalities of gait and mobility: Secondary | ICD-10-CM | POA: Diagnosis not present

## 2024-01-12 DIAGNOSIS — F0390 Unspecified dementia without behavioral disturbance: Secondary | ICD-10-CM | POA: Diagnosis not present

## 2024-01-12 DIAGNOSIS — R1312 Dysphagia, oropharyngeal phase: Secondary | ICD-10-CM | POA: Diagnosis not present

## 2024-01-12 DIAGNOSIS — S065XAD Traumatic subdural hemorrhage with loss of consciousness status unknown, subsequent encounter: Secondary | ICD-10-CM | POA: Diagnosis not present

## 2024-01-15 DIAGNOSIS — S065XAD Traumatic subdural hemorrhage with loss of consciousness status unknown, subsequent encounter: Secondary | ICD-10-CM | POA: Diagnosis not present

## 2024-01-15 DIAGNOSIS — M6281 Muscle weakness (generalized): Secondary | ICD-10-CM | POA: Diagnosis not present

## 2024-01-15 DIAGNOSIS — G231 Progressive supranuclear ophthalmoplegia [Steele-Richardson-Olszewski]: Secondary | ICD-10-CM | POA: Diagnosis not present

## 2024-01-15 DIAGNOSIS — F0390 Unspecified dementia without behavioral disturbance: Secondary | ICD-10-CM | POA: Diagnosis not present

## 2024-01-15 DIAGNOSIS — R2681 Unsteadiness on feet: Secondary | ICD-10-CM | POA: Diagnosis not present

## 2024-01-15 DIAGNOSIS — R1312 Dysphagia, oropharyngeal phase: Secondary | ICD-10-CM | POA: Diagnosis not present

## 2024-01-15 DIAGNOSIS — R2689 Other abnormalities of gait and mobility: Secondary | ICD-10-CM | POA: Diagnosis not present

## 2024-01-16 DIAGNOSIS — R1312 Dysphagia, oropharyngeal phase: Secondary | ICD-10-CM | POA: Diagnosis not present

## 2024-01-16 DIAGNOSIS — G231 Progressive supranuclear ophthalmoplegia [Steele-Richardson-Olszewski]: Secondary | ICD-10-CM | POA: Diagnosis not present

## 2024-01-16 DIAGNOSIS — F332 Major depressive disorder, recurrent severe without psychotic features: Secondary | ICD-10-CM | POA: Diagnosis not present

## 2024-01-16 DIAGNOSIS — R2681 Unsteadiness on feet: Secondary | ICD-10-CM | POA: Diagnosis not present

## 2024-01-16 DIAGNOSIS — S065XAD Traumatic subdural hemorrhage with loss of consciousness status unknown, subsequent encounter: Secondary | ICD-10-CM | POA: Diagnosis not present

## 2024-01-16 DIAGNOSIS — R2689 Other abnormalities of gait and mobility: Secondary | ICD-10-CM | POA: Diagnosis not present

## 2024-01-16 DIAGNOSIS — F0390 Unspecified dementia without behavioral disturbance: Secondary | ICD-10-CM | POA: Diagnosis not present

## 2024-01-16 DIAGNOSIS — M6281 Muscle weakness (generalized): Secondary | ICD-10-CM | POA: Diagnosis not present

## 2024-01-17 DIAGNOSIS — M6281 Muscle weakness (generalized): Secondary | ICD-10-CM | POA: Diagnosis not present

## 2024-01-17 DIAGNOSIS — G231 Progressive supranuclear ophthalmoplegia [Steele-Richardson-Olszewski]: Secondary | ICD-10-CM | POA: Diagnosis not present

## 2024-01-17 DIAGNOSIS — R2689 Other abnormalities of gait and mobility: Secondary | ICD-10-CM | POA: Diagnosis not present

## 2024-01-17 DIAGNOSIS — S065XAD Traumatic subdural hemorrhage with loss of consciousness status unknown, subsequent encounter: Secondary | ICD-10-CM | POA: Diagnosis not present

## 2024-01-17 DIAGNOSIS — R1312 Dysphagia, oropharyngeal phase: Secondary | ICD-10-CM | POA: Diagnosis not present

## 2024-01-17 DIAGNOSIS — F0390 Unspecified dementia without behavioral disturbance: Secondary | ICD-10-CM | POA: Diagnosis not present

## 2024-01-17 DIAGNOSIS — R2681 Unsteadiness on feet: Secondary | ICD-10-CM | POA: Diagnosis not present

## 2024-01-18 DIAGNOSIS — M6281 Muscle weakness (generalized): Secondary | ICD-10-CM | POA: Diagnosis not present

## 2024-01-18 DIAGNOSIS — R2689 Other abnormalities of gait and mobility: Secondary | ICD-10-CM | POA: Diagnosis not present

## 2024-01-18 DIAGNOSIS — G231 Progressive supranuclear ophthalmoplegia [Steele-Richardson-Olszewski]: Secondary | ICD-10-CM | POA: Diagnosis not present

## 2024-01-18 DIAGNOSIS — S065XAD Traumatic subdural hemorrhage with loss of consciousness status unknown, subsequent encounter: Secondary | ICD-10-CM | POA: Diagnosis not present

## 2024-01-18 DIAGNOSIS — F0390 Unspecified dementia without behavioral disturbance: Secondary | ICD-10-CM | POA: Diagnosis not present

## 2024-01-18 DIAGNOSIS — R1312 Dysphagia, oropharyngeal phase: Secondary | ICD-10-CM | POA: Diagnosis not present

## 2024-01-18 DIAGNOSIS — R2681 Unsteadiness on feet: Secondary | ICD-10-CM | POA: Diagnosis not present

## 2024-01-19 DIAGNOSIS — R1312 Dysphagia, oropharyngeal phase: Secondary | ICD-10-CM | POA: Diagnosis not present

## 2024-01-19 DIAGNOSIS — M6281 Muscle weakness (generalized): Secondary | ICD-10-CM | POA: Diagnosis not present

## 2024-01-19 DIAGNOSIS — S065XAD Traumatic subdural hemorrhage with loss of consciousness status unknown, subsequent encounter: Secondary | ICD-10-CM | POA: Diagnosis not present

## 2024-01-19 DIAGNOSIS — F0390 Unspecified dementia without behavioral disturbance: Secondary | ICD-10-CM | POA: Diagnosis not present

## 2024-01-19 DIAGNOSIS — R2689 Other abnormalities of gait and mobility: Secondary | ICD-10-CM | POA: Diagnosis not present

## 2024-01-19 DIAGNOSIS — R2681 Unsteadiness on feet: Secondary | ICD-10-CM | POA: Diagnosis not present

## 2024-01-19 DIAGNOSIS — G231 Progressive supranuclear ophthalmoplegia [Steele-Richardson-Olszewski]: Secondary | ICD-10-CM | POA: Diagnosis not present

## 2024-01-21 DIAGNOSIS — G231 Progressive supranuclear ophthalmoplegia [Steele-Richardson-Olszewski]: Secondary | ICD-10-CM | POA: Diagnosis not present

## 2024-01-21 DIAGNOSIS — S065XAD Traumatic subdural hemorrhage with loss of consciousness status unknown, subsequent encounter: Secondary | ICD-10-CM | POA: Diagnosis not present

## 2024-01-21 DIAGNOSIS — M6281 Muscle weakness (generalized): Secondary | ICD-10-CM | POA: Diagnosis not present

## 2024-01-21 DIAGNOSIS — R2681 Unsteadiness on feet: Secondary | ICD-10-CM | POA: Diagnosis not present

## 2024-01-21 DIAGNOSIS — R2689 Other abnormalities of gait and mobility: Secondary | ICD-10-CM | POA: Diagnosis not present

## 2024-01-21 DIAGNOSIS — R1312 Dysphagia, oropharyngeal phase: Secondary | ICD-10-CM | POA: Diagnosis not present

## 2024-01-21 DIAGNOSIS — F0390 Unspecified dementia without behavioral disturbance: Secondary | ICD-10-CM | POA: Diagnosis not present

## 2024-01-22 DIAGNOSIS — M6281 Muscle weakness (generalized): Secondary | ICD-10-CM | POA: Diagnosis not present

## 2024-01-22 DIAGNOSIS — S065XAD Traumatic subdural hemorrhage with loss of consciousness status unknown, subsequent encounter: Secondary | ICD-10-CM | POA: Diagnosis not present

## 2024-01-22 DIAGNOSIS — R2681 Unsteadiness on feet: Secondary | ICD-10-CM | POA: Diagnosis not present

## 2024-01-22 DIAGNOSIS — R2689 Other abnormalities of gait and mobility: Secondary | ICD-10-CM | POA: Diagnosis not present

## 2024-01-22 DIAGNOSIS — F0390 Unspecified dementia without behavioral disturbance: Secondary | ICD-10-CM | POA: Diagnosis not present

## 2024-01-22 DIAGNOSIS — G231 Progressive supranuclear ophthalmoplegia [Steele-Richardson-Olszewski]: Secondary | ICD-10-CM | POA: Diagnosis not present

## 2024-01-22 DIAGNOSIS — R1312 Dysphagia, oropharyngeal phase: Secondary | ICD-10-CM | POA: Diagnosis not present

## 2024-01-23 DIAGNOSIS — G231 Progressive supranuclear ophthalmoplegia [Steele-Richardson-Olszewski]: Secondary | ICD-10-CM | POA: Diagnosis not present

## 2024-01-23 DIAGNOSIS — M6281 Muscle weakness (generalized): Secondary | ICD-10-CM | POA: Diagnosis not present

## 2024-01-23 DIAGNOSIS — F0390 Unspecified dementia without behavioral disturbance: Secondary | ICD-10-CM | POA: Diagnosis not present

## 2024-01-23 DIAGNOSIS — R1312 Dysphagia, oropharyngeal phase: Secondary | ICD-10-CM | POA: Diagnosis not present

## 2024-01-23 DIAGNOSIS — S065XAD Traumatic subdural hemorrhage with loss of consciousness status unknown, subsequent encounter: Secondary | ICD-10-CM | POA: Diagnosis not present

## 2024-01-23 DIAGNOSIS — R2689 Other abnormalities of gait and mobility: Secondary | ICD-10-CM | POA: Diagnosis not present

## 2024-01-23 DIAGNOSIS — R2681 Unsteadiness on feet: Secondary | ICD-10-CM | POA: Diagnosis not present

## 2024-01-24 DIAGNOSIS — G231 Progressive supranuclear ophthalmoplegia [Steele-Richardson-Olszewski]: Secondary | ICD-10-CM | POA: Diagnosis not present

## 2024-01-24 DIAGNOSIS — M6281 Muscle weakness (generalized): Secondary | ICD-10-CM | POA: Diagnosis not present

## 2024-01-24 DIAGNOSIS — R1312 Dysphagia, oropharyngeal phase: Secondary | ICD-10-CM | POA: Diagnosis not present

## 2024-01-24 DIAGNOSIS — F0390 Unspecified dementia without behavioral disturbance: Secondary | ICD-10-CM | POA: Diagnosis not present

## 2024-01-24 DIAGNOSIS — R2681 Unsteadiness on feet: Secondary | ICD-10-CM | POA: Diagnosis not present

## 2024-01-24 DIAGNOSIS — R2689 Other abnormalities of gait and mobility: Secondary | ICD-10-CM | POA: Diagnosis not present

## 2024-01-24 DIAGNOSIS — S065XAD Traumatic subdural hemorrhage with loss of consciousness status unknown, subsequent encounter: Secondary | ICD-10-CM | POA: Diagnosis not present

## 2024-01-25 DIAGNOSIS — R1312 Dysphagia, oropharyngeal phase: Secondary | ICD-10-CM | POA: Diagnosis not present

## 2024-01-25 DIAGNOSIS — F0394 Unspecified dementia, unspecified severity, with anxiety: Secondary | ICD-10-CM | POA: Diagnosis not present

## 2024-01-25 DIAGNOSIS — F332 Major depressive disorder, recurrent severe without psychotic features: Secondary | ICD-10-CM | POA: Diagnosis not present

## 2024-01-25 DIAGNOSIS — R2681 Unsteadiness on feet: Secondary | ICD-10-CM | POA: Diagnosis not present

## 2024-01-25 DIAGNOSIS — F602 Antisocial personality disorder: Secondary | ICD-10-CM | POA: Diagnosis not present

## 2024-01-25 DIAGNOSIS — S065XAD Traumatic subdural hemorrhage with loss of consciousness status unknown, subsequent encounter: Secondary | ICD-10-CM | POA: Diagnosis not present

## 2024-01-25 DIAGNOSIS — M6281 Muscle weakness (generalized): Secondary | ICD-10-CM | POA: Diagnosis not present

## 2024-01-25 DIAGNOSIS — F0390 Unspecified dementia without behavioral disturbance: Secondary | ICD-10-CM | POA: Diagnosis not present

## 2024-01-25 DIAGNOSIS — R2689 Other abnormalities of gait and mobility: Secondary | ICD-10-CM | POA: Diagnosis not present

## 2024-01-25 DIAGNOSIS — G231 Progressive supranuclear ophthalmoplegia [Steele-Richardson-Olszewski]: Secondary | ICD-10-CM | POA: Diagnosis not present

## 2024-01-29 DIAGNOSIS — M6281 Muscle weakness (generalized): Secondary | ICD-10-CM | POA: Diagnosis not present

## 2024-01-29 DIAGNOSIS — F0390 Unspecified dementia without behavioral disturbance: Secondary | ICD-10-CM | POA: Diagnosis not present

## 2024-01-29 DIAGNOSIS — R1312 Dysphagia, oropharyngeal phase: Secondary | ICD-10-CM | POA: Diagnosis not present

## 2024-01-29 DIAGNOSIS — Z9181 History of falling: Secondary | ICD-10-CM | POA: Diagnosis not present

## 2024-01-30 DIAGNOSIS — F0390 Unspecified dementia without behavioral disturbance: Secondary | ICD-10-CM | POA: Diagnosis not present

## 2024-01-30 DIAGNOSIS — G4700 Insomnia, unspecified: Secondary | ICD-10-CM | POA: Diagnosis not present

## 2024-01-30 DIAGNOSIS — F32A Depression, unspecified: Secondary | ICD-10-CM | POA: Diagnosis not present

## 2024-01-30 DIAGNOSIS — M6281 Muscle weakness (generalized): Secondary | ICD-10-CM | POA: Diagnosis not present

## 2024-01-30 DIAGNOSIS — F332 Major depressive disorder, recurrent severe without psychotic features: Secondary | ICD-10-CM | POA: Diagnosis not present

## 2024-01-30 DIAGNOSIS — G40909 Epilepsy, unspecified, not intractable, without status epilepticus: Secondary | ICD-10-CM | POA: Diagnosis not present

## 2024-01-30 DIAGNOSIS — Z9181 History of falling: Secondary | ICD-10-CM | POA: Diagnosis not present

## 2024-01-30 DIAGNOSIS — N400 Enlarged prostate without lower urinary tract symptoms: Secondary | ICD-10-CM | POA: Diagnosis not present

## 2024-01-30 DIAGNOSIS — R1312 Dysphagia, oropharyngeal phase: Secondary | ICD-10-CM | POA: Diagnosis not present

## 2024-01-31 DIAGNOSIS — F0390 Unspecified dementia without behavioral disturbance: Secondary | ICD-10-CM | POA: Diagnosis not present

## 2024-01-31 DIAGNOSIS — M6281 Muscle weakness (generalized): Secondary | ICD-10-CM | POA: Diagnosis not present

## 2024-01-31 DIAGNOSIS — Z9181 History of falling: Secondary | ICD-10-CM | POA: Diagnosis not present

## 2024-01-31 DIAGNOSIS — R1312 Dysphagia, oropharyngeal phase: Secondary | ICD-10-CM | POA: Diagnosis not present

## 2024-02-01 DIAGNOSIS — F0390 Unspecified dementia without behavioral disturbance: Secondary | ICD-10-CM | POA: Diagnosis not present

## 2024-02-01 DIAGNOSIS — R1312 Dysphagia, oropharyngeal phase: Secondary | ICD-10-CM | POA: Diagnosis not present

## 2024-02-01 DIAGNOSIS — M6281 Muscle weakness (generalized): Secondary | ICD-10-CM | POA: Diagnosis not present

## 2024-02-01 DIAGNOSIS — Z9181 History of falling: Secondary | ICD-10-CM | POA: Diagnosis not present

## 2024-02-11 DIAGNOSIS — F0390 Unspecified dementia without behavioral disturbance: Secondary | ICD-10-CM | POA: Diagnosis not present

## 2024-02-11 DIAGNOSIS — Z9181 History of falling: Secondary | ICD-10-CM | POA: Diagnosis not present

## 2024-02-11 DIAGNOSIS — R1312 Dysphagia, oropharyngeal phase: Secondary | ICD-10-CM | POA: Diagnosis not present

## 2024-02-11 DIAGNOSIS — M6281 Muscle weakness (generalized): Secondary | ICD-10-CM | POA: Diagnosis not present

## 2024-02-13 DIAGNOSIS — F0390 Unspecified dementia without behavioral disturbance: Secondary | ICD-10-CM | POA: Diagnosis not present

## 2024-02-13 DIAGNOSIS — Z9181 History of falling: Secondary | ICD-10-CM | POA: Diagnosis not present

## 2024-02-13 DIAGNOSIS — M6281 Muscle weakness (generalized): Secondary | ICD-10-CM | POA: Diagnosis not present

## 2024-02-13 DIAGNOSIS — R1312 Dysphagia, oropharyngeal phase: Secondary | ICD-10-CM | POA: Diagnosis not present

## 2024-02-13 DIAGNOSIS — F332 Major depressive disorder, recurrent severe without psychotic features: Secondary | ICD-10-CM | POA: Diagnosis not present

## 2024-02-14 DIAGNOSIS — M6281 Muscle weakness (generalized): Secondary | ICD-10-CM | POA: Diagnosis not present

## 2024-02-14 DIAGNOSIS — F0390 Unspecified dementia without behavioral disturbance: Secondary | ICD-10-CM | POA: Diagnosis not present

## 2024-02-14 DIAGNOSIS — R1312 Dysphagia, oropharyngeal phase: Secondary | ICD-10-CM | POA: Diagnosis not present

## 2024-02-14 DIAGNOSIS — Z9181 History of falling: Secondary | ICD-10-CM | POA: Diagnosis not present

## 2024-02-16 DIAGNOSIS — M6281 Muscle weakness (generalized): Secondary | ICD-10-CM | POA: Diagnosis not present

## 2024-02-16 DIAGNOSIS — Z9181 History of falling: Secondary | ICD-10-CM | POA: Diagnosis not present

## 2024-02-16 DIAGNOSIS — R1312 Dysphagia, oropharyngeal phase: Secondary | ICD-10-CM | POA: Diagnosis not present

## 2024-02-16 DIAGNOSIS — F0390 Unspecified dementia without behavioral disturbance: Secondary | ICD-10-CM | POA: Diagnosis not present

## 2024-02-17 DIAGNOSIS — F0390 Unspecified dementia without behavioral disturbance: Secondary | ICD-10-CM | POA: Diagnosis not present

## 2024-02-17 DIAGNOSIS — Z9181 History of falling: Secondary | ICD-10-CM | POA: Diagnosis not present

## 2024-02-17 DIAGNOSIS — M6281 Muscle weakness (generalized): Secondary | ICD-10-CM | POA: Diagnosis not present

## 2024-02-17 DIAGNOSIS — R1312 Dysphagia, oropharyngeal phase: Secondary | ICD-10-CM | POA: Diagnosis not present

## 2024-02-19 DIAGNOSIS — M6281 Muscle weakness (generalized): Secondary | ICD-10-CM | POA: Diagnosis not present

## 2024-02-19 DIAGNOSIS — F0390 Unspecified dementia without behavioral disturbance: Secondary | ICD-10-CM | POA: Diagnosis not present

## 2024-02-19 DIAGNOSIS — Z9181 History of falling: Secondary | ICD-10-CM | POA: Diagnosis not present

## 2024-02-19 DIAGNOSIS — R1312 Dysphagia, oropharyngeal phase: Secondary | ICD-10-CM | POA: Diagnosis not present

## 2024-02-20 DIAGNOSIS — M6281 Muscle weakness (generalized): Secondary | ICD-10-CM | POA: Diagnosis not present

## 2024-02-20 DIAGNOSIS — Z9181 History of falling: Secondary | ICD-10-CM | POA: Diagnosis not present

## 2024-02-20 DIAGNOSIS — R1312 Dysphagia, oropharyngeal phase: Secondary | ICD-10-CM | POA: Diagnosis not present

## 2024-02-20 DIAGNOSIS — F0390 Unspecified dementia without behavioral disturbance: Secondary | ICD-10-CM | POA: Diagnosis not present

## 2024-02-21 DIAGNOSIS — R1312 Dysphagia, oropharyngeal phase: Secondary | ICD-10-CM | POA: Diagnosis not present

## 2024-02-21 DIAGNOSIS — M6281 Muscle weakness (generalized): Secondary | ICD-10-CM | POA: Diagnosis not present

## 2024-02-21 DIAGNOSIS — Z9181 History of falling: Secondary | ICD-10-CM | POA: Diagnosis not present

## 2024-02-21 DIAGNOSIS — F0390 Unspecified dementia without behavioral disturbance: Secondary | ICD-10-CM | POA: Diagnosis not present

## 2024-02-22 DIAGNOSIS — M6281 Muscle weakness (generalized): Secondary | ICD-10-CM | POA: Diagnosis not present

## 2024-02-22 DIAGNOSIS — F0394 Unspecified dementia, unspecified severity, with anxiety: Secondary | ICD-10-CM | POA: Diagnosis not present

## 2024-02-22 DIAGNOSIS — F602 Antisocial personality disorder: Secondary | ICD-10-CM | POA: Diagnosis not present

## 2024-02-22 DIAGNOSIS — F332 Major depressive disorder, recurrent severe without psychotic features: Secondary | ICD-10-CM | POA: Diagnosis not present

## 2024-02-22 DIAGNOSIS — R1312 Dysphagia, oropharyngeal phase: Secondary | ICD-10-CM | POA: Diagnosis not present

## 2024-02-22 DIAGNOSIS — F5101 Primary insomnia: Secondary | ICD-10-CM | POA: Diagnosis not present

## 2024-02-22 DIAGNOSIS — Z9181 History of falling: Secondary | ICD-10-CM | POA: Diagnosis not present

## 2024-02-22 DIAGNOSIS — F0390 Unspecified dementia without behavioral disturbance: Secondary | ICD-10-CM | POA: Diagnosis not present

## 2024-02-25 DIAGNOSIS — M6281 Muscle weakness (generalized): Secondary | ICD-10-CM | POA: Diagnosis not present

## 2024-02-25 DIAGNOSIS — Z9181 History of falling: Secondary | ICD-10-CM | POA: Diagnosis not present

## 2024-02-26 DIAGNOSIS — E119 Type 2 diabetes mellitus without complications: Secondary | ICD-10-CM | POA: Diagnosis not present

## 2024-02-26 DIAGNOSIS — G9608 Other cranial cerebrospinal fluid leak: Secondary | ICD-10-CM | POA: Diagnosis not present

## 2024-02-26 DIAGNOSIS — Z9181 History of falling: Secondary | ICD-10-CM | POA: Diagnosis not present

## 2024-02-26 DIAGNOSIS — N400 Enlarged prostate without lower urinary tract symptoms: Secondary | ICD-10-CM | POA: Diagnosis not present

## 2024-02-26 DIAGNOSIS — F32A Depression, unspecified: Secondary | ICD-10-CM | POA: Diagnosis not present

## 2024-02-26 DIAGNOSIS — F0390 Unspecified dementia without behavioral disturbance: Secondary | ICD-10-CM | POA: Diagnosis not present

## 2024-02-26 DIAGNOSIS — M6281 Muscle weakness (generalized): Secondary | ICD-10-CM | POA: Diagnosis not present

## 2024-02-27 DIAGNOSIS — M6281 Muscle weakness (generalized): Secondary | ICD-10-CM | POA: Diagnosis not present

## 2024-02-27 DIAGNOSIS — Z9181 History of falling: Secondary | ICD-10-CM | POA: Diagnosis not present

## 2024-02-28 DIAGNOSIS — Z9181 History of falling: Secondary | ICD-10-CM | POA: Diagnosis not present

## 2024-02-28 DIAGNOSIS — M6281 Muscle weakness (generalized): Secondary | ICD-10-CM | POA: Diagnosis not present

## 2024-02-29 DIAGNOSIS — Z9181 History of falling: Secondary | ICD-10-CM | POA: Diagnosis not present

## 2024-02-29 DIAGNOSIS — M6281 Muscle weakness (generalized): Secondary | ICD-10-CM | POA: Diagnosis not present

## 2024-03-04 DIAGNOSIS — M6281 Muscle weakness (generalized): Secondary | ICD-10-CM | POA: Diagnosis not present

## 2024-03-04 DIAGNOSIS — Z9181 History of falling: Secondary | ICD-10-CM | POA: Diagnosis not present

## 2024-03-05 DIAGNOSIS — Z9181 History of falling: Secondary | ICD-10-CM | POA: Diagnosis not present

## 2024-03-05 DIAGNOSIS — M6281 Muscle weakness (generalized): Secondary | ICD-10-CM | POA: Diagnosis not present

## 2024-03-06 DIAGNOSIS — M6281 Muscle weakness (generalized): Secondary | ICD-10-CM | POA: Diagnosis not present

## 2024-03-06 DIAGNOSIS — Z9181 History of falling: Secondary | ICD-10-CM | POA: Diagnosis not present

## 2024-03-07 DIAGNOSIS — M6281 Muscle weakness (generalized): Secondary | ICD-10-CM | POA: Diagnosis not present

## 2024-03-07 DIAGNOSIS — Z9181 History of falling: Secondary | ICD-10-CM | POA: Diagnosis not present

## 2024-03-08 DIAGNOSIS — Z9181 History of falling: Secondary | ICD-10-CM | POA: Diagnosis not present

## 2024-03-08 DIAGNOSIS — M6281 Muscle weakness (generalized): Secondary | ICD-10-CM | POA: Diagnosis not present

## 2024-03-11 DIAGNOSIS — Z9181 History of falling: Secondary | ICD-10-CM | POA: Diagnosis not present

## 2024-03-11 DIAGNOSIS — M6281 Muscle weakness (generalized): Secondary | ICD-10-CM | POA: Diagnosis not present

## 2024-03-12 DIAGNOSIS — M6281 Muscle weakness (generalized): Secondary | ICD-10-CM | POA: Diagnosis not present

## 2024-03-12 DIAGNOSIS — Z9181 History of falling: Secondary | ICD-10-CM | POA: Diagnosis not present

## 2024-03-13 DIAGNOSIS — Z9181 History of falling: Secondary | ICD-10-CM | POA: Diagnosis not present

## 2024-03-13 DIAGNOSIS — M6281 Muscle weakness (generalized): Secondary | ICD-10-CM | POA: Diagnosis not present

## 2024-03-14 DIAGNOSIS — Z9181 History of falling: Secondary | ICD-10-CM | POA: Diagnosis not present

## 2024-03-14 DIAGNOSIS — M6281 Muscle weakness (generalized): Secondary | ICD-10-CM | POA: Diagnosis not present

## 2024-03-19 DIAGNOSIS — F332 Major depressive disorder, recurrent severe without psychotic features: Secondary | ICD-10-CM | POA: Diagnosis not present

## 2024-03-21 DIAGNOSIS — F602 Antisocial personality disorder: Secondary | ICD-10-CM | POA: Diagnosis not present

## 2024-03-21 DIAGNOSIS — F0394 Unspecified dementia, unspecified severity, with anxiety: Secondary | ICD-10-CM | POA: Diagnosis not present

## 2024-03-21 DIAGNOSIS — F5101 Primary insomnia: Secondary | ICD-10-CM | POA: Diagnosis not present

## 2024-03-21 DIAGNOSIS — F332 Major depressive disorder, recurrent severe without psychotic features: Secondary | ICD-10-CM | POA: Diagnosis not present

## 2024-04-04 DIAGNOSIS — R1312 Dysphagia, oropharyngeal phase: Secondary | ICD-10-CM | POA: Diagnosis not present

## 2024-04-04 DIAGNOSIS — H1132 Conjunctival hemorrhage, left eye: Secondary | ICD-10-CM | POA: Diagnosis not present

## 2024-04-05 DIAGNOSIS — R1312 Dysphagia, oropharyngeal phase: Secondary | ICD-10-CM | POA: Diagnosis not present

## 2024-04-06 DIAGNOSIS — R1312 Dysphagia, oropharyngeal phase: Secondary | ICD-10-CM | POA: Diagnosis not present

## 2024-04-07 DIAGNOSIS — R1312 Dysphagia, oropharyngeal phase: Secondary | ICD-10-CM | POA: Diagnosis not present

## 2024-04-08 DIAGNOSIS — R1312 Dysphagia, oropharyngeal phase: Secondary | ICD-10-CM | POA: Diagnosis not present

## 2024-04-09 DIAGNOSIS — F332 Major depressive disorder, recurrent severe without psychotic features: Secondary | ICD-10-CM | POA: Diagnosis not present

## 2024-04-11 DIAGNOSIS — R1312 Dysphagia, oropharyngeal phase: Secondary | ICD-10-CM | POA: Diagnosis not present

## 2024-04-12 DIAGNOSIS — F602 Antisocial personality disorder: Secondary | ICD-10-CM | POA: Diagnosis not present

## 2024-04-12 DIAGNOSIS — F5101 Primary insomnia: Secondary | ICD-10-CM | POA: Diagnosis not present

## 2024-04-12 DIAGNOSIS — F332 Major depressive disorder, recurrent severe without psychotic features: Secondary | ICD-10-CM | POA: Diagnosis not present

## 2024-04-12 DIAGNOSIS — F0394 Unspecified dementia, unspecified severity, with anxiety: Secondary | ICD-10-CM | POA: Diagnosis not present

## 2024-04-15 DIAGNOSIS — R1312 Dysphagia, oropharyngeal phase: Secondary | ICD-10-CM | POA: Diagnosis not present

## 2024-04-16 DIAGNOSIS — R1312 Dysphagia, oropharyngeal phase: Secondary | ICD-10-CM | POA: Diagnosis not present

## 2024-04-17 DIAGNOSIS — R1312 Dysphagia, oropharyngeal phase: Secondary | ICD-10-CM | POA: Diagnosis not present

## 2024-04-18 DIAGNOSIS — R569 Unspecified convulsions: Secondary | ICD-10-CM | POA: Diagnosis not present

## 2024-04-18 DIAGNOSIS — R1312 Dysphagia, oropharyngeal phase: Secondary | ICD-10-CM | POA: Diagnosis not present

## 2024-04-19 DIAGNOSIS — R569 Unspecified convulsions: Secondary | ICD-10-CM | POA: Diagnosis not present

## 2024-04-19 DIAGNOSIS — H1132 Conjunctival hemorrhage, left eye: Secondary | ICD-10-CM | POA: Diagnosis not present

## 2024-04-19 DIAGNOSIS — G40909 Epilepsy, unspecified, not intractable, without status epilepticus: Secondary | ICD-10-CM | POA: Diagnosis not present

## 2024-04-19 DIAGNOSIS — G4700 Insomnia, unspecified: Secondary | ICD-10-CM | POA: Diagnosis not present

## 2024-04-20 DIAGNOSIS — R1312 Dysphagia, oropharyngeal phase: Secondary | ICD-10-CM | POA: Diagnosis not present

## 2024-04-21 DIAGNOSIS — R1312 Dysphagia, oropharyngeal phase: Secondary | ICD-10-CM | POA: Diagnosis not present

## 2024-04-23 DIAGNOSIS — R1312 Dysphagia, oropharyngeal phase: Secondary | ICD-10-CM | POA: Diagnosis not present

## 2024-04-23 DIAGNOSIS — G40909 Epilepsy, unspecified, not intractable, without status epilepticus: Secondary | ICD-10-CM | POA: Diagnosis not present

## 2024-04-23 DIAGNOSIS — G4700 Insomnia, unspecified: Secondary | ICD-10-CM | POA: Diagnosis not present

## 2024-04-23 DIAGNOSIS — F0390 Unspecified dementia without behavioral disturbance: Secondary | ICD-10-CM | POA: Diagnosis not present

## 2024-04-24 DIAGNOSIS — G40909 Epilepsy, unspecified, not intractable, without status epilepticus: Secondary | ICD-10-CM | POA: Diagnosis not present

## 2024-04-24 DIAGNOSIS — R1312 Dysphagia, oropharyngeal phase: Secondary | ICD-10-CM | POA: Diagnosis not present

## 2024-04-24 DIAGNOSIS — F32A Depression, unspecified: Secondary | ICD-10-CM | POA: Diagnosis not present

## 2024-04-24 DIAGNOSIS — F0390 Unspecified dementia without behavioral disturbance: Secondary | ICD-10-CM | POA: Diagnosis not present

## 2024-04-24 DIAGNOSIS — N400 Enlarged prostate without lower urinary tract symptoms: Secondary | ICD-10-CM | POA: Diagnosis not present

## 2024-04-25 DIAGNOSIS — R1312 Dysphagia, oropharyngeal phase: Secondary | ICD-10-CM | POA: Diagnosis not present

## 2024-04-26 DIAGNOSIS — N400 Enlarged prostate without lower urinary tract symptoms: Secondary | ICD-10-CM | POA: Diagnosis not present

## 2024-04-26 DIAGNOSIS — F32A Depression, unspecified: Secondary | ICD-10-CM | POA: Diagnosis not present

## 2024-04-26 DIAGNOSIS — F0390 Unspecified dementia without behavioral disturbance: Secondary | ICD-10-CM | POA: Diagnosis not present

## 2024-04-26 DIAGNOSIS — G40909 Epilepsy, unspecified, not intractable, without status epilepticus: Secondary | ICD-10-CM | POA: Diagnosis not present

## 2024-04-26 DIAGNOSIS — E119 Type 2 diabetes mellitus without complications: Secondary | ICD-10-CM | POA: Diagnosis not present

## 2024-04-27 DIAGNOSIS — R1312 Dysphagia, oropharyngeal phase: Secondary | ICD-10-CM | POA: Diagnosis not present

## 2024-04-28 DIAGNOSIS — R1312 Dysphagia, oropharyngeal phase: Secondary | ICD-10-CM | POA: Diagnosis not present

## 2024-04-29 DIAGNOSIS — R1312 Dysphagia, oropharyngeal phase: Secondary | ICD-10-CM | POA: Diagnosis not present

## 2024-04-30 DIAGNOSIS — F332 Major depressive disorder, recurrent severe without psychotic features: Secondary | ICD-10-CM | POA: Diagnosis not present

## 2024-04-30 DIAGNOSIS — R1312 Dysphagia, oropharyngeal phase: Secondary | ICD-10-CM | POA: Diagnosis not present

## 2024-05-02 DIAGNOSIS — R1312 Dysphagia, oropharyngeal phase: Secondary | ICD-10-CM | POA: Diagnosis not present

## 2024-05-14 DIAGNOSIS — F332 Major depressive disorder, recurrent severe without psychotic features: Secondary | ICD-10-CM | POA: Diagnosis not present

## 2024-05-15 DIAGNOSIS — F602 Antisocial personality disorder: Secondary | ICD-10-CM | POA: Diagnosis not present

## 2024-05-15 DIAGNOSIS — F329 Major depressive disorder, single episode, unspecified: Secondary | ICD-10-CM | POA: Diagnosis not present

## 2024-05-16 DIAGNOSIS — F602 Antisocial personality disorder: Secondary | ICD-10-CM | POA: Diagnosis not present

## 2024-05-16 DIAGNOSIS — F5101 Primary insomnia: Secondary | ICD-10-CM | POA: Diagnosis not present

## 2024-05-16 DIAGNOSIS — F332 Major depressive disorder, recurrent severe without psychotic features: Secondary | ICD-10-CM | POA: Diagnosis not present

## 2024-05-16 DIAGNOSIS — F0394 Unspecified dementia, unspecified severity, with anxiety: Secondary | ICD-10-CM | POA: Diagnosis not present

## 2024-05-21 NOTE — Progress Notes (Unsigned)
 Assessment/Plan:   Seizure, by history - The nature and etiology of these has certainly not been well-defined.  The patient is at risk, but patient was started on antiepileptic medications after being in an emergency room for an event that is described as shaking while awake.  The emergency room did not witness the event, nor were they able to talk to anybody at a facility where the event was witnessed.  They noted that they started the patient on antiepileptic medication because the patient was at risk for seizure.  The patient has been on Keppra  ever since.  No EEG was done, that I can find.  The patient has had multiple subdural hematomas, but those were never symptomatic for seizure.  At this point in time, I did not change his antiepileptic medication.   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  -in SNF at [facility]  - I believe the last MBE was in 2022.  3.  Eyelid opening apraxia  - Not bothersome to patient.  Not interested in Botox. Subjective:   Brandon Vargas was seen today in neurologic consultation at the request of Subbiah, Arturo Bing *.  The consultation is for the evaluation of seizure.  The patient has previously been seen in my office, last visit over 2 years ago.  Patient has previously seen Dr. Lydia Sams as well as Dr. Jeneane Miracle as well.  Patient was diagnosed by me with PSP.  When I last saw the patient, my biggest concern was that he was living independently at [facility], and they did not think that that was a good idea.  I had personally spoke to his brother about a higher level of care (his POA) as did my Child psychotherapist at the time.  Only 2 weeks after that, the patient was admitted to the hospital with falls and progressive subdural hygromas.  The neurosurgeon felt that this was not significant, but the patient required maximal assist with ambulation and patient was discharged to SNF.   patient is now in SNF.  He has had several ER visits in 2023 and 2020 for for falls,  some of which involved more subdural hematoma.  He has previously failed levodopa  and is no longer on that agent.  As above, we were asked to reconsult with the patient because of seizure.  I have reviewed [facility] records from March, 2024 where he was sent for seizure-like activity (1 year ago).  At that point in time notes from the emergency room indicate that patient was "reportedly able to hold a conversation with staff while shaking."  Because of patient's past history of subdural hematomas, the emergency room physician felt that he was "high risk for epilepsy" and placed him on Keppra .  The emergency room physician did note that he was unable to talk to the person who witnessed the event.  The emergency room physician did note that the patient was supposed to be taking valproic acid but his valproic acid level was "negative."  He is now referred back, 1 year later, for this history of seizure.  I have notes from the nursing facility dated Apr 26, 2024.  They note that patient is seen for "ongoing medical management to include seizures."  They did not note that the patient has had any seizures.     Neuroimaging has *** previously been performed.  It *** available for my review today.  PREVIOUS MEDICATIONS: ***  ALLERGIES:   Allergies  Allergen Reactions  Metformin  And Related Swelling    CURRENT MEDICATIONS:  Outpatient Encounter Medications as of 05/23/2024  Medication Sig   ACCU-CHEK GUIDE test strip    acetaminophen  (TYLENOL ) 500 MG tablet Take 500 mg by mouth every 6 (six) hours as needed for mild pain.   cyclobenzaprine  (FLEXERIL ) 5 MG tablet Take 1 tablet (5 mg total) by mouth at bedtime as needed for muscle spasms. (Patient taking differently: Take 5 mg by mouth daily as needed for muscle spasms.)   diclofenac  Sodium (VOLTAREN ) 1 % GEL Apply 2 g topically 4 (four) times daily. To affected joint.   donepezil  (ARICEPT ) 10 MG tablet Take 1 tablet (10 mg total)  by mouth daily.   glipiZIDE  (GLUCOTROL  XL) 2.5 MG 24 hr tablet Take 2.5 mg by mouth daily with breakfast.   levETIRAcetam  (KEPPRA ) 500 MG tablet Take 500 mg by mouth 2 (two) times daily.   Menthol, Topical Analgesic, (BIOFREEZE EX) Apply 1 application. topically daily as needed (For neck pain).   mirtazapine  (REMERON ) 7.5 MG tablet Take 1 tablet (7.5 mg total) by mouth at bedtime.   pantoprazole  (PROTONIX ) 40 MG tablet Take 1 tablet (40 mg total) by mouth daily.   sertraline (ZOLOFT) 100 MG tablet Take 100 mg by mouth daily.   tamsulosin  (FLOMAX ) 0.4 MG CAPS capsule Take 0.4 mg by mouth daily.   traZODone (DESYREL) 50 MG tablet Take 50 mg by mouth at bedtime.   Vitamin D, Ergocalciferol, (DRISDOL) 1.25 MG (50000 UNIT) CAPS capsule Take 50,000 Units by mouth every 7 (seven) days. wednesday   No facility-administered encounter medications on file as of 05/23/2024.    Objective:   PHYSICAL EXAMINATION:    VITALS:  There were no vitals filed for this visit.  GEN:  Normal appears male in no acute distress.  Appears stated age. HEENT:  Normocephalic, atraumatic. The mucous membranes are moist. The superficial temporal arteries are without ropiness or tenderness. Cardiovascular: Regular rate and rhythm. Lungs: Clear to auscultation bilaterally. Neck/Heme: There are no carotid bruits noted bilaterally.  NEUROLOGICAL: Orientation:  The patient is alert and oriented x 3.   Cranial nerves: There is good facial symmetry.  Extraocular muscles are intact and visual fields are full to confrontational testing. Speech is fluent and clear. Soft palate rises symmetrically and there is no tongue deviation. Hearing is intact to conversational tone. Tone: Tone is good throughout. Sensation: Sensation is intact to light touch and pinprick throughout (facial, trunk, extremities). Vibration is intact at the bilateral big toe. There is no extinction with double simultaneous stimulation. There is no sensory  dermatomal level identified. Coordination:  The patient has no difficulty with RAM's or FNF bilaterally. Motor: Strength is 5/5 in the bilateral upper and lower extremities.  Shoulder shrug is equal and symmetric. There is no pronator drift.  There are no fasciculations noted. DTR's: Deep tendon reflexes are 2/4 at the bilateral biceps, triceps, brachioradialis, patella and achilles.  Plantar responses are downgoing bilaterally. Gait and Station: The patient is able to ambulate without difficulty. The patient is able to heel toe walk without any difficulty. The patient is able to ambulate in a tandem fashion. The patient is able to stand in the Romberg position.    Total time spent on today's visit was ***greater than 60 minutes, including both face-to-face time and nonface-to-face time.  Time included that spent on review of records (prior notes available to me/labs/imaging if pertinent), discussing treatment and goals, answering patient's questions and coordinating care.   Cc:  Rehab, [facility]

## 2024-05-23 ENCOUNTER — Ambulatory Visit (INDEPENDENT_AMBULATORY_CARE_PROVIDER_SITE_OTHER): Admitting: Neurology

## 2024-05-23 ENCOUNTER — Other Ambulatory Visit: Payer: Self-pay

## 2024-05-23 ENCOUNTER — Encounter: Payer: Self-pay | Admitting: Neurology

## 2024-05-23 ENCOUNTER — Other Ambulatory Visit (HOSPITAL_COMMUNITY): Payer: Self-pay | Admitting: *Deleted

## 2024-05-23 VITALS — BP 118/78 | HR 68 | Wt 156.4 lb

## 2024-05-23 DIAGNOSIS — R1310 Dysphagia, unspecified: Secondary | ICD-10-CM

## 2024-05-23 DIAGNOSIS — Z515 Encounter for palliative care: Secondary | ICD-10-CM | POA: Diagnosis not present

## 2024-05-23 DIAGNOSIS — G40909 Epilepsy, unspecified, not intractable, without status epilepticus: Secondary | ICD-10-CM

## 2024-05-23 DIAGNOSIS — G231 Progressive supranuclear ophthalmoplegia [Steele-Richardson-Olszewski]: Secondary | ICD-10-CM

## 2024-05-23 DIAGNOSIS — R634 Abnormal weight loss: Secondary | ICD-10-CM | POA: Diagnosis not present

## 2024-05-23 NOTE — Patient Instructions (Signed)
 Modified Barium Swallow test is scheduled for June 09th at 12:45 No restrictions ,procedure will take around hour. Go to [facility] main entrance and ask for Radiology.

## 2024-06-03 ENCOUNTER — Ambulatory Visit (HOSPITAL_COMMUNITY)
Admission: RE | Admit: 2024-06-03 | Discharge: 2024-06-03 | Disposition: A | Discharge status: Home / Self Care | Source: Ambulatory Visit | Attending: *Deleted | Admitting: *Deleted

## 2024-06-03 ENCOUNTER — Ambulatory Visit (HOSPITAL_COMMUNITY): Admission: RE | Admit: 2024-06-03 | Source: Ambulatory Visit

## 2024-06-03 DIAGNOSIS — R1310 Dysphagia, unspecified: Secondary | ICD-10-CM

## 2024-06-11 DIAGNOSIS — Z23 Encounter for immunization: Secondary | ICD-10-CM | POA: Diagnosis not present

## 2024-06-11 DIAGNOSIS — F332 Major depressive disorder, recurrent severe without psychotic features: Secondary | ICD-10-CM | POA: Diagnosis not present

## 2024-06-13 DIAGNOSIS — F332 Major depressive disorder, recurrent severe without psychotic features: Secondary | ICD-10-CM | POA: Diagnosis not present

## 2024-06-13 DIAGNOSIS — F602 Antisocial personality disorder: Secondary | ICD-10-CM | POA: Diagnosis not present

## 2024-06-13 DIAGNOSIS — F0394 Unspecified dementia, unspecified severity, with anxiety: Secondary | ICD-10-CM | POA: Diagnosis not present

## 2024-06-13 DIAGNOSIS — F5101 Primary insomnia: Secondary | ICD-10-CM | POA: Diagnosis not present

## 2024-06-17 DIAGNOSIS — M47812 Spondylosis without myelopathy or radiculopathy, cervical region: Secondary | ICD-10-CM | POA: Diagnosis not present

## 2024-06-17 DIAGNOSIS — M542 Cervicalgia: Secondary | ICD-10-CM | POA: Diagnosis not present

## 2024-06-17 DIAGNOSIS — I6523 Occlusion and stenosis of bilateral carotid arteries: Secondary | ICD-10-CM | POA: Diagnosis not present

## 2024-06-17 DIAGNOSIS — Z981 Arthrodesis status: Secondary | ICD-10-CM | POA: Diagnosis not present

## 2024-06-17 DIAGNOSIS — D649 Anemia, unspecified: Secondary | ICD-10-CM | POA: Diagnosis not present

## 2024-06-17 DIAGNOSIS — R001 Bradycardia, unspecified: Secondary | ICD-10-CM | POA: Diagnosis not present

## 2024-06-17 DIAGNOSIS — Z7401 Bed confinement status: Secondary | ICD-10-CM | POA: Diagnosis not present

## 2024-06-17 DIAGNOSIS — S199XXA Unspecified injury of neck, initial encounter: Secondary | ICD-10-CM | POA: Diagnosis not present

## 2024-06-17 DIAGNOSIS — W19XXXA Unspecified fall, initial encounter: Secondary | ICD-10-CM | POA: Diagnosis not present

## 2024-06-17 DIAGNOSIS — S0990XA Unspecified injury of head, initial encounter: Secondary | ICD-10-CM | POA: Diagnosis not present

## 2024-06-17 DIAGNOSIS — M4802 Spinal stenosis, cervical region: Secondary | ICD-10-CM | POA: Diagnosis not present

## 2024-06-17 DIAGNOSIS — S0083XA Contusion of other part of head, initial encounter: Secondary | ICD-10-CM | POA: Diagnosis not present

## 2024-06-17 DIAGNOSIS — R9082 White matter disease, unspecified: Secondary | ICD-10-CM | POA: Diagnosis not present

## 2024-06-17 NOTE — ED Triage Notes (Signed)
 Pt. Arrives via [facility] from [facility] with chief complaint of laceration to forehead post fall 2.5 hrs ago.   Pt. Reported neck pain to EMS. Hx of neck pain. C-collar in place.

## 2024-06-17 NOTE — ED Notes (Signed)
 Pt discharged back to [facility]. Transported on stretcher via [facility].    Rosina Helling [place], RN 06/17/24 [phone]

## 2024-06-17 NOTE — ED Provider Notes (Signed)
 [facility] EMERGENCY DEPARTMENT  History   Chief Complaint: fall.  History of Present Illness:  History obtained from: patient.    Brandon Vargas is a 68 y.o. male with PMHx of DM, anxiety, Lewy body dementia, SDH who presents to the ED via EMS from [facility] with complaint of laceration to forehead, post status fall prior to arrival.   Patient states he fell out of bed. Patient states he was getting out of bed to see the receptionist.  He also complains of neck pain.  He denies back pain, chest pain, abdominal pain, shortness of breath or any other complaints or injuries.   ______________________ ROS: Pertinent positives and negatives per HPI. Pertinent past medical, surgical, social and family history records were reviewed. Current Medications and Allergies were reviewed.  Physical Exam   Vitals:   06/17/24 0323 06/17/24 0420 06/17/24 0500  BP: 116/70 113/74 104/67  BP Location: Left arm Left arm   Patient Position: Lying Lying   Pulse: 56 56 56  Resp: 18 20 20   Temp: 97.5 F (36.4 C)    TempSrc: Oral    SpO2: 96% 95% 95%  Weight: 71.2 kg (156 lb 15.5 oz)    Height: 185.4 cm (6' 1)        Physical Exam Vitals and nursing note reviewed.  Constitutional:      General: He is not in acute distress.    Appearance: He is not toxic-appearing or diaphoretic.  HENT:     Head: Normocephalic.     Comments: 2 cm superficial laceration and abrasion across forehead    Right Ear: External ear normal.     Left Ear: External ear normal.     Nose: Nose normal.     Mouth/Throat:     Mouth: Mucous membranes are moist.     Pharynx: Oropharynx is clear.   Eyes:     Extraocular Movements: Extraocular movements intact.     Conjunctiva/sclera: Conjunctivae normal.     Pupils: Pupils are equal, round, and reactive to light.    Cardiovascular:     Rate and Rhythm: Normal rate and regular rhythm.     Pulses: Normal pulses.     Heart sounds: Normal  heart sounds.  Pulmonary:     Effort: Pulmonary effort is normal. No respiratory distress.     Breath sounds: Normal breath sounds. No stridor. No wheezing, rhonchi or rales.  Abdominal:     General: Abdomen is flat. Bowel sounds are normal. There is no distension.     Palpations: Abdomen is soft.     Tenderness: There is no abdominal tenderness. There is no guarding or rebound.   Musculoskeletal:        General: No signs of injury.     Cervical back: Normal range of motion. Tenderness (cervical spine) present. No rigidity.     Right lower leg: No edema.     Left lower leg: No edema.   Skin:    General: Skin is warm and dry.     Capillary Refill: Capillary refill takes less than 2 seconds.     Findings: No rash.   Neurological:     General: No focal deficit present.     Mental Status: He is alert. Mental status is at baseline.     Sensory: No sensory deficit.     Motor: No weakness.     Results   Imaging: CT Head WO Contrast  Final Result by Lonni Lemond Necessary,  MD (06/23 0420)  EXAM:  CT HEAD WITHOUT CONTRAST  06/17/2024 04:02:59 AM    TECHNIQUE:  CT of the head was performed without the administration of intravenous  contrast. Automated exposure control, iterative reconstruction, and/or   weight  based adjustment of the mA/kV was utilized to reduce the radiation dose to   as  low as reasonably achievable.    COMPARISON:  CT head without contrast 03/01/2023.    CLINICAL HISTORY:  Head trauma, minor (Age >= 65y).    FINDINGS:    BRAIN AND VENTRICLES:  No acute hemorrhage. Gray-white differentiation is preserved. No   hydrocephalus.  No extra-axial collection. No mass effect or midline shift. Mild   generalized  atrophy and white matter disease is similar to the prior exam.    ORBITS:  No acute abnormality.    SINUSES:  No acute abnormality.    SOFT TISSUES AND SKULL:  No acute soft tissue abnormality. No skull fracture. 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    VASCULATURE:  Atherosclerotic calcifications are present in the cavernous carotid   arteries  bilaterally. No hyperdense vessel is present.    IMPRESSION:  1. No acute intracranial abnormality related to minor head trauma.  2. Mild generalized atrophy and white matter disease, similar to the prior   exam.    Electronically signed by: Lonni Necessary MD 06/17/2024 04:20 AM EDT   RP  Workstation: [id number]      CT Spine Cervical WO Contrast  Final Result by Lonni Lemond Necessary, MD (06/23 0424)  EXAM:  CT CERVICAL SPINE WITHOUT CONTRAST  06/17/2024 04:02:59 AM    TECHNIQUE:  CT of the cervical was performed without the administration of intravenous  contrast. Multiplanar reformatted images are provided for review.   Automated  exposure control, iterative reconstruction, and/or weight based adjustment   of  the mA/kV was utilized to reduce the radiation dose to as low as   reasonably  achievable.    COMPARISON:  CT of the cervical spine 09/17/2023.    CLINICAL HISTORY:  Neck trauma (Age >= 65y).    FINDINGS:    CERVICAL SPINE:    BONES AND ALIGNMENT:  No acute fracture or traumatic subluxation is present. Fused anterior  osteophytes are again noted C5-7. Reversal of the normal cervical lordosis   is  present. Ankylosis of the posterior elements is noted at C2-3 and C3-4.  Laminectomies are present C3-6.    DEGENERATIVE CHANGES:  Severe foraminal stenosis is present bilaterally at C3-4 and C4-5 in   particular.    SOFT TISSUES:  No prevertebral soft tissue swelling.    IMPRESSION:  1. No acute fracture or traumatic subluxation.  2. Severe bilateral foraminal stenosis at C3-4 and C4-5.  3. Sequelae of prior surgery and multilevel degenerative changes are   stable.    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 _________________________  Brandon Vargas is a 68 y.o. male who was seen in the ED for traumatic injuries sustained during mechanical fall.  The following differentials were considered: Traumatic ICH, skull fracture, facial bone fracture, or cervical vertebral fracture.  Pertinent studies were obtained, with results listed in chart above.  CT imaging studies do not reveal any significant  acute pathology.  At this time, patient has no findings to warrant further workup or hospital admission.  He is appropriate for discharge back to nursing facility, with PCP follow-up.  Strict return precautions regarding this ED visit were discussed.    ED Clinical Impression   1. Fall, initial encounter   2. Contusion of forehead, initial encounter     ED Disposition   ED Disposition     ED Disposition  Discharge   Condition  Stable   Comment  --         _________________ Scribe's Attestation: Dr. Zackowski obtained and performed the history, physical exam and medical decision making elements that were entered into the chart. Documentation assistance was provided by me personally, a scribe. Signed by Asberry Opal, Scribe on 06/22/2024 5:33 AM  Documentation assistance provided by the scribe. I was present during the time the encounter was recorded. The information recorded by the scribe was done at my direction and has been reviewed and validated by me. William Zackowski, DO  (Please note that portions of this note have been completed with Dragon voice recognition software.  Efforts were made to correct any errors, but occasionally words are mis-transcribed.)

## 2024-06-18 DIAGNOSIS — S0191XA Laceration without foreign body of unspecified part of head, initial encounter: Secondary | ICD-10-CM | POA: Diagnosis not present

## 2024-06-18 DIAGNOSIS — R296 Repeated falls: Secondary | ICD-10-CM | POA: Diagnosis not present

## 2024-06-28 DIAGNOSIS — F32A Depression, unspecified: Secondary | ICD-10-CM | POA: Diagnosis not present

## 2024-06-28 DIAGNOSIS — G40909 Epilepsy, unspecified, not intractable, without status epilepticus: Secondary | ICD-10-CM | POA: Diagnosis not present

## 2024-06-28 DIAGNOSIS — F0390 Unspecified dementia without behavioral disturbance: Secondary | ICD-10-CM | POA: Diagnosis not present

## 2024-06-28 DIAGNOSIS — R296 Repeated falls: Secondary | ICD-10-CM | POA: Diagnosis not present

## 2024-06-28 DIAGNOSIS — N400 Enlarged prostate without lower urinary tract symptoms: Secondary | ICD-10-CM | POA: Diagnosis not present

## 2024-07-02 DIAGNOSIS — R296 Repeated falls: Secondary | ICD-10-CM | POA: Diagnosis not present

## 2024-07-02 DIAGNOSIS — G40909 Epilepsy, unspecified, not intractable, without status epilepticus: Secondary | ICD-10-CM | POA: Diagnosis not present

## 2024-07-03 DIAGNOSIS — I10 Essential (primary) hypertension: Secondary | ICD-10-CM | POA: Diagnosis not present

## 2024-07-08 DIAGNOSIS — F332 Major depressive disorder, recurrent severe without psychotic features: Secondary | ICD-10-CM | POA: Diagnosis not present

## 2024-07-11 DIAGNOSIS — F5101 Primary insomnia: Secondary | ICD-10-CM | POA: Diagnosis not present

## 2024-07-11 DIAGNOSIS — F0394 Unspecified dementia, unspecified severity, with anxiety: Secondary | ICD-10-CM | POA: Diagnosis not present

## 2024-07-11 DIAGNOSIS — F602 Antisocial personality disorder: Secondary | ICD-10-CM | POA: Diagnosis not present

## 2024-07-11 DIAGNOSIS — F332 Major depressive disorder, recurrent severe without psychotic features: Secondary | ICD-10-CM | POA: Diagnosis not present

## 2024-07-31 DIAGNOSIS — F0390 Unspecified dementia without behavioral disturbance: Secondary | ICD-10-CM | POA: Diagnosis not present

## 2024-07-31 DIAGNOSIS — R296 Repeated falls: Secondary | ICD-10-CM | POA: Diagnosis not present

## 2024-07-31 DIAGNOSIS — F32A Depression, unspecified: Secondary | ICD-10-CM | POA: Diagnosis not present

## 2024-07-31 DIAGNOSIS — G40909 Epilepsy, unspecified, not intractable, without status epilepticus: Secondary | ICD-10-CM | POA: Diagnosis not present

## 2024-07-31 DIAGNOSIS — N400 Enlarged prostate without lower urinary tract symptoms: Secondary | ICD-10-CM | POA: Diagnosis not present

## 2024-08-05 DIAGNOSIS — G40909 Epilepsy, unspecified, not intractable, without status epilepticus: Secondary | ICD-10-CM | POA: Diagnosis not present

## 2024-08-05 DIAGNOSIS — R296 Repeated falls: Secondary | ICD-10-CM | POA: Diagnosis not present

## 2024-08-05 DIAGNOSIS — F0390 Unspecified dementia without behavioral disturbance: Secondary | ICD-10-CM | POA: Diagnosis not present

## 2024-08-06 DIAGNOSIS — S0181XA Laceration without foreign body of other part of head, initial encounter: Secondary | ICD-10-CM | POA: Diagnosis not present

## 2024-08-06 DIAGNOSIS — Z7401 Bed confinement status: Secondary | ICD-10-CM | POA: Diagnosis not present

## 2024-08-06 DIAGNOSIS — S0101XA Laceration without foreign body of scalp, initial encounter: Secondary | ICD-10-CM | POA: Diagnosis not present

## 2024-08-06 DIAGNOSIS — Z9181 History of falling: Secondary | ICD-10-CM | POA: Diagnosis not present

## 2024-08-06 DIAGNOSIS — R937 Abnormal findings on diagnostic imaging of other parts of musculoskeletal system: Secondary | ICD-10-CM | POA: Diagnosis not present

## 2024-08-06 DIAGNOSIS — M2578 Osteophyte, vertebrae: Secondary | ICD-10-CM | POA: Diagnosis not present

## 2024-08-06 DIAGNOSIS — S0990XA Unspecified injury of head, initial encounter: Secondary | ICD-10-CM | POA: Diagnosis not present

## 2024-08-06 DIAGNOSIS — W19XXXA Unspecified fall, initial encounter: Secondary | ICD-10-CM | POA: Diagnosis not present

## 2024-08-06 DIAGNOSIS — R4182 Altered mental status, unspecified: Secondary | ICD-10-CM | POA: Diagnosis not present

## 2024-08-06 DIAGNOSIS — R457 State of emotional shock and stress, unspecified: Secondary | ICD-10-CM | POA: Diagnosis not present

## 2024-08-08 DIAGNOSIS — R296 Repeated falls: Secondary | ICD-10-CM | POA: Diagnosis not present

## 2024-08-08 DIAGNOSIS — F32A Depression, unspecified: Secondary | ICD-10-CM | POA: Diagnosis not present

## 2024-08-08 DIAGNOSIS — G40909 Epilepsy, unspecified, not intractable, without status epilepticus: Secondary | ICD-10-CM | POA: Diagnosis not present

## 2024-08-08 DIAGNOSIS — F332 Major depressive disorder, recurrent severe without psychotic features: Secondary | ICD-10-CM | POA: Diagnosis not present

## 2024-08-08 DIAGNOSIS — F5101 Primary insomnia: Secondary | ICD-10-CM | POA: Diagnosis not present

## 2024-08-08 DIAGNOSIS — F0394 Unspecified dementia, unspecified severity, with anxiety: Secondary | ICD-10-CM | POA: Diagnosis not present

## 2024-08-08 DIAGNOSIS — F602 Antisocial personality disorder: Secondary | ICD-10-CM | POA: Diagnosis not present

## 2024-08-08 DIAGNOSIS — E119 Type 2 diabetes mellitus without complications: Secondary | ICD-10-CM | POA: Diagnosis not present

## 2024-08-20 DIAGNOSIS — G40909 Epilepsy, unspecified, not intractable, without status epilepticus: Secondary | ICD-10-CM | POA: Diagnosis not present

## 2024-08-20 DIAGNOSIS — N400 Enlarged prostate without lower urinary tract symptoms: Secondary | ICD-10-CM | POA: Diagnosis not present

## 2024-08-20 DIAGNOSIS — R296 Repeated falls: Secondary | ICD-10-CM | POA: Diagnosis not present

## 2024-08-20 DIAGNOSIS — F0390 Unspecified dementia without behavioral disturbance: Secondary | ICD-10-CM | POA: Diagnosis not present

## 2024-08-20 DIAGNOSIS — E559 Vitamin D deficiency, unspecified: Secondary | ICD-10-CM | POA: Diagnosis not present

## 2024-09-02 DIAGNOSIS — R0902 Hypoxemia: Secondary | ICD-10-CM | POA: Diagnosis not present

## 2024-09-03 DIAGNOSIS — R627 Adult failure to thrive: Secondary | ICD-10-CM | POA: Diagnosis not present

## 2024-09-03 DIAGNOSIS — R0902 Hypoxemia: Secondary | ICD-10-CM | POA: Diagnosis not present

## 2024-09-04 DIAGNOSIS — G231 Progressive supranuclear ophthalmoplegia [Steele-Richardson-Olszewski]: Secondary | ICD-10-CM | POA: Diagnosis not present

## 2024-09-05 DIAGNOSIS — F332 Major depressive disorder, recurrent severe without psychotic features: Secondary | ICD-10-CM | POA: Diagnosis not present

## 2024-09-05 DIAGNOSIS — F0394 Unspecified dementia, unspecified severity, with anxiety: Secondary | ICD-10-CM | POA: Diagnosis not present

## 2024-09-05 DIAGNOSIS — F5101 Primary insomnia: Secondary | ICD-10-CM | POA: Diagnosis not present

## 2024-09-05 DIAGNOSIS — F602 Antisocial personality disorder: Secondary | ICD-10-CM | POA: Diagnosis not present

## 2024-09-08 ENCOUNTER — Emergency Department (HOSPITAL_COMMUNITY)

## 2024-09-08 ENCOUNTER — Other Ambulatory Visit: Payer: Self-pay

## 2024-09-08 ENCOUNTER — Emergency Department (HOSPITAL_COMMUNITY)
Admission: EM | Admit: 2024-09-08 | Discharge: 2024-09-09 | Disposition: A | Discharge status: Home / Self Care | Attending: Emergency Medicine | Admitting: Emergency Medicine

## 2024-09-08 DIAGNOSIS — W1830XA Fall on same level, unspecified, initial encounter: Secondary | ICD-10-CM | POA: Diagnosis not present

## 2024-09-08 DIAGNOSIS — S0101XA Laceration without foreign body of scalp, initial encounter: Secondary | ICD-10-CM | POA: Insufficient documentation

## 2024-09-08 DIAGNOSIS — R9089 Other abnormal findings on diagnostic imaging of central nervous system: Secondary | ICD-10-CM | POA: Diagnosis not present

## 2024-09-08 DIAGNOSIS — R531 Weakness: Secondary | ICD-10-CM | POA: Diagnosis not present

## 2024-09-08 DIAGNOSIS — F0390 Unspecified dementia without behavioral disturbance: Secondary | ICD-10-CM | POA: Insufficient documentation

## 2024-09-08 DIAGNOSIS — R5383 Other fatigue: Secondary | ICD-10-CM | POA: Diagnosis not present

## 2024-09-08 DIAGNOSIS — W19XXXA Unspecified fall, initial encounter: Secondary | ICD-10-CM | POA: Diagnosis not present

## 2024-09-08 DIAGNOSIS — Y92129 Unspecified place in nursing home as the place of occurrence of the external cause: Secondary | ICD-10-CM | POA: Insufficient documentation

## 2024-09-08 DIAGNOSIS — Z043 Encounter for examination and observation following other accident: Secondary | ICD-10-CM | POA: Diagnosis not present

## 2024-09-08 DIAGNOSIS — S199XXA Unspecified injury of neck, initial encounter: Secondary | ICD-10-CM | POA: Diagnosis not present

## 2024-09-08 DIAGNOSIS — R9431 Abnormal electrocardiogram [ECG] [EKG]: Secondary | ICD-10-CM | POA: Diagnosis not present

## 2024-09-08 DIAGNOSIS — R410 Disorientation, unspecified: Secondary | ICD-10-CM | POA: Diagnosis not present

## 2024-09-08 DIAGNOSIS — I700 Atherosclerosis of aorta: Secondary | ICD-10-CM | POA: Diagnosis not present

## 2024-09-08 DIAGNOSIS — S0990XA Unspecified injury of head, initial encounter: Secondary | ICD-10-CM | POA: Diagnosis not present

## 2024-09-08 NOTE — ED Provider Notes (Signed)
 [place] EMERGENCY DEPARTMENT AT [facility] Provider Note   CSN: [id number] Arrival date & time: 09/08/24  [id number]     Patient presents with: Brandon Vargas is a 68 y.o. male.   Brought to the emergency department for evaluation after a fall at skilled nursing facility.  Patient with dementia, reportedly at mental status baseline per nursing home staff.  Patient with laceration to right forehead.       Prior to Admission medications   Medication Sig Start Date End Date Taking? Authorizing Provider  ACCU-CHEK GUIDE test strip  05/15/20   [provider]  acetaminophen  (TYLENOL ) 500 MG tablet Take 500 mg by mouth every 6 (six) hours as needed for mild pain.    [provider]  cyclobenzaprine  (FLEXERIL ) 5 MG tablet Take 1 tablet (5 mg total) by mouth at bedtime as needed for muscle spasms. Patient taking differently: Take 5 mg by mouth daily as needed for muscle spasms. 02/01/22   de [place], Raymond J, MD  diclofenac  Sodium (VOLTAREN ) 1 % GEL Apply 2 g topically 4 (four) times daily. To affected joint. 05/05/22   [place], Raymond J, MD  donepezil  (ARICEPT ) 10 MG tablet Take 1 tablet (10 mg total) by mouth daily. 12/06/21   [place], Raymond J, MD  glipiZIDE  (GLUCOTROL  XL) 2.5 MG 24 hr tablet Take 2.5 mg by mouth daily with breakfast. 09/09/22   [provider]  levETIRAcetam  (KEPPRA ) 500 MG tablet Take 500 mg by mouth 2 (two) times daily. 03/01/23   [provider]  Menthol, Topical Analgesic, (BIOFREEZE EX) Apply 1 application. topically daily as needed (For neck pain).    [provider]  mirtazapine  (REMERON ) 7.5 MG tablet Take 1 tablet (7.5 mg total) by mouth at bedtime. 07/30/23   Mardy Legacy, NP  pantoprazole  (PROTONIX ) 40 MG tablet Take 1 tablet (40 mg total) by mouth daily. 02/22/22   de [place], Quintin PARAS, MD  sertraline (ZOLOFT) 100 MG tablet Take 100 mg by mouth daily. 07/27/23   [provider]  tamsulosin  (FLOMAX )  0.4 MG CAPS capsule Take 0.4 mg by mouth daily.    [provider]  traZODone (DESYREL) 50 MG tablet Take 50 mg by mouth at bedtime. 07/20/23   [provider]  Vitamin D, Ergocalciferol, (DRISDOL) 1.25 MG (50000 UNIT) CAPS capsule Take 50,000 Units by mouth every 7 (seven) days. wednesday    [provider]    Allergies: Metformin  and related    Review of Systems  Updated Vital Signs BP 109/64 (BP Location: Left Arm)   Pulse 65   Temp 97.7 F (36.5 C) (Oral)   Resp 18   SpO2 100%   Physical Exam Vitals and nursing note reviewed.  Constitutional:      General: He is not in acute distress.    Appearance: He is well-developed.  HENT:     Head: Normocephalic. Laceration present.      Mouth/Throat:     Mouth: Mucous membranes are moist.  Eyes:     General: Vision grossly intact. Gaze aligned appropriately.     Extraocular Movements: Extraocular movements intact.     Conjunctiva/sclera: Conjunctivae normal.  Cardiovascular:     Rate and Rhythm: Normal rate and regular rhythm.     Pulses: Normal pulses.     Heart sounds: Normal heart sounds, S1 normal and S2 normal. No murmur heard.    No friction rub. No gallop.  Pulmonary:  Effort: Pulmonary effort is normal. No respiratory distress.     Breath sounds: Normal breath sounds.  Abdominal:     Palpations: Abdomen is soft.     Tenderness: There is no abdominal tenderness. There is no guarding or rebound.     Hernia: No hernia is present.  Musculoskeletal:        General: No swelling.     Cervical back: Full passive range of motion without pain, normal range of motion and neck supple. No pain with movement, spinous process tenderness or muscular tenderness. Normal range of motion.     Right lower leg: No edema.     Left lower leg: No edema.  Skin:    General: Skin is warm and dry.     Capillary Refill: Capillary refill takes less than 2 seconds.     Findings: No ecchymosis, erythema, lesion or  wound.  Neurological:     Mental Status: Mental status is at baseline. He is confused.     GCS: GCS eye subscore is 4. GCS verbal subscore is 5. GCS motor subscore is 6.     Coordination: Coordination is intact.     Comments: Baseline left hemiparesis noted     (all labs ordered are listed, but only abnormal results are displayed) Labs Reviewed  BASIC METABOLIC PANEL WITH GFR - Abnormal; Notable for the following components:      Result Value   Glucose, Bld 128 (*)    Calcium 8.6 (*)    All other components within normal limits  URINALYSIS, ROUTINE W REFLEX MICROSCOPIC - Abnormal; Notable for the following components:   Specific Gravity, Urine 1.033 (*)    Hgb urine dipstick MODERATE (*)    Ketones, ur 20 (*)    All other components within normal limits  CBC WITH DIFFERENTIAL/PLATELET    EKG: EKG Interpretation Date/Time:  Sunday September 08 2024 23:46:35 EDT Ventricular Rate:  62 PR Interval:  182 QRS Duration:  76 QT Interval:  418 QTC Calculation: 424 R Axis:   -8  Text Interpretation: Normal sinus rhythm Normal ECG Confirmed by Haze Lonni PARAS [phone]) on 09/08/2024 11:56:14 PM  Radiology: CT HEAD WO CONTRAST ( ) Result Date: 09/09/2024 CLINICAL DATA:  Head trauma, minor (Age >= 65y); Neck trauma (Age >= 65y). Fall EXAM: CT HEAD WITHOUT CONTRAST CT CERVICAL SPINE WITHOUT CONTRAST TECHNIQUE: Multidetector CT imaging of the head and cervical spine was performed following the standard protocol without intravenous contrast. Multiplanar CT image reconstructions of the cervical spine were also generated. RADIATION DOSE REDUCTION: This exam was performed according to the departmental dose-optimization program which includes automated exposure control, adjustment of the mA and/or kV according to patient size and/or use of iterative reconstruction technique. COMPARISON:  CT head and C-spine 09/17/2023 FINDINGS: CT HEAD FINDINGS Brain: Cerebral ventricle sizes are concordant  with the degree of cerebral volume loss. Patchy and confluent areas of decreased attenuation are noted throughout the deep and periventricular white matter of the cerebral hemispheres bilaterally, compatible with chronic microvascular ischemic disease. No evidence of large-territorial acute infarction. No parenchymal hemorrhage. No mass lesion. No extra-axial collection. No mass effect or midline shift. No hydrocephalus. Basilar cisterns are patent. Vascular: No hyperdense vessel. Skull: No acute fracture or focal lesion. Sinuses/Orbits: Paranasal sinuses and mastoid air cells are clear. The orbits are unremarkable. Other: None. CT CERVICAL SPINE FINDINGS Alignment: Reversal of normal cervical lordosis likely due to positioning. Skull base and vertebrae: Multilevel anterior osteophyte formation that are continuous. Multilevel moderate to severe  degenerative change of the spine. Associated multilevel severe osseous neural foraminal stenosis on the right. No severe osseous central canal stenosis. No acute fracture. No aggressive appearing focal osseous lesion or focal pathologic process. Soft tissues and spinal canal: No prevertebral fluid or swelling. No visible canal hematoma. Upper chest: Unremarkable. Other: None. IMPRESSION: 1. No acute intracranial abnormality. 2. No acute displaced fracture or traumatic listhesis of the cervical spine. 3. Multilevel anterior osteophyte formation that are continuous. Multilevel moderate to severe degenerative change of the spine. Associated multilevel severe osseous neural foraminal stenosis on the right. No severe osseous central canal stenosis. Electronically Signed   By: Morgane  Naveau M.D.   On: 09/09/2024 00:27   CT CERVICAL SPINE WO CONTRAST Result Date: 09/09/2024 CLINICAL DATA:  Head trauma, minor (Age >= 65y); Neck trauma (Age >= 65y). Fall EXAM: CT HEAD WITHOUT CONTRAST CT CERVICAL SPINE WITHOUT CONTRAST TECHNIQUE: Multidetector CT imaging of the head and cervical  spine was performed following the standard protocol without intravenous contrast. Multiplanar CT image reconstructions of the cervical spine were also generated. RADIATION DOSE REDUCTION: This exam was performed according to the departmental dose-optimization program which includes automated exposure control, adjustment of the mA and/or kV according to patient size and/or use of iterative reconstruction technique. COMPARISON:  CT head and C-spine 09/17/2023 FINDINGS: CT HEAD FINDINGS Brain: Cerebral ventricle sizes are concordant with the degree of cerebral volume loss. Patchy and confluent areas of decreased attenuation are noted throughout the deep and periventricular white matter of the cerebral hemispheres bilaterally, compatible with chronic microvascular ischemic disease. No evidence of large-territorial acute infarction. No parenchymal hemorrhage. No mass lesion. No extra-axial collection. No mass effect or midline shift. No hydrocephalus. Basilar cisterns are patent. Vascular: No hyperdense vessel. Skull: No acute fracture or focal lesion. Sinuses/Orbits: Paranasal sinuses and mastoid air cells are clear. The orbits are unremarkable. Other: None. CT CERVICAL SPINE FINDINGS Alignment: Reversal of normal cervical lordosis likely due to positioning. Skull base and vertebrae: Multilevel anterior osteophyte formation that are continuous. Multilevel moderate to severe degenerative change of the spine. Associated multilevel severe osseous neural foraminal stenosis on the right. No severe osseous central canal stenosis. No acute fracture. No aggressive appearing focal osseous lesion or focal pathologic process. Soft tissues and spinal canal: No prevertebral fluid or swelling. No visible canal hematoma. Upper chest: Unremarkable. Other: None. IMPRESSION: 1. No acute intracranial abnormality. 2. No acute displaced fracture or traumatic listhesis of the cervical spine. 3. Multilevel anterior osteophyte formation that  are continuous. Multilevel moderate to severe degenerative change of the spine. Associated multilevel severe osseous neural foraminal stenosis on the right. No severe osseous central canal stenosis. Electronically Signed   By: Morgane  Naveau M.D.   On: 09/09/2024 00:27   DG Pelvis 1-2 Views Result Date: 09/09/2024 CLINICAL DATA:  fall EXAM: PELVIS - 1-2 VIEW COMPARISON:  None Available. FINDINGS: There is no evidence of pelvic fracture or diastasis. No acute displaced fracture or dislocation of either hips. No pelvic bone lesions are seen. IMPRESSION: Negative. Electronically Signed   By: Morgane  Naveau M.D.   On: 09/09/2024 00:09   DG Chest 1 View Result Date: 09/09/2024 CLINICAL DATA:  fall multiple falls this evening / pt lethargic and confused EXAM: CHEST  1 VIEW COMPARISON:  Chest x-ray 11/14/2022, CT chest 11/14/2022 FINDINGS: The heart and mediastinal contours are unchanged. Atherosclerotic plaque. No focal consolidation. No pulmonary edema. No pleural effusion. No pneumothorax. No acute osseous abnormality. IMPRESSION: 1. No active disease. 2.  Aortic Atherosclerosis (ICD10-I70.0). Electronically Signed   By: Morgane  Naveau M.D.   On: 09/09/2024 00:06     .Laceration Repair  Date/Time: 09/09/2024 2:50 AM  Performed by: Haze Lonni PARAS, MD Authorized by: Haze Lonni PARAS, MD   Consent:    Consent obtained:  Emergent situation Universal protocol:    Site/side marked: yes     Immediately prior to procedure, a time out was called: yes     Patient identity confirmed:  Hospital-assigned identification number Anesthesia:    Anesthesia method:  None Laceration details:    Location:  Scalp   Scalp location:  Frontal   Length (cm):  1.5 Exploration:    Contaminated: no   Treatment:    Area cleansed with:  Chlorhexidine   Irrigation solution:  Sterile saline   Irrigation method:  Syringe Skin repair:    Repair method:  Tissue adhesive Approximation:    Approximation:   Close Repair type:    Repair type:  Simple Post-procedure details:    Dressing:  Open (no dressing)   Procedure completion:  Tolerated well, no immediate complications    Medications Ordered in the ED - No data to display                                  Medical Decision Making Amount and/or Complexity of Data Reviewed External Data Reviewed: labs, ECG and notes. Labs: ordered. Decision-making details documented in ED Course. Radiology: ordered and independent interpretation performed. Decision-making details documented in ED Course. ECG/medicine tests: ordered and independent interpretation performed. Decision-making details documented in ED Course.   Differential diagnosis considered includes, but not limited to: Blunt trauma including intracranial injury, spinal injury, thoracic injury, intra-abdominal and retroperitoneal injury, orthopedic injury  Presents to the emergency department for evaluation after a fall.  He reportedly has fallen multiple times at the nursing home.  Reviewing his records reveals he does have a history of epilepsy, no seizure reported.  Patient also has history of multiple falls in the past.  Patient with superficial laceration to the right forehead.  No other obvious injuries.  X-ray of chest and pelvis unremarkable.  Patient with hemiparesis of the left side which is baseline, no deformities noted on extremities.  CT head and cervical spine without acute injury.  Basic lab work unremarkable.  Urinalysis without signs of infection.  Workup has been reassuring, appropriate for return to nursing home.     Final diagnoses:  Fall, initial encounter  Laceration of scalp, initial encounter    ED Discharge Orders     None          Petar Mucci, Lonni PARAS, MD 09/09/24 [phone]

## 2024-09-08 NOTE — ED Triage Notes (Signed)
 Patient BIB [facility] coming from SNF due to multiple falls this evening reported by staff. Patient not on thinners, no LOC reported. Visible laceration on R forehead. Per facility patient is A&Ox4 at baseline with slow responses. Patient lethargic at arrival and confused.  EMS VS: 110/70 BP 60 HR 97% RA CBG 154

## 2024-09-09 ENCOUNTER — Emergency Department (HOSPITAL_COMMUNITY)

## 2024-09-09 DIAGNOSIS — S199XXA Unspecified injury of neck, initial encounter: Secondary | ICD-10-CM | POA: Diagnosis not present

## 2024-09-09 DIAGNOSIS — S0990XA Unspecified injury of head, initial encounter: Secondary | ICD-10-CM | POA: Diagnosis not present

## 2024-09-09 DIAGNOSIS — R9089 Other abnormal findings on diagnostic imaging of central nervous system: Secondary | ICD-10-CM | POA: Diagnosis not present

## 2024-09-09 DIAGNOSIS — R531 Weakness: Secondary | ICD-10-CM | POA: Diagnosis not present

## 2024-09-09 DIAGNOSIS — S0101XA Laceration without foreign body of scalp, initial encounter: Secondary | ICD-10-CM | POA: Diagnosis not present

## 2024-09-09 DIAGNOSIS — Z7401 Bed confinement status: Secondary | ICD-10-CM | POA: Diagnosis not present

## 2024-09-09 LAB — CBC WITH DIFFERENTIAL/PLATELET
Abs Immature Granulocytes: 0.03 K/uL (ref 0.00–0.07)
Basophils Absolute: 0 K/uL (ref 0.0–0.1)
Basophils Relative: 0 %
Eosinophils Absolute: 0.1 K/uL (ref 0.0–0.5)
Eosinophils Relative: 1 %
HCT: 42.4 % (ref 39.0–52.0)
Hemoglobin: 14.5 g/dL (ref 13.0–17.0)
Immature Granulocytes: 0 %
Lymphocytes Relative: 18 %
Lymphs Abs: 1.7 K/uL (ref 0.7–4.0)
MCH: 28.8 pg (ref 26.0–34.0)
MCHC: 34.2 g/dL (ref 30.0–36.0)
MCV: 84.1 fL (ref 80.0–100.0)
Monocytes Absolute: 0.5 K/uL (ref 0.1–1.0)
Monocytes Relative: 5 %
Neutro Abs: 7.2 K/uL (ref 1.7–7.7)
Neutrophils Relative %: 76 %
Platelets: 211 K/uL (ref 150–400)
RBC: 5.04 MIL/uL (ref 4.22–5.81)
RDW: 12.7 % (ref 11.5–15.5)
WBC: 9.5 K/uL (ref 4.0–10.5)
nRBC: 0 % (ref 0.0–0.2)

## 2024-09-09 LAB — URINALYSIS, ROUTINE W REFLEX MICROSCOPIC
Bacteria, UA: NONE SEEN
Bilirubin Urine: NEGATIVE
Glucose, UA: NEGATIVE mg/dL
Ketones, ur: 20 mg/dL — AB
Leukocytes,Ua: NEGATIVE
Nitrite: NEGATIVE
Protein, ur: NEGATIVE mg/dL
RBC / HPF: >50 RBC/hpf (ref 0–5)
Specific Gravity, Urine: 1.033 — ABNORMAL HIGH (ref 1.005–1.030)
pH: 5 (ref 5.0–8.0)

## 2024-09-09 LAB — BASIC METABOLIC PANEL WITH GFR
Anion gap: 10 (ref 5–15)
BUN: 22 mg/dL (ref 8–23)
CO2: 24 mmol/L (ref 22–32)
Calcium: 8.6 mg/dL — ABNORMAL LOW (ref 8.9–10.3)
Chloride: 102 mmol/L (ref 98–111)
Creatinine, Ser: 0.86 mg/dL (ref 0.61–1.24)
GFR, Estimated: >60 mL/min (ref >60)
Glucose, Bld: 128 mg/dL — ABNORMAL HIGH (ref 70–99)
Potassium: 3.9 mmol/L (ref 3.5–5.1)
Sodium: 136 mmol/L (ref 135–145)

## 2024-09-09 NOTE — ED Notes (Signed)
 Report given to [employer] and rehab SNF , [facility] notified by [employer]omatic [employer] operational officer for transport .

## 2024-10-03 DIAGNOSIS — F5101 Primary insomnia: Secondary | ICD-10-CM | POA: Diagnosis not present

## 2024-10-03 DIAGNOSIS — F0394 Unspecified dementia, unspecified severity, with anxiety: Secondary | ICD-10-CM | POA: Diagnosis not present

## 2024-10-03 DIAGNOSIS — F602 Antisocial personality disorder: Secondary | ICD-10-CM | POA: Diagnosis not present

## 2024-10-03 DIAGNOSIS — F332 Major depressive disorder, recurrent severe without psychotic features: Secondary | ICD-10-CM | POA: Diagnosis not present

## 2024-10-26 DEATH — deceased
